# Patient Record
Sex: Female | Born: 1945 | Race: Black or African American | Hispanic: No | Smoking: Former smoker
Health system: Southern US, Community
[De-identification: ages and names within clinical notes are randomized; demographics above are authoritative.]

## PROBLEM LIST (undated history)

## (undated) DIAGNOSIS — I2510 Atherosclerotic heart disease of native coronary artery without angina pectoris: Secondary | ICD-10-CM

## (undated) DIAGNOSIS — G459 Transient cerebral ischemic attack, unspecified: Secondary | ICD-10-CM

## (undated) DIAGNOSIS — N183 Chronic kidney disease, stage 3 unspecified: Secondary | ICD-10-CM

## (undated) DIAGNOSIS — G4730 Sleep apnea, unspecified: Secondary | ICD-10-CM

## (undated) DIAGNOSIS — I214 Non-ST elevation (NSTEMI) myocardial infarction: Secondary | ICD-10-CM

## (undated) DIAGNOSIS — R252 Cramp and spasm: Secondary | ICD-10-CM

## (undated) DIAGNOSIS — I10 Essential (primary) hypertension: Secondary | ICD-10-CM

## (undated) DIAGNOSIS — E782 Mixed hyperlipidemia: Secondary | ICD-10-CM

## (undated) DIAGNOSIS — I639 Cerebral infarction, unspecified: Secondary | ICD-10-CM

## (undated) DIAGNOSIS — M1990 Unspecified osteoarthritis, unspecified site: Secondary | ICD-10-CM

## (undated) DIAGNOSIS — E119 Type 2 diabetes mellitus without complications: Secondary | ICD-10-CM

## (undated) DIAGNOSIS — I209 Angina pectoris, unspecified: Secondary | ICD-10-CM

## (undated) HISTORY — PX: KNEE ARTHROPLASTY: SHX992

## (undated) HISTORY — DX: Mixed hyperlipidemia: E78.2

## (undated) HISTORY — DX: Non-ST elevation (NSTEMI) myocardial infarction: I21.4

## (undated) HISTORY — DX: Essential (primary) hypertension: I10

## (undated) HISTORY — DX: Transient cerebral ischemic attack, unspecified: G45.9

## (undated) HISTORY — DX: Chronic kidney disease, stage 3 unspecified: N18.30

## (undated) HISTORY — PX: CHOLECYSTECTOMY: SHX55

## (undated) HISTORY — DX: Atherosclerotic heart disease of native coronary artery without angina pectoris: I25.10

## (undated) HISTORY — DX: Type 2 diabetes mellitus without complications: E11.9

## (undated) HISTORY — PX: EYE SURGERY: SHX253

## (undated) HISTORY — DX: Chronic kidney disease, stage 3 (moderate): N18.3

## (undated) HISTORY — PX: OTHER SURGICAL HISTORY: SHX169

---
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
## 2010-08-14 DIAGNOSIS — R0602 Shortness of breath: Secondary | ICD-10-CM

## 2010-11-16 DIAGNOSIS — R079 Chest pain, unspecified: Secondary | ICD-10-CM

## 2010-11-16 DIAGNOSIS — I200 Unstable angina: Secondary | ICD-10-CM

## 2010-11-17 DIAGNOSIS — I219 Acute myocardial infarction, unspecified: Secondary | ICD-10-CM

## 2010-12-02 ENCOUNTER — Encounter: Payer: Self-pay | Admitting: *Deleted

## 2010-12-04 ENCOUNTER — Encounter: Payer: No Typology Code available for payment source | Admitting: Cardiovascular Disease

## 2010-12-07 ENCOUNTER — Encounter: Payer: Self-pay | Admitting: Cardiovascular Disease

## 2010-12-18 ENCOUNTER — Encounter: Payer: Self-pay | Admitting: Cardiovascular Disease

## 2010-12-18 ENCOUNTER — Ambulatory Visit (INDEPENDENT_AMBULATORY_CARE_PROVIDER_SITE_OTHER): Payer: No Typology Code available for payment source | Admitting: Physician Assistant

## 2010-12-18 VITALS — BP 111/67 | HR 78 | Ht 66.0 in | Wt 242.0 lb

## 2010-12-18 DIAGNOSIS — E119 Type 2 diabetes mellitus without complications: Secondary | ICD-10-CM | POA: Insufficient documentation

## 2010-12-18 DIAGNOSIS — E785 Hyperlipidemia, unspecified: Secondary | ICD-10-CM | POA: Insufficient documentation

## 2010-12-18 DIAGNOSIS — N189 Chronic kidney disease, unspecified: Secondary | ICD-10-CM

## 2010-12-18 DIAGNOSIS — I10 Essential (primary) hypertension: Secondary | ICD-10-CM

## 2010-12-18 DIAGNOSIS — D649 Anemia, unspecified: Secondary | ICD-10-CM | POA: Insufficient documentation

## 2010-12-18 DIAGNOSIS — I2510 Atherosclerotic heart disease of native coronary artery without angina pectoris: Secondary | ICD-10-CM

## 2010-12-18 DIAGNOSIS — Z79899 Other long term (current) drug therapy: Secondary | ICD-10-CM

## 2010-12-18 NOTE — Progress Notes (Signed)
HPI: Patient presents for post hospital followup, and to establish with [place] here in the [place] clinic.  Patient was referred to [place] in consultation for evaluation of CP, following presentation with fever and possible TIA. She presented with no known history of CAD, and had had a normal [employer], in May of this year. Head CT scan was negative for acute changes. Cardiac markers were abnormal, with peak troponin 5.3, consistent with NSTEMI (type II). She also presented with acute/chronic renal sufficiency, with peak creatinine 2.0. 2-D echo was normal (EF 55-60%), with no focal wall motion abnormalities. Patient was treated with IV heparin, but not Plavix, in the setting of anemia. We recommended a pharmacologic nuclear imaging study, prior to discharge. This was reviewed by Dr. Andee Lineman, who noted no perfusion abnormalities; EF 68%.  Since her recent hospitalization, patient has not had any recurrent CP. She reports having undergone extensive outpatient GI evaluation, by Dr. Teena Dunk, for her anemia. She reports a negative upper endoscopy as well as colonoscopy, for evidence of overt bleeding.  Patient has been seen in followup by Dr. Olena Leatherwood. Of note, she has not had any followup post hospital blood work.  No Known Allergies  Current Outpatient Prescriptions on File Prior to Visit  Medication Sig Dispense Refill  . aspirin 81 MG tablet Take 81 mg by mouth daily.        . Choline Fenofibrate (TRILIPIX) 135 MG capsule Take 135 mg by mouth daily.        . isosorbide mononitrate (IMDUR) 30 MG 24 hr tablet Take 30 mg by mouth daily.        [name] losartan (COZAAR) 25 MG tablet Take 25 mg by mouth daily.        . metoprolol tartrate (LOPRESSOR) 25 MG tablet Take 25 mg by mouth 2 (two) times daily.        . Omega-3 Fatty Acids (FISH OIL) 1000 MG CAPS Take 1 capsule by mouth 2 (two) times daily.          No past medical history on file.  Past Surgical History  Procedure Date  . Tubal pregnancy removal       History   Social History  . Marital Status: Married    Spouse Name: N/A    Number of Children: N/A  . Years of Education: N/A   Occupational History  . Not on file.   Social History Main Topics  . Smoking status: Former Smoker -- 1.0 packs/day for 20 years    Types: Cigarettes    Quit date: 03/22/1978  . Smokeless tobacco: Never Used  . Alcohol Use: No  . Drug Use: Not on file  . Sexually Active: Not on file   Other Topics Concern  . Not on file   Social History Narrative   Has 5 children    Family History  Problem Relation Age of Onset  . Heart attack Father   . Diabetes Sister     ROS: Negative for exertional chest pain, DOE, orthopnea, PND, lower extremity edema, palpitations, presyncope/syncope, claudication, reflux, hematuria, hematochezia, or melena. Remaining systems reviewed, and are negative.   PHYSICAL EXAM:  BP 111/67  Pulse 78  Ht 5\' 6"  (1.676 m)  Wt 242 lb (109.77 kg)  BMI 39.06 kg/m2  GENERAL: 65 year old female, morbidly obese, NAD HEENT: NCAT, PERRLA, EOMI; sclera clear; no xanthelasma NECK: palpable bilateral carotid pulses, no bruits; no JVD; no TM LUNGS: CTA bilaterally CARDIAC: RRR (S1, S2); no significant murmurs; no rubs or  gallops ABDOMEN: Protuberant EXTREMETIES:  no significant peripheral edema SKIN: warm/dry; no obvious rash/lesions MUSCULOSKELETAL: no joint deformity NEURO: no focal deficit; NL affect   EKG:    ASSESSMENT & PLAN:

## 2010-12-18 NOTE — Assessment & Plan Note (Signed)
We'll order followup post hospital labs

## 2010-12-18 NOTE — Patient Instructions (Signed)
Follow up as scheduled. Your physician recommends that you continue on your current medications as directed. Please refer to the Current Medication list given to you today. Your physician recommends that you go to the [facility] for lab work: CBC/BMET

## 2010-12-18 NOTE — Assessment & Plan Note (Signed)
We'll reassess at next OV. Continue current medication regimen. Of note, patient reports having been previously treated with Crestor; currently on Trilipix/fish oil combination. Recent LDL 87, with recommended target 70 or less, if feasible.

## 2010-12-18 NOTE — Assessment & Plan Note (Signed)
No further cardiac workup recommended. Patient presents with no recurrent CP, status post recent NST EMI, in setting of fever. Peak troponin 5.3, but with normal followup pharmacologic nuclear imaging study and 2-D echocardiogram. Therefore, continue current medication regimen, which includes aspirin and beta blocker, and schedule early clinic followup with myself and Dr. Andee Lineman.

## 2010-12-18 NOTE — Assessment & Plan Note (Signed)
Well-controlled on current regimen. ?

## 2010-12-18 NOTE — Assessment & Plan Note (Signed)
We'll order followup post hospital labs, status post transfusion with 1 unit PRBCs. Reports negative outpatient GI evaluation with EGD/colonoscopy.

## 2011-03-11 ENCOUNTER — Encounter: Payer: Self-pay | Admitting: Cardiology

## 2011-03-11 ENCOUNTER — Ambulatory Visit (INDEPENDENT_AMBULATORY_CARE_PROVIDER_SITE_OTHER): Payer: No Typology Code available for payment source | Admitting: Cardiology

## 2011-03-11 VITALS — BP 102/60 | HR 66 | Ht 66.0 in | Wt 245.0 lb

## 2011-03-11 DIAGNOSIS — I2510 Atherosclerotic heart disease of native coronary artery without angina pectoris: Secondary | ICD-10-CM

## 2011-03-11 DIAGNOSIS — E119 Type 2 diabetes mellitus without complications: Secondary | ICD-10-CM

## 2011-03-11 DIAGNOSIS — I10 Essential (primary) hypertension: Secondary | ICD-10-CM

## 2011-03-11 DIAGNOSIS — N189 Chronic kidney disease, unspecified: Secondary | ICD-10-CM

## 2011-03-11 DIAGNOSIS — D649 Anemia, unspecified: Secondary | ICD-10-CM

## 2011-03-11 DIAGNOSIS — E785 Hyperlipidemia, unspecified: Secondary | ICD-10-CM

## 2011-03-11 NOTE — Patient Instructions (Signed)
   Referral to [facility] for chronic kidney disease Your physician wants you to follow up in:  1 year.  You will receive a reminder letter in the mail one-two months in advance.  If you don't receive a letter, please call our office to schedule the follow up appointment

## 2011-03-16 NOTE — Assessment & Plan Note (Signed)
The patient has renal insufficiency of unclear etiology.  Although diabetes mellitus is listed in her problem list she's not taking any medications for diabetes mellitus.  Her blood sugar was elevated on her recent blood tests.  She is also seeing now a new primary care physician.  I did refer the patient to nephrology to further establish the etiology of renal insufficiency.  She will likely need to have further testing done and she may well be a diabetic that may require treatment.  Therefore I have not stopped losartan yet although her blood pressure is relatively low and she does not have diabetes mellitus this certainly should be discontinued and reevaluate her creatinine.  However she may also need a renal ultrasound and additional blood work which we will leave up to the nephrologist.  She will need close monitoring of her renal insufficiency in the future.

## 2011-03-16 NOTE — Assessment & Plan Note (Signed)
Patient had an type II non-ST elevation myocardial infarction she is currently asymptomatic.  She has normal LV function and then had a negative myocardial perfusion study.  In particular given her renal insufficiency I have not recommended for now a cardiac catheterization.

## 2011-03-16 NOTE — Assessment & Plan Note (Signed)
Elevated blood sugar on fasting sample but not on medical therapy.  Asked the patient to discuss this with her primary care physician.

## 2011-03-16 NOTE — Assessment & Plan Note (Signed)
The patient has diabetes mellitus she will need a lipid panel and she will need to be on statin drug therapy.

## 2011-03-16 NOTE — Assessment & Plan Note (Signed)
Well-controlled, if anything on the lower side we will await for recommendations of nephrology.

## 2011-03-16 NOTE — Progress Notes (Signed)
Peyton Bottoms, MD, [facility] Board Certified in Adult Cardiovascular Medicine,Internal Medicine and Critical Care Medicine    CC: followup patient with a history of non-ST elevation myocardial infarction type II  HPI: The patient was previously seen in the clinic as followup to a hospitalization where she presented with fever and a possible TIA.  Her troponin was elevated at 5.3, but she also had acute on chronic renal insufficiency with a creatinine of 2.0.  Echocardiogram was within normal limits with an ejection fraction of 55-60% with no focal wall motion abnormalities.  She was treated with intravenous heparin but no Plavix in the setting of anemia.  A Cardiolite study was performed which showed no ischemia.  Ejection fraction was 68% consistent with the findings on echocardiogram.  Given her renal insufficiency and the fact that this was felt to be a type II non-ST elevation myocardial infarction the patient was treated medically and he recommended outpatient GI evaluation for anemia.  Reportedly she has had a negative upper as well as lower endoscopy. We reviewed her laboratory work today the patient remains anemic with hemoglobin of 10.8 with a normal MCV.  Her creatinine is also 2.11 with albumin of 35 and a stable potassium.  It appears that the patient has chronic renal insufficiency of unknown etiology.  She has no history of diabetes mellitus or hypertension.  She is not seeing a nephrologist yet.  She is currently taking losartan.     PMH: reviewed and listed in Problem List in Electronic Records (and see below) No past medical history on file.  See details outlined above  Allergies/SH/FHX : available in Electronic Records for review  No Known Allergies History   Social History  . Marital Status: Married    Spouse Name: N/A    Number of Children: N/A  . Years of Education: N/A   Occupational History  . Not on file.   Social History Main Topics  . Smoking status:  Former Smoker -- 1.0 packs/day for 20 years    Types: Cigarettes    Quit date: 03/22/1978  . Smokeless tobacco: Never Used  . Alcohol Use: No  . Drug Use: Not on file  . Sexually Active: Not on file   Other Topics Concern  . Not on file   Social History Narrative   Has 5 children   Family History  Problem Relation Age of Onset  . Heart attack Father   . Diabetes Sister     Medications: Current Outpatient Prescriptions  Medication Sig Dispense Refill  . aspirin 81 MG tablet Take 81 mg by mouth daily.        . Calcium Carb-Cholecalciferol (CALCIUM 1000 + D) 1000-800 MG-UNIT TABS Take 1 tablet by mouth 2 (two) times daily.        . Choline Fenofibrate (TRILIPIX) 135 MG capsule Take 135 mg by mouth daily.        . isosorbide mononitrate (IMDUR) 30 MG 24 hr tablet Take 30 mg by mouth daily.        [name] losartan (COZAAR) 25 MG tablet Take 25 mg by mouth daily.        . metoprolol tartrate (LOPRESSOR) 25 MG tablet Take 25 mg by mouth 2 (two) times daily.        . Omega-3 Fatty Acids (FISH OIL) 1000 MG CAPS Take 1 capsule by mouth 2 (two) times daily.          ROS: No nausea or vomiting. No fever or  chills.No melena or hematochezia.No bleeding.No claudication  Physical Exam: BP 102/60  Pulse 66  Ht 5\' 6"  (1.676 m)  Wt 245 lb (111.131 kg)  BMI 39.54 kg/m2 General:well-nourished African American female in no apparent distress. Neck:normal carotid upstroke with no carotid bruits.  No thyromegaly no nodular thyroid.  JVP is 5 cm Lungs:clear breath sounds bilaterally without wheezing. Cardiac:regular rate and rhythm with normal S1 and S2.  No pathological murmurs Vascular:no edema.  Normal dorsalis pedis and posterior tibial pulses bilaterally Skin:warm and dry. Physcologic:normal affect  12lead ECG:not obtained. Limited bedside ECHO:N/A   Assessment and Plan

## 2011-03-16 NOTE — Assessment & Plan Note (Signed)
Extensive GI workup with upper and lower endoscopy with no bleeding source.  MCV points towards anemia of chronic disease in the setting of renal insufficiency.

## 2011-03-18 ENCOUNTER — Other Ambulatory Visit: Payer: Self-pay | Admitting: *Deleted

## 2011-03-18 DIAGNOSIS — N189 Chronic kidney disease, unspecified: Secondary | ICD-10-CM

## 2011-04-12 DIAGNOSIS — M129 Arthropathy, unspecified: Secondary | ICD-10-CM | POA: Diagnosis not present

## 2011-04-12 DIAGNOSIS — I10 Essential (primary) hypertension: Secondary | ICD-10-CM | POA: Diagnosis not present

## 2011-04-12 DIAGNOSIS — I259 Chronic ischemic heart disease, unspecified: Secondary | ICD-10-CM | POA: Diagnosis not present

## 2011-04-29 DIAGNOSIS — I9589 Other hypotension: Secondary | ICD-10-CM | POA: Diagnosis not present

## 2011-04-29 DIAGNOSIS — Z87891 Personal history of nicotine dependence: Secondary | ICD-10-CM | POA: Diagnosis not present

## 2011-04-29 DIAGNOSIS — N390 Urinary tract infection, site not specified: Secondary | ICD-10-CM | POA: Diagnosis not present

## 2011-04-29 DIAGNOSIS — Z79899 Other long term (current) drug therapy: Secondary | ICD-10-CM | POA: Diagnosis not present

## 2011-04-29 DIAGNOSIS — I252 Old myocardial infarction: Secondary | ICD-10-CM | POA: Diagnosis not present

## 2011-04-29 DIAGNOSIS — M1990 Unspecified osteoarthritis, unspecified site: Secondary | ICD-10-CM | POA: Diagnosis present

## 2011-04-29 DIAGNOSIS — M069 Rheumatoid arthritis, unspecified: Secondary | ICD-10-CM | POA: Diagnosis not present

## 2011-04-29 DIAGNOSIS — Z23 Encounter for immunization: Secondary | ICD-10-CM | POA: Diagnosis not present

## 2011-04-29 DIAGNOSIS — Z6838 Body mass index (BMI) 38.0-38.9, adult: Secondary | ICD-10-CM | POA: Diagnosis not present

## 2011-04-29 DIAGNOSIS — E785 Hyperlipidemia, unspecified: Secondary | ICD-10-CM | POA: Diagnosis not present

## 2011-04-29 DIAGNOSIS — I959 Hypotension, unspecified: Secondary | ICD-10-CM | POA: Diagnosis not present

## 2011-04-29 DIAGNOSIS — E669 Obesity, unspecified: Secondary | ICD-10-CM | POA: Diagnosis present

## 2011-04-29 DIAGNOSIS — Z780 Asymptomatic menopausal state: Secondary | ICD-10-CM | POA: Diagnosis not present

## 2011-04-29 DIAGNOSIS — N289 Disorder of kidney and ureter, unspecified: Secondary | ICD-10-CM | POA: Diagnosis present

## 2011-04-29 DIAGNOSIS — Z7982 Long term (current) use of aspirin: Secondary | ICD-10-CM | POA: Diagnosis not present

## 2011-05-18 DIAGNOSIS — I10 Essential (primary) hypertension: Secondary | ICD-10-CM | POA: Diagnosis not present

## 2011-05-18 DIAGNOSIS — M159 Polyosteoarthritis, unspecified: Secondary | ICD-10-CM | POA: Diagnosis not present

## 2011-05-18 DIAGNOSIS — E782 Mixed hyperlipidemia: Secondary | ICD-10-CM | POA: Diagnosis not present

## 2011-05-18 DIAGNOSIS — J019 Acute sinusitis, unspecified: Secondary | ICD-10-CM | POA: Diagnosis not present

## 2011-05-18 DIAGNOSIS — B029 Zoster without complications: Secondary | ICD-10-CM | POA: Diagnosis not present

## 2011-05-18 DIAGNOSIS — M069 Rheumatoid arthritis, unspecified: Secondary | ICD-10-CM | POA: Diagnosis not present

## 2011-06-02 DIAGNOSIS — N281 Cyst of kidney, acquired: Secondary | ICD-10-CM | POA: Diagnosis not present

## 2011-06-02 DIAGNOSIS — E559 Vitamin D deficiency, unspecified: Secondary | ICD-10-CM | POA: Diagnosis not present

## 2011-06-02 DIAGNOSIS — Z79899 Other long term (current) drug therapy: Secondary | ICD-10-CM | POA: Diagnosis not present

## 2011-06-02 DIAGNOSIS — N189 Chronic kidney disease, unspecified: Secondary | ICD-10-CM | POA: Diagnosis not present

## 2011-06-02 DIAGNOSIS — R809 Proteinuria, unspecified: Secondary | ICD-10-CM | POA: Diagnosis not present

## 2011-06-02 DIAGNOSIS — Q6101 Congenital single renal cyst: Secondary | ICD-10-CM | POA: Diagnosis not present

## 2011-06-02 DIAGNOSIS — D649 Anemia, unspecified: Secondary | ICD-10-CM | POA: Diagnosis not present

## 2011-06-02 DIAGNOSIS — I129 Hypertensive chronic kidney disease with stage 1 through stage 4 chronic kidney disease, or unspecified chronic kidney disease: Secondary | ICD-10-CM | POA: Diagnosis not present

## 2011-06-16 DIAGNOSIS — I10 Essential (primary) hypertension: Secondary | ICD-10-CM | POA: Diagnosis not present

## 2011-06-16 DIAGNOSIS — D649 Anemia, unspecified: Secondary | ICD-10-CM | POA: Diagnosis not present

## 2011-06-16 DIAGNOSIS — E559 Vitamin D deficiency, unspecified: Secondary | ICD-10-CM | POA: Diagnosis not present

## 2011-06-22 DIAGNOSIS — M159 Polyosteoarthritis, unspecified: Secondary | ICD-10-CM | POA: Diagnosis not present

## 2011-06-22 DIAGNOSIS — E782 Mixed hyperlipidemia: Secondary | ICD-10-CM | POA: Diagnosis not present

## 2011-06-22 DIAGNOSIS — J019 Acute sinusitis, unspecified: Secondary | ICD-10-CM | POA: Diagnosis not present

## 2011-06-22 DIAGNOSIS — M069 Rheumatoid arthritis, unspecified: Secondary | ICD-10-CM | POA: Diagnosis not present

## 2011-06-22 DIAGNOSIS — B029 Zoster without complications: Secondary | ICD-10-CM | POA: Diagnosis not present

## 2011-06-22 DIAGNOSIS — I10 Essential (primary) hypertension: Secondary | ICD-10-CM | POA: Diagnosis not present

## 2011-07-23 DIAGNOSIS — D649 Anemia, unspecified: Secondary | ICD-10-CM | POA: Diagnosis not present

## 2011-09-05 DIAGNOSIS — N189 Chronic kidney disease, unspecified: Secondary | ICD-10-CM | POA: Diagnosis not present

## 2011-09-05 DIAGNOSIS — R0789 Other chest pain: Secondary | ICD-10-CM | POA: Diagnosis not present

## 2011-09-05 DIAGNOSIS — I129 Hypertensive chronic kidney disease with stage 1 through stage 4 chronic kidney disease, or unspecified chronic kidney disease: Secondary | ICD-10-CM | POA: Diagnosis not present

## 2011-09-05 DIAGNOSIS — Z833 Family history of diabetes mellitus: Secondary | ICD-10-CM | POA: Diagnosis not present

## 2011-09-05 DIAGNOSIS — Z780 Asymptomatic menopausal state: Secondary | ICD-10-CM | POA: Diagnosis not present

## 2011-09-05 DIAGNOSIS — K8020 Calculus of gallbladder without cholecystitis without obstruction: Secondary | ICD-10-CM | POA: Diagnosis not present

## 2011-09-05 DIAGNOSIS — R1310 Dysphagia, unspecified: Secondary | ICD-10-CM | POA: Diagnosis not present

## 2011-09-05 DIAGNOSIS — Z79899 Other long term (current) drug therapy: Secondary | ICD-10-CM | POA: Diagnosis not present

## 2011-09-05 DIAGNOSIS — Z6837 Body mass index (BMI) 37.0-37.9, adult: Secondary | ICD-10-CM | POA: Diagnosis not present

## 2011-09-05 DIAGNOSIS — E785 Hyperlipidemia, unspecified: Secondary | ICD-10-CM | POA: Diagnosis not present

## 2011-09-05 DIAGNOSIS — M069 Rheumatoid arthritis, unspecified: Secondary | ICD-10-CM | POA: Diagnosis not present

## 2011-09-05 DIAGNOSIS — Z7982 Long term (current) use of aspirin: Secondary | ICD-10-CM | POA: Diagnosis not present

## 2011-09-05 DIAGNOSIS — Z8249 Family history of ischemic heart disease and other diseases of the circulatory system: Secondary | ICD-10-CM | POA: Diagnosis not present

## 2011-09-05 DIAGNOSIS — R079 Chest pain, unspecified: Secondary | ICD-10-CM | POA: Diagnosis not present

## 2011-09-05 DIAGNOSIS — I252 Old myocardial infarction: Secondary | ICD-10-CM | POA: Diagnosis not present

## 2011-09-05 DIAGNOSIS — D649 Anemia, unspecified: Secondary | ICD-10-CM | POA: Diagnosis not present

## 2011-09-06 DIAGNOSIS — K8020 Calculus of gallbladder without cholecystitis without obstruction: Secondary | ICD-10-CM | POA: Diagnosis not present

## 2011-09-06 DIAGNOSIS — R079 Chest pain, unspecified: Secondary | ICD-10-CM

## 2011-09-06 DIAGNOSIS — I129 Hypertensive chronic kidney disease with stage 1 through stage 4 chronic kidney disease, or unspecified chronic kidney disease: Secondary | ICD-10-CM | POA: Diagnosis not present

## 2011-09-06 DIAGNOSIS — R0789 Other chest pain: Secondary | ICD-10-CM | POA: Diagnosis not present

## 2011-09-06 DIAGNOSIS — N281 Cyst of kidney, acquired: Secondary | ICD-10-CM | POA: Diagnosis not present

## 2011-09-06 DIAGNOSIS — R109 Unspecified abdominal pain: Secondary | ICD-10-CM | POA: Diagnosis not present

## 2011-09-06 DIAGNOSIS — M069 Rheumatoid arthritis, unspecified: Secondary | ICD-10-CM | POA: Diagnosis not present

## 2011-09-07 DIAGNOSIS — R0789 Other chest pain: Secondary | ICD-10-CM | POA: Diagnosis not present

## 2011-09-07 DIAGNOSIS — I129 Hypertensive chronic kidney disease with stage 1 through stage 4 chronic kidney disease, or unspecified chronic kidney disease: Secondary | ICD-10-CM | POA: Diagnosis not present

## 2011-09-07 DIAGNOSIS — K8020 Calculus of gallbladder without cholecystitis without obstruction: Secondary | ICD-10-CM | POA: Diagnosis not present

## 2011-09-07 DIAGNOSIS — R079 Chest pain, unspecified: Secondary | ICD-10-CM | POA: Diagnosis not present

## 2011-09-07 DIAGNOSIS — M069 Rheumatoid arthritis, unspecified: Secondary | ICD-10-CM | POA: Diagnosis not present

## 2011-09-08 DIAGNOSIS — R079 Chest pain, unspecified: Secondary | ICD-10-CM | POA: Diagnosis not present

## 2011-09-08 DIAGNOSIS — I129 Hypertensive chronic kidney disease with stage 1 through stage 4 chronic kidney disease, or unspecified chronic kidney disease: Secondary | ICD-10-CM | POA: Diagnosis not present

## 2011-09-08 DIAGNOSIS — M069 Rheumatoid arthritis, unspecified: Secondary | ICD-10-CM | POA: Diagnosis not present

## 2011-09-08 DIAGNOSIS — K8020 Calculus of gallbladder without cholecystitis without obstruction: Secondary | ICD-10-CM | POA: Diagnosis not present

## 2011-09-08 DIAGNOSIS — R0789 Other chest pain: Secondary | ICD-10-CM | POA: Diagnosis not present

## 2011-09-21 DIAGNOSIS — S838X9A Sprain of other specified parts of unspecified knee, initial encounter: Secondary | ICD-10-CM | POA: Diagnosis not present

## 2011-09-21 DIAGNOSIS — S86819A Strain of other muscle(s) and tendon(s) at lower leg level, unspecified leg, initial encounter: Secondary | ICD-10-CM | POA: Diagnosis not present

## 2011-10-25 DIAGNOSIS — K8020 Calculus of gallbladder without cholecystitis without obstruction: Secondary | ICD-10-CM | POA: Diagnosis not present

## 2011-11-01 DIAGNOSIS — J209 Acute bronchitis, unspecified: Secondary | ICD-10-CM | POA: Diagnosis not present

## 2011-11-04 DIAGNOSIS — Z79899 Other long term (current) drug therapy: Secondary | ICD-10-CM | POA: Diagnosis not present

## 2011-11-04 DIAGNOSIS — R809 Proteinuria, unspecified: Secondary | ICD-10-CM | POA: Diagnosis not present

## 2011-11-04 DIAGNOSIS — N189 Chronic kidney disease, unspecified: Secondary | ICD-10-CM | POA: Diagnosis not present

## 2011-11-04 DIAGNOSIS — I129 Hypertensive chronic kidney disease with stage 1 through stage 4 chronic kidney disease, or unspecified chronic kidney disease: Secondary | ICD-10-CM | POA: Diagnosis not present

## 2011-11-04 DIAGNOSIS — E559 Vitamin D deficiency, unspecified: Secondary | ICD-10-CM | POA: Diagnosis not present

## 2011-11-04 DIAGNOSIS — D649 Anemia, unspecified: Secondary | ICD-10-CM | POA: Diagnosis not present

## 2011-11-08 DIAGNOSIS — Z1231 Encounter for screening mammogram for malignant neoplasm of breast: Secondary | ICD-10-CM | POA: Diagnosis not present

## 2011-11-09 DIAGNOSIS — D509 Iron deficiency anemia, unspecified: Secondary | ICD-10-CM | POA: Diagnosis not present

## 2011-11-09 DIAGNOSIS — I10 Essential (primary) hypertension: Secondary | ICD-10-CM | POA: Diagnosis not present

## 2011-11-17 DIAGNOSIS — N183 Chronic kidney disease, stage 3 unspecified: Secondary | ICD-10-CM | POA: Diagnosis not present

## 2011-11-17 DIAGNOSIS — D631 Anemia in chronic kidney disease: Secondary | ICD-10-CM | POA: Diagnosis not present

## 2011-11-24 DIAGNOSIS — D631 Anemia in chronic kidney disease: Secondary | ICD-10-CM | POA: Diagnosis not present

## 2011-11-24 DIAGNOSIS — N183 Chronic kidney disease, stage 3 unspecified: Secondary | ICD-10-CM | POA: Diagnosis not present

## 2011-12-07 DIAGNOSIS — Z23 Encounter for immunization: Secondary | ICD-10-CM | POA: Diagnosis not present

## 2011-12-07 DIAGNOSIS — IMO0002 Reserved for concepts with insufficient information to code with codable children: Secondary | ICD-10-CM | POA: Diagnosis not present

## 2011-12-28 DIAGNOSIS — I10 Essential (primary) hypertension: Secondary | ICD-10-CM | POA: Diagnosis not present

## 2012-02-21 DIAGNOSIS — E559 Vitamin D deficiency, unspecified: Secondary | ICD-10-CM | POA: Diagnosis not present

## 2012-02-21 DIAGNOSIS — N189 Chronic kidney disease, unspecified: Secondary | ICD-10-CM | POA: Diagnosis not present

## 2012-02-21 DIAGNOSIS — Z79899 Other long term (current) drug therapy: Secondary | ICD-10-CM | POA: Diagnosis not present

## 2012-02-21 DIAGNOSIS — R809 Proteinuria, unspecified: Secondary | ICD-10-CM | POA: Diagnosis not present

## 2012-02-21 DIAGNOSIS — D649 Anemia, unspecified: Secondary | ICD-10-CM | POA: Diagnosis not present

## 2012-02-21 DIAGNOSIS — I129 Hypertensive chronic kidney disease with stage 1 through stage 4 chronic kidney disease, or unspecified chronic kidney disease: Secondary | ICD-10-CM | POA: Diagnosis not present

## 2012-02-23 DIAGNOSIS — D649 Anemia, unspecified: Secondary | ICD-10-CM | POA: Diagnosis not present

## 2012-02-23 DIAGNOSIS — I10 Essential (primary) hypertension: Secondary | ICD-10-CM | POA: Diagnosis not present

## 2012-03-13 DIAGNOSIS — H40059 Ocular hypertension, unspecified eye: Secondary | ICD-10-CM | POA: Diagnosis not present

## 2012-03-17 ENCOUNTER — Telehealth: Payer: Self-pay | Admitting: Cardiology

## 2012-03-17 NOTE — Telephone Encounter (Signed)
Attempted to call patient to schedule Recall for December 2013.  Left message for patient to call us back.

## 2012-04-04 DIAGNOSIS — M76899 Other specified enthesopathies of unspecified lower limb, excluding foot: Secondary | ICD-10-CM | POA: Diagnosis not present

## 2012-04-25 DIAGNOSIS — M1710 Unilateral primary osteoarthritis, unspecified knee: Secondary | ICD-10-CM | POA: Diagnosis not present

## 2012-05-17 ENCOUNTER — Ambulatory Visit (INDEPENDENT_AMBULATORY_CARE_PROVIDER_SITE_OTHER): Payer: Medicare Other | Admitting: Cardiology

## 2012-05-17 ENCOUNTER — Encounter: Payer: Self-pay | Admitting: Cardiology

## 2012-05-17 VITALS — BP 96/56 | HR 81 | Ht 66.0 in | Wt 239.0 lb

## 2012-05-17 DIAGNOSIS — I2510 Atherosclerotic heart disease of native coronary artery without angina pectoris: Secondary | ICD-10-CM | POA: Diagnosis not present

## 2012-05-17 DIAGNOSIS — E785 Hyperlipidemia, unspecified: Secondary | ICD-10-CM

## 2012-05-17 DIAGNOSIS — I10 Essential (primary) hypertension: Secondary | ICD-10-CM | POA: Diagnosis not present

## 2012-05-17 DIAGNOSIS — Z01818 Encounter for other preprocedural examination: Secondary | ICD-10-CM | POA: Insufficient documentation

## 2012-05-17 DIAGNOSIS — I25810 Atherosclerosis of coronary artery bypass graft(s) without angina pectoris: Secondary | ICD-10-CM

## 2012-05-17 NOTE — Progress Notes (Signed)
Clinical Summary  Ms. Joshi is a 67 y.o.female referred back to the office for preoperative evaluation. She is a former patient of Dr. Andee Lineman, last seen in December 2012. She has been managed medically for possible underlying CAD with history of type II NSTEMI in the past. Cardiolite from August 2012 revealed LVEF 68% without evidence of ischemia. Invasive cardiac workup was not pursued in light of chronic kidney disease.  Lab work from December 2013 revealed BUN 28, creatinine 1.6, GFR 32, potassium 3.7, hemoglobin 12.3. She is followed by Dr. Kristian Covey.  She reports no angina symptoms, NYHA class II dyspnea. 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 She will be having elective right knee replacement with Dr. Case in the near future.  ECG today shows normal sinus rhythm.  No Known Allergies  Current Outpatient Prescriptions  Medication Sig Dispense Refill  . aspirin 81 MG tablet Take 81 mg by mouth daily.        . Calcium Carb-Cholecalciferol (CALCIUM 1000 + D) 1000-800 MG-UNIT TABS Take 1 tablet by mouth daily.       . Choline Fenofibrate (TRILIPIX) 135 MG capsule Take 135 mg by mouth daily.        . fentaNYL (DURAGESIC - DOSED MCG/HR) 25 MCG/HR Place 1 patch onto the skin every 3 (three) days.      . folic acid (FOLVITE) 1 MG tablet Take 1 mg by mouth daily.      . isosorbide dinitrate (ISORDIL) 30 MG tablet Take 30 mg by mouth 2 (two) times daily.      [name] losartan (COZAAR) 25 MG tablet Take 25 mg by mouth daily.        . metaxalone (SKELAXIN) 800 MG tablet Take 800 mg by mouth 3 (three) times daily.      . metoprolol succinate (TOPROL-XL) 25 MG 24 hr tablet Take 25 mg by mouth daily.      . Omega-3 Fatty Acids (FISH OIL) 1000 MG CAPS Take 1 capsule by mouth daily.       . valACYclovir (VALTREX) 500 MG tablet Take 500 mg by mouth 2 (two) times daily.       No current facility-administered medications for this visit.    Past  Medical History  Diagnosis Date  . NSTEMI (non-ST elevated myocardial infarction)     Probable type II event, negative Cardiolite  . Essential hypertension, benign   . Mixed hyperlipidemia   . Type 2 diabetes mellitus   . TIA (transient ischemic attack)   . CKD (chronic kidney disease) stage 3, GFR 30-59 ml/min     Dr. Kristian Covey    Social History Ms. Derrick reports that she quit smoking about 34 years ago. Her smoking use included Cigarettes. She has a 20 pack-year smoking history. She has never used smokeless tobacco. Ms. Nakamura reports that she does not drink alcohol.  Review of Systems No palpitations, orthopnea, PND. No fevers or chills. Stable appetite. Otherwise negative.  Physical Examination Filed Vitals:   05/17/12 1404  BP: 96/56  Pulse: 81   Filed Weights   05/17/12 1404  Weight: 239 lb (108.41 kg)   No acute distress. HEENT: Conjunctiva and lids normal, oropharynx clear. Neck: Supple, no elevated JVP or carotid bruits, no thyromegaly. Lungs: Clear to auscultation, nonlabored breathing at rest. Cardiac: Regular rate and rhythm, no S3 or significant systolic murmur, no pericardial rub. Abdomen: Soft, nontender,bowel sounds present, no guarding or rebound. Extremities: No pitting edema, distal pulses  1-2+. Skin: Warm and dry. Musculoskeletal: No kyphosis. Neuropsychiatric: Alert and oriented x3, affect grossly appropriate.   Problem List and Plan   Preoperative evaluation to rule out surgical contraindication Patient being considered for elective right knee replacement under general anesthesia. From a cardiac perspective she has been clinically stable. Cardiolite within the last 2 years was normal, and she is on reasonable medical therapy with good blood pressure heart and rate control. Do not anticipate any further cardiac testing at this time, and expect that she will be able to proceed with planned surgery at an acceptable perioperative cardiac  risk.  Essential hypertension, benign Blood pressure very well controlled today.  Coronary atherosclerosis of native coronary artery Possible diagnosis with previous history of type II NSTEMI in August 2012, although with normal Cardiolite at that time and no major ischemic burden. Medical therapy continues.  Dyslipidemia She continues on Trilipix and omega-3 supplements, followed by Dr. Olena Leatherwood.  CKD (chronic kidney disease) stage 3, GFR 30-59 ml/min Followed by Dr. Kristian Covey, recent lab work showing creatinine 1.6 with GFR 32.    Jonelle Sidle, M.D., F.A.C.C.

## 2012-05-17 NOTE — Assessment & Plan Note (Signed)
Blood pressure very well controlled today. 

## 2012-05-17 NOTE — Assessment & Plan Note (Signed)
She continues on Trilipix and omega-3 supplements, followed by Dr. Olena Leatherwood.

## 2012-05-17 NOTE — Assessment & Plan Note (Signed)
Followed by Dr. Kristian Covey, recent lab work showing creatinine 1.6 with GFR 32.

## 2012-05-17 NOTE — Patient Instructions (Addendum)

## 2012-05-17 NOTE — Assessment & Plan Note (Signed)
Possible diagnosis with previous history of type II NSTEMI in August 2012, although with normal Cardiolite at that time and no major ischemic burden. Medical therapy continues.

## 2012-05-17 NOTE — Assessment & Plan Note (Signed)
Patient being considered for elective right knee replacement under general anesthesia. From a cardiac perspective she has been clinically stable. Cardiolite within the last 2 years was normal, and she is on reasonable medical therapy with good blood pressure heart and rate control. Do not anticipate any further cardiac testing at this time, and expect that she will be able to proceed with planned surgery at an acceptable perioperative cardiac risk.

## 2012-06-06 DIAGNOSIS — M1710 Unilateral primary osteoarthritis, unspecified knee: Secondary | ICD-10-CM | POA: Diagnosis not present

## 2012-06-07 DIAGNOSIS — M1710 Unilateral primary osteoarthritis, unspecified knee: Secondary | ICD-10-CM | POA: Diagnosis not present

## 2012-06-07 DIAGNOSIS — Z7982 Long term (current) use of aspirin: Secondary | ICD-10-CM | POA: Diagnosis not present

## 2012-06-07 DIAGNOSIS — R030 Elevated blood-pressure reading, without diagnosis of hypertension: Secondary | ICD-10-CM | POA: Diagnosis not present

## 2012-06-07 DIAGNOSIS — Z01818 Encounter for other preprocedural examination: Secondary | ICD-10-CM | POA: Diagnosis not present

## 2012-06-07 DIAGNOSIS — Z79899 Other long term (current) drug therapy: Secondary | ICD-10-CM | POA: Diagnosis not present

## 2012-06-07 DIAGNOSIS — I252 Old myocardial infarction: Secondary | ICD-10-CM | POA: Diagnosis not present

## 2012-06-13 DIAGNOSIS — Z471 Aftercare following joint replacement surgery: Secondary | ICD-10-CM | POA: Diagnosis not present

## 2012-06-13 DIAGNOSIS — IMO0002 Reserved for concepts with insufficient information to code with codable children: Secondary | ICD-10-CM | POA: Diagnosis not present

## 2012-06-13 DIAGNOSIS — N183 Chronic kidney disease, stage 3 unspecified: Secondary | ICD-10-CM | POA: Diagnosis not present

## 2012-06-13 DIAGNOSIS — Z7982 Long term (current) use of aspirin: Secondary | ICD-10-CM | POA: Diagnosis not present

## 2012-06-13 DIAGNOSIS — Z6839 Body mass index (BMI) 39.0-39.9, adult: Secondary | ICD-10-CM | POA: Diagnosis not present

## 2012-06-13 DIAGNOSIS — I252 Old myocardial infarction: Secondary | ICD-10-CM | POA: Diagnosis not present

## 2012-06-13 DIAGNOSIS — E785 Hyperlipidemia, unspecified: Secondary | ICD-10-CM | POA: Diagnosis not present

## 2012-06-13 DIAGNOSIS — Z8673 Personal history of transient ischemic attack (TIA), and cerebral infarction without residual deficits: Secondary | ICD-10-CM | POA: Diagnosis not present

## 2012-06-13 DIAGNOSIS — M1710 Unilateral primary osteoarthritis, unspecified knee: Secondary | ICD-10-CM | POA: Diagnosis not present

## 2012-06-13 DIAGNOSIS — M069 Rheumatoid arthritis, unspecified: Secondary | ICD-10-CM | POA: Diagnosis not present

## 2012-06-13 DIAGNOSIS — I2510 Atherosclerotic heart disease of native coronary artery without angina pectoris: Secondary | ICD-10-CM | POA: Diagnosis not present

## 2012-06-13 DIAGNOSIS — Z96659 Presence of unspecified artificial knee joint: Secondary | ICD-10-CM | POA: Diagnosis not present

## 2012-06-13 DIAGNOSIS — E669 Obesity, unspecified: Secondary | ICD-10-CM | POA: Diagnosis not present

## 2012-06-13 DIAGNOSIS — I129 Hypertensive chronic kidney disease with stage 1 through stage 4 chronic kidney disease, or unspecified chronic kidney disease: Secondary | ICD-10-CM | POA: Diagnosis not present

## 2012-06-13 DIAGNOSIS — Z79899 Other long term (current) drug therapy: Secondary | ICD-10-CM | POA: Diagnosis not present

## 2012-06-13 DIAGNOSIS — Z780 Asymptomatic menopausal state: Secondary | ICD-10-CM | POA: Diagnosis not present

## 2012-06-16 DIAGNOSIS — Z96659 Presence of unspecified artificial knee joint: Secondary | ICD-10-CM | POA: Diagnosis not present

## 2012-06-16 DIAGNOSIS — M6281 Muscle weakness (generalized): Secondary | ICD-10-CM | POA: Diagnosis not present

## 2012-06-16 DIAGNOSIS — IMO0001 Reserved for inherently not codable concepts without codable children: Secondary | ICD-10-CM | POA: Diagnosis not present

## 2012-06-16 DIAGNOSIS — R262 Difficulty in walking, not elsewhere classified: Secondary | ICD-10-CM | POA: Diagnosis not present

## 2012-06-19 DIAGNOSIS — Z96659 Presence of unspecified artificial knee joint: Secondary | ICD-10-CM | POA: Diagnosis not present

## 2012-06-19 DIAGNOSIS — M6281 Muscle weakness (generalized): Secondary | ICD-10-CM | POA: Diagnosis not present

## 2012-06-19 DIAGNOSIS — R262 Difficulty in walking, not elsewhere classified: Secondary | ICD-10-CM | POA: Diagnosis not present

## 2012-06-19 DIAGNOSIS — IMO0001 Reserved for inherently not codable concepts without codable children: Secondary | ICD-10-CM | POA: Diagnosis not present

## 2012-06-21 DIAGNOSIS — IMO0001 Reserved for inherently not codable concepts without codable children: Secondary | ICD-10-CM | POA: Diagnosis not present

## 2012-06-21 DIAGNOSIS — M6281 Muscle weakness (generalized): Secondary | ICD-10-CM | POA: Diagnosis not present

## 2012-06-21 DIAGNOSIS — R262 Difficulty in walking, not elsewhere classified: Secondary | ICD-10-CM | POA: Diagnosis not present

## 2012-06-22 DIAGNOSIS — Z96659 Presence of unspecified artificial knee joint: Secondary | ICD-10-CM | POA: Diagnosis not present

## 2012-06-22 DIAGNOSIS — M1710 Unilateral primary osteoarthritis, unspecified knee: Secondary | ICD-10-CM | POA: Diagnosis not present

## 2012-06-23 DIAGNOSIS — R262 Difficulty in walking, not elsewhere classified: Secondary | ICD-10-CM | POA: Diagnosis not present

## 2012-06-23 DIAGNOSIS — IMO0001 Reserved for inherently not codable concepts without codable children: Secondary | ICD-10-CM | POA: Diagnosis not present

## 2012-06-23 DIAGNOSIS — M6281 Muscle weakness (generalized): Secondary | ICD-10-CM | POA: Diagnosis not present

## 2012-06-23 DIAGNOSIS — N3000 Acute cystitis without hematuria: Secondary | ICD-10-CM | POA: Diagnosis not present

## 2012-06-26 DIAGNOSIS — M6281 Muscle weakness (generalized): Secondary | ICD-10-CM | POA: Diagnosis not present

## 2012-06-26 DIAGNOSIS — IMO0001 Reserved for inherently not codable concepts without codable children: Secondary | ICD-10-CM | POA: Diagnosis not present

## 2012-06-26 DIAGNOSIS — R262 Difficulty in walking, not elsewhere classified: Secondary | ICD-10-CM | POA: Diagnosis not present

## 2012-06-28 DIAGNOSIS — M6281 Muscle weakness (generalized): Secondary | ICD-10-CM | POA: Diagnosis not present

## 2012-06-28 DIAGNOSIS — IMO0001 Reserved for inherently not codable concepts without codable children: Secondary | ICD-10-CM | POA: Diagnosis not present

## 2012-06-28 DIAGNOSIS — R262 Difficulty in walking, not elsewhere classified: Secondary | ICD-10-CM | POA: Diagnosis not present

## 2012-06-30 DIAGNOSIS — M6281 Muscle weakness (generalized): Secondary | ICD-10-CM | POA: Diagnosis not present

## 2012-06-30 DIAGNOSIS — R262 Difficulty in walking, not elsewhere classified: Secondary | ICD-10-CM | POA: Diagnosis not present

## 2012-06-30 DIAGNOSIS — IMO0001 Reserved for inherently not codable concepts without codable children: Secondary | ICD-10-CM | POA: Diagnosis not present

## 2012-07-03 DIAGNOSIS — M6281 Muscle weakness (generalized): Secondary | ICD-10-CM | POA: Diagnosis not present

## 2012-07-03 DIAGNOSIS — R262 Difficulty in walking, not elsewhere classified: Secondary | ICD-10-CM | POA: Diagnosis not present

## 2012-07-03 DIAGNOSIS — IMO0001 Reserved for inherently not codable concepts without codable children: Secondary | ICD-10-CM | POA: Diagnosis not present

## 2012-07-05 DIAGNOSIS — M6281 Muscle weakness (generalized): Secondary | ICD-10-CM | POA: Diagnosis not present

## 2012-07-05 DIAGNOSIS — IMO0001 Reserved for inherently not codable concepts without codable children: Secondary | ICD-10-CM | POA: Diagnosis not present

## 2012-07-05 DIAGNOSIS — R262 Difficulty in walking, not elsewhere classified: Secondary | ICD-10-CM | POA: Diagnosis not present

## 2012-07-06 DIAGNOSIS — M6281 Muscle weakness (generalized): Secondary | ICD-10-CM | POA: Diagnosis not present

## 2012-07-06 DIAGNOSIS — IMO0001 Reserved for inherently not codable concepts without codable children: Secondary | ICD-10-CM | POA: Diagnosis not present

## 2012-07-06 DIAGNOSIS — R262 Difficulty in walking, not elsewhere classified: Secondary | ICD-10-CM | POA: Diagnosis not present

## 2012-07-10 DIAGNOSIS — R262 Difficulty in walking, not elsewhere classified: Secondary | ICD-10-CM | POA: Diagnosis not present

## 2012-07-10 DIAGNOSIS — IMO0001 Reserved for inherently not codable concepts without codable children: Secondary | ICD-10-CM | POA: Diagnosis not present

## 2012-07-10 DIAGNOSIS — M6281 Muscle weakness (generalized): Secondary | ICD-10-CM | POA: Diagnosis not present

## 2012-07-12 DIAGNOSIS — M6281 Muscle weakness (generalized): Secondary | ICD-10-CM | POA: Diagnosis not present

## 2012-07-12 DIAGNOSIS — R262 Difficulty in walking, not elsewhere classified: Secondary | ICD-10-CM | POA: Diagnosis not present

## 2012-07-12 DIAGNOSIS — IMO0001 Reserved for inherently not codable concepts without codable children: Secondary | ICD-10-CM | POA: Diagnosis not present

## 2012-07-14 DIAGNOSIS — IMO0001 Reserved for inherently not codable concepts without codable children: Secondary | ICD-10-CM | POA: Diagnosis not present

## 2012-07-14 DIAGNOSIS — M6281 Muscle weakness (generalized): Secondary | ICD-10-CM | POA: Diagnosis not present

## 2012-07-14 DIAGNOSIS — R262 Difficulty in walking, not elsewhere classified: Secondary | ICD-10-CM | POA: Diagnosis not present

## 2012-07-17 DIAGNOSIS — M6281 Muscle weakness (generalized): Secondary | ICD-10-CM | POA: Diagnosis not present

## 2012-07-17 DIAGNOSIS — IMO0001 Reserved for inherently not codable concepts without codable children: Secondary | ICD-10-CM | POA: Diagnosis not present

## 2012-07-17 DIAGNOSIS — R262 Difficulty in walking, not elsewhere classified: Secondary | ICD-10-CM | POA: Diagnosis not present

## 2012-07-21 DIAGNOSIS — Z96659 Presence of unspecified artificial knee joint: Secondary | ICD-10-CM | POA: Diagnosis not present

## 2012-07-21 DIAGNOSIS — IMO0001 Reserved for inherently not codable concepts without codable children: Secondary | ICD-10-CM | POA: Diagnosis not present

## 2012-07-21 DIAGNOSIS — M6281 Muscle weakness (generalized): Secondary | ICD-10-CM | POA: Diagnosis not present

## 2012-07-21 DIAGNOSIS — R262 Difficulty in walking, not elsewhere classified: Secondary | ICD-10-CM | POA: Diagnosis not present

## 2012-07-24 DIAGNOSIS — IMO0001 Reserved for inherently not codable concepts without codable children: Secondary | ICD-10-CM | POA: Diagnosis not present

## 2012-07-24 DIAGNOSIS — M6281 Muscle weakness (generalized): Secondary | ICD-10-CM | POA: Diagnosis not present

## 2012-07-24 DIAGNOSIS — Z96659 Presence of unspecified artificial knee joint: Secondary | ICD-10-CM | POA: Diagnosis not present

## 2012-07-24 DIAGNOSIS — R262 Difficulty in walking, not elsewhere classified: Secondary | ICD-10-CM | POA: Diagnosis not present

## 2012-07-26 DIAGNOSIS — M6281 Muscle weakness (generalized): Secondary | ICD-10-CM | POA: Diagnosis not present

## 2012-07-26 DIAGNOSIS — R262 Difficulty in walking, not elsewhere classified: Secondary | ICD-10-CM | POA: Diagnosis not present

## 2012-07-26 DIAGNOSIS — IMO0001 Reserved for inherently not codable concepts without codable children: Secondary | ICD-10-CM | POA: Diagnosis not present

## 2012-07-26 DIAGNOSIS — Z96659 Presence of unspecified artificial knee joint: Secondary | ICD-10-CM | POA: Diagnosis not present

## 2012-07-27 DIAGNOSIS — M1710 Unilateral primary osteoarthritis, unspecified knee: Secondary | ICD-10-CM | POA: Diagnosis not present

## 2012-07-27 DIAGNOSIS — Z96659 Presence of unspecified artificial knee joint: Secondary | ICD-10-CM | POA: Diagnosis not present

## 2012-07-28 DIAGNOSIS — M6281 Muscle weakness (generalized): Secondary | ICD-10-CM | POA: Diagnosis not present

## 2012-07-28 DIAGNOSIS — IMO0001 Reserved for inherently not codable concepts without codable children: Secondary | ICD-10-CM | POA: Diagnosis not present

## 2012-07-28 DIAGNOSIS — R262 Difficulty in walking, not elsewhere classified: Secondary | ICD-10-CM | POA: Diagnosis not present

## 2012-07-28 DIAGNOSIS — Z96659 Presence of unspecified artificial knee joint: Secondary | ICD-10-CM | POA: Diagnosis not present

## 2012-07-31 DIAGNOSIS — Z96659 Presence of unspecified artificial knee joint: Secondary | ICD-10-CM | POA: Diagnosis not present

## 2012-07-31 DIAGNOSIS — IMO0001 Reserved for inherently not codable concepts without codable children: Secondary | ICD-10-CM | POA: Diagnosis not present

## 2012-07-31 DIAGNOSIS — R262 Difficulty in walking, not elsewhere classified: Secondary | ICD-10-CM | POA: Diagnosis not present

## 2012-07-31 DIAGNOSIS — M6281 Muscle weakness (generalized): Secondary | ICD-10-CM | POA: Diagnosis not present

## 2012-08-02 DIAGNOSIS — R262 Difficulty in walking, not elsewhere classified: Secondary | ICD-10-CM | POA: Diagnosis not present

## 2012-08-02 DIAGNOSIS — Z96659 Presence of unspecified artificial knee joint: Secondary | ICD-10-CM | POA: Diagnosis not present

## 2012-08-02 DIAGNOSIS — IMO0001 Reserved for inherently not codable concepts without codable children: Secondary | ICD-10-CM | POA: Diagnosis not present

## 2012-08-02 DIAGNOSIS — M6281 Muscle weakness (generalized): Secondary | ICD-10-CM | POA: Diagnosis not present

## 2012-08-04 DIAGNOSIS — IMO0001 Reserved for inherently not codable concepts without codable children: Secondary | ICD-10-CM | POA: Diagnosis not present

## 2012-08-04 DIAGNOSIS — R262 Difficulty in walking, not elsewhere classified: Secondary | ICD-10-CM | POA: Diagnosis not present

## 2012-08-04 DIAGNOSIS — M6281 Muscle weakness (generalized): Secondary | ICD-10-CM | POA: Diagnosis not present

## 2012-08-04 DIAGNOSIS — Z96659 Presence of unspecified artificial knee joint: Secondary | ICD-10-CM | POA: Diagnosis not present

## 2012-08-07 DIAGNOSIS — Z96659 Presence of unspecified artificial knee joint: Secondary | ICD-10-CM | POA: Diagnosis not present

## 2012-08-07 DIAGNOSIS — IMO0001 Reserved for inherently not codable concepts without codable children: Secondary | ICD-10-CM | POA: Diagnosis not present

## 2012-08-07 DIAGNOSIS — R262 Difficulty in walking, not elsewhere classified: Secondary | ICD-10-CM | POA: Diagnosis not present

## 2012-08-07 DIAGNOSIS — M6281 Muscle weakness (generalized): Secondary | ICD-10-CM | POA: Diagnosis not present

## 2012-08-09 DIAGNOSIS — M6281 Muscle weakness (generalized): Secondary | ICD-10-CM | POA: Diagnosis not present

## 2012-08-09 DIAGNOSIS — R262 Difficulty in walking, not elsewhere classified: Secondary | ICD-10-CM | POA: Diagnosis not present

## 2012-08-09 DIAGNOSIS — IMO0001 Reserved for inherently not codable concepts without codable children: Secondary | ICD-10-CM | POA: Diagnosis not present

## 2012-08-09 DIAGNOSIS — Z96659 Presence of unspecified artificial knee joint: Secondary | ICD-10-CM | POA: Diagnosis not present

## 2012-08-11 DIAGNOSIS — Z96659 Presence of unspecified artificial knee joint: Secondary | ICD-10-CM | POA: Diagnosis not present

## 2012-08-11 DIAGNOSIS — M6281 Muscle weakness (generalized): Secondary | ICD-10-CM | POA: Diagnosis not present

## 2012-08-11 DIAGNOSIS — IMO0001 Reserved for inherently not codable concepts without codable children: Secondary | ICD-10-CM | POA: Diagnosis not present

## 2012-08-11 DIAGNOSIS — R262 Difficulty in walking, not elsewhere classified: Secondary | ICD-10-CM | POA: Diagnosis not present

## 2012-08-24 DIAGNOSIS — I10 Essential (primary) hypertension: Secondary | ICD-10-CM | POA: Diagnosis not present

## 2012-08-27 DIAGNOSIS — J019 Acute sinusitis, unspecified: Secondary | ICD-10-CM | POA: Diagnosis not present

## 2012-08-27 DIAGNOSIS — R51 Headache: Secondary | ICD-10-CM | POA: Diagnosis not present

## 2012-08-27 DIAGNOSIS — Z79899 Other long term (current) drug therapy: Secondary | ICD-10-CM | POA: Diagnosis not present

## 2012-08-27 DIAGNOSIS — J329 Chronic sinusitis, unspecified: Secondary | ICD-10-CM | POA: Diagnosis not present

## 2012-08-27 DIAGNOSIS — I10 Essential (primary) hypertension: Secondary | ICD-10-CM | POA: Diagnosis not present

## 2012-08-27 DIAGNOSIS — Z7982 Long term (current) use of aspirin: Secondary | ICD-10-CM | POA: Diagnosis not present

## 2012-08-31 DIAGNOSIS — I129 Hypertensive chronic kidney disease with stage 1 through stage 4 chronic kidney disease, or unspecified chronic kidney disease: Secondary | ICD-10-CM | POA: Diagnosis not present

## 2012-08-31 DIAGNOSIS — R809 Proteinuria, unspecified: Secondary | ICD-10-CM | POA: Diagnosis not present

## 2012-08-31 DIAGNOSIS — E559 Vitamin D deficiency, unspecified: Secondary | ICD-10-CM | POA: Diagnosis not present

## 2012-08-31 DIAGNOSIS — D649 Anemia, unspecified: Secondary | ICD-10-CM | POA: Diagnosis not present

## 2012-08-31 DIAGNOSIS — N189 Chronic kidney disease, unspecified: Secondary | ICD-10-CM | POA: Diagnosis not present

## 2012-08-31 DIAGNOSIS — Z79899 Other long term (current) drug therapy: Secondary | ICD-10-CM | POA: Diagnosis not present

## 2012-09-05 DIAGNOSIS — E559 Vitamin D deficiency, unspecified: Secondary | ICD-10-CM | POA: Diagnosis not present

## 2012-09-05 DIAGNOSIS — I10 Essential (primary) hypertension: Secondary | ICD-10-CM | POA: Diagnosis not present

## 2012-09-05 DIAGNOSIS — D649 Anemia, unspecified: Secondary | ICD-10-CM | POA: Diagnosis not present

## 2012-09-07 DIAGNOSIS — M1710 Unilateral primary osteoarthritis, unspecified knee: Secondary | ICD-10-CM | POA: Diagnosis not present

## 2012-11-08 DIAGNOSIS — Z79899 Other long term (current) drug therapy: Secondary | ICD-10-CM | POA: Diagnosis not present

## 2012-11-08 DIAGNOSIS — R809 Proteinuria, unspecified: Secondary | ICD-10-CM | POA: Diagnosis not present

## 2012-11-08 DIAGNOSIS — E559 Vitamin D deficiency, unspecified: Secondary | ICD-10-CM | POA: Diagnosis not present

## 2012-11-08 DIAGNOSIS — N189 Chronic kidney disease, unspecified: Secondary | ICD-10-CM | POA: Diagnosis not present

## 2012-11-08 DIAGNOSIS — I129 Hypertensive chronic kidney disease with stage 1 through stage 4 chronic kidney disease, or unspecified chronic kidney disease: Secondary | ICD-10-CM | POA: Diagnosis not present

## 2012-11-08 DIAGNOSIS — D649 Anemia, unspecified: Secondary | ICD-10-CM | POA: Diagnosis not present

## 2012-11-09 DIAGNOSIS — Z01419 Encounter for gynecological examination (general) (routine) without abnormal findings: Secondary | ICD-10-CM | POA: Diagnosis not present

## 2012-11-09 DIAGNOSIS — Z1231 Encounter for screening mammogram for malignant neoplasm of breast: Secondary | ICD-10-CM | POA: Diagnosis not present

## 2012-11-09 DIAGNOSIS — B373 Candidiasis of vulva and vagina: Secondary | ICD-10-CM | POA: Diagnosis not present

## 2012-11-09 DIAGNOSIS — N3945 Continuous leakage: Secondary | ICD-10-CM | POA: Diagnosis not present

## 2012-11-21 DIAGNOSIS — I10 Essential (primary) hypertension: Secondary | ICD-10-CM | POA: Diagnosis not present

## 2012-12-02 DIAGNOSIS — E559 Vitamin D deficiency, unspecified: Secondary | ICD-10-CM | POA: Diagnosis not present

## 2012-12-02 DIAGNOSIS — I10 Essential (primary) hypertension: Secondary | ICD-10-CM | POA: Diagnosis not present

## 2012-12-02 DIAGNOSIS — Z7189 Other specified counseling: Secondary | ICD-10-CM | POA: Diagnosis not present

## 2012-12-06 DIAGNOSIS — Z23 Encounter for immunization: Secondary | ICD-10-CM | POA: Diagnosis not present

## 2012-12-07 DIAGNOSIS — Z8673 Personal history of transient ischemic attack (TIA), and cerebral infarction without residual deficits: Secondary | ICD-10-CM | POA: Diagnosis not present

## 2012-12-07 DIAGNOSIS — I252 Old myocardial infarction: Secondary | ICD-10-CM | POA: Diagnosis not present

## 2012-12-07 DIAGNOSIS — Z7982 Long term (current) use of aspirin: Secondary | ICD-10-CM | POA: Diagnosis not present

## 2012-12-07 DIAGNOSIS — N189 Chronic kidney disease, unspecified: Secondary | ICD-10-CM | POA: Diagnosis not present

## 2012-12-07 DIAGNOSIS — M1710 Unilateral primary osteoarthritis, unspecified knee: Secondary | ICD-10-CM | POA: Diagnosis not present

## 2012-12-07 DIAGNOSIS — I2510 Atherosclerotic heart disease of native coronary artery without angina pectoris: Secondary | ICD-10-CM | POA: Diagnosis not present

## 2012-12-07 DIAGNOSIS — I129 Hypertensive chronic kidney disease with stage 1 through stage 4 chronic kidney disease, or unspecified chronic kidney disease: Secondary | ICD-10-CM | POA: Diagnosis not present

## 2012-12-07 DIAGNOSIS — Z01818 Encounter for other preprocedural examination: Secondary | ICD-10-CM | POA: Diagnosis not present

## 2012-12-07 DIAGNOSIS — E669 Obesity, unspecified: Secondary | ICD-10-CM | POA: Diagnosis not present

## 2012-12-07 DIAGNOSIS — Z96659 Presence of unspecified artificial knee joint: Secondary | ICD-10-CM | POA: Diagnosis not present

## 2012-12-07 DIAGNOSIS — Z79899 Other long term (current) drug therapy: Secondary | ICD-10-CM | POA: Diagnosis not present

## 2012-12-12 DIAGNOSIS — Z96659 Presence of unspecified artificial knee joint: Secondary | ICD-10-CM | POA: Diagnosis not present

## 2012-12-12 DIAGNOSIS — E669 Obesity, unspecified: Secondary | ICD-10-CM | POA: Diagnosis not present

## 2012-12-12 DIAGNOSIS — M25569 Pain in unspecified knee: Secondary | ICD-10-CM | POA: Diagnosis not present

## 2012-12-12 DIAGNOSIS — N183 Chronic kidney disease, stage 3 unspecified: Secondary | ICD-10-CM | POA: Diagnosis not present

## 2012-12-12 DIAGNOSIS — E785 Hyperlipidemia, unspecified: Secondary | ICD-10-CM | POA: Diagnosis not present

## 2012-12-12 DIAGNOSIS — Z886 Allergy status to analgesic agent status: Secondary | ICD-10-CM | POA: Diagnosis not present

## 2012-12-12 DIAGNOSIS — Z8269 Family history of other diseases of the musculoskeletal system and connective tissue: Secondary | ICD-10-CM | POA: Diagnosis not present

## 2012-12-12 DIAGNOSIS — I129 Hypertensive chronic kidney disease with stage 1 through stage 4 chronic kidney disease, or unspecified chronic kidney disease: Secondary | ICD-10-CM | POA: Diagnosis not present

## 2012-12-12 DIAGNOSIS — Z471 Aftercare following joint replacement surgery: Secondary | ICD-10-CM | POA: Diagnosis not present

## 2012-12-12 DIAGNOSIS — Z7982 Long term (current) use of aspirin: Secondary | ICD-10-CM | POA: Diagnosis not present

## 2012-12-12 DIAGNOSIS — M069 Rheumatoid arthritis, unspecified: Secondary | ICD-10-CM | POA: Diagnosis not present

## 2012-12-12 DIAGNOSIS — Z79899 Other long term (current) drug therapy: Secondary | ICD-10-CM | POA: Diagnosis not present

## 2012-12-12 DIAGNOSIS — IMO0002 Reserved for concepts with insufficient information to code with codable children: Secondary | ICD-10-CM | POA: Diagnosis not present

## 2012-12-12 DIAGNOSIS — M1710 Unilateral primary osteoarthritis, unspecified knee: Secondary | ICD-10-CM | POA: Diagnosis present

## 2012-12-12 DIAGNOSIS — Z780 Asymptomatic menopausal state: Secondary | ICD-10-CM | POA: Diagnosis not present

## 2012-12-12 DIAGNOSIS — Z6839 Body mass index (BMI) 39.0-39.9, adult: Secondary | ICD-10-CM | POA: Diagnosis not present

## 2012-12-12 DIAGNOSIS — Z87891 Personal history of nicotine dependence: Secondary | ICD-10-CM | POA: Diagnosis not present

## 2012-12-15 DIAGNOSIS — M6281 Muscle weakness (generalized): Secondary | ICD-10-CM | POA: Diagnosis not present

## 2012-12-15 DIAGNOSIS — R262 Difficulty in walking, not elsewhere classified: Secondary | ICD-10-CM | POA: Diagnosis not present

## 2012-12-15 DIAGNOSIS — IMO0001 Reserved for inherently not codable concepts without codable children: Secondary | ICD-10-CM | POA: Diagnosis not present

## 2012-12-15 DIAGNOSIS — M25569 Pain in unspecified knee: Secondary | ICD-10-CM | POA: Diagnosis not present

## 2012-12-18 DIAGNOSIS — IMO0001 Reserved for inherently not codable concepts without codable children: Secondary | ICD-10-CM | POA: Diagnosis not present

## 2012-12-18 DIAGNOSIS — M25569 Pain in unspecified knee: Secondary | ICD-10-CM | POA: Diagnosis not present

## 2012-12-18 DIAGNOSIS — R262 Difficulty in walking, not elsewhere classified: Secondary | ICD-10-CM | POA: Diagnosis not present

## 2012-12-18 DIAGNOSIS — M6281 Muscle weakness (generalized): Secondary | ICD-10-CM | POA: Diagnosis not present

## 2012-12-20 DIAGNOSIS — IMO0001 Reserved for inherently not codable concepts without codable children: Secondary | ICD-10-CM | POA: Diagnosis not present

## 2012-12-20 DIAGNOSIS — I951 Orthostatic hypotension: Secondary | ICD-10-CM | POA: Diagnosis not present

## 2012-12-20 DIAGNOSIS — Z96659 Presence of unspecified artificial knee joint: Secondary | ICD-10-CM | POA: Diagnosis not present

## 2012-12-20 DIAGNOSIS — M25569 Pain in unspecified knee: Secondary | ICD-10-CM | POA: Diagnosis not present

## 2012-12-20 DIAGNOSIS — R262 Difficulty in walking, not elsewhere classified: Secondary | ICD-10-CM | POA: Diagnosis not present

## 2012-12-20 DIAGNOSIS — M6281 Muscle weakness (generalized): Secondary | ICD-10-CM | POA: Diagnosis not present

## 2012-12-22 DIAGNOSIS — Z96659 Presence of unspecified artificial knee joint: Secondary | ICD-10-CM | POA: Diagnosis not present

## 2012-12-22 DIAGNOSIS — M25569 Pain in unspecified knee: Secondary | ICD-10-CM | POA: Diagnosis not present

## 2012-12-22 DIAGNOSIS — R262 Difficulty in walking, not elsewhere classified: Secondary | ICD-10-CM | POA: Diagnosis not present

## 2012-12-22 DIAGNOSIS — M6281 Muscle weakness (generalized): Secondary | ICD-10-CM | POA: Diagnosis not present

## 2012-12-22 DIAGNOSIS — IMO0001 Reserved for inherently not codable concepts without codable children: Secondary | ICD-10-CM | POA: Diagnosis not present

## 2012-12-26 DIAGNOSIS — IMO0001 Reserved for inherently not codable concepts without codable children: Secondary | ICD-10-CM | POA: Diagnosis not present

## 2012-12-26 DIAGNOSIS — M6281 Muscle weakness (generalized): Secondary | ICD-10-CM | POA: Diagnosis not present

## 2012-12-26 DIAGNOSIS — R262 Difficulty in walking, not elsewhere classified: Secondary | ICD-10-CM | POA: Diagnosis not present

## 2012-12-26 DIAGNOSIS — Z96659 Presence of unspecified artificial knee joint: Secondary | ICD-10-CM | POA: Diagnosis not present

## 2012-12-26 DIAGNOSIS — M25569 Pain in unspecified knee: Secondary | ICD-10-CM | POA: Diagnosis not present

## 2012-12-28 DIAGNOSIS — R262 Difficulty in walking, not elsewhere classified: Secondary | ICD-10-CM | POA: Diagnosis not present

## 2012-12-28 DIAGNOSIS — IMO0001 Reserved for inherently not codable concepts without codable children: Secondary | ICD-10-CM | POA: Diagnosis not present

## 2012-12-28 DIAGNOSIS — M25569 Pain in unspecified knee: Secondary | ICD-10-CM | POA: Diagnosis not present

## 2012-12-28 DIAGNOSIS — M6281 Muscle weakness (generalized): Secondary | ICD-10-CM | POA: Diagnosis not present

## 2012-12-28 DIAGNOSIS — Z96659 Presence of unspecified artificial knee joint: Secondary | ICD-10-CM | POA: Diagnosis not present

## 2012-12-29 DIAGNOSIS — M6281 Muscle weakness (generalized): Secondary | ICD-10-CM | POA: Diagnosis not present

## 2012-12-29 DIAGNOSIS — R262 Difficulty in walking, not elsewhere classified: Secondary | ICD-10-CM | POA: Diagnosis not present

## 2012-12-29 DIAGNOSIS — M25569 Pain in unspecified knee: Secondary | ICD-10-CM | POA: Diagnosis not present

## 2012-12-29 DIAGNOSIS — IMO0001 Reserved for inherently not codable concepts without codable children: Secondary | ICD-10-CM | POA: Diagnosis not present

## 2012-12-29 DIAGNOSIS — Z96659 Presence of unspecified artificial knee joint: Secondary | ICD-10-CM | POA: Diagnosis not present

## 2013-01-01 DIAGNOSIS — IMO0001 Reserved for inherently not codable concepts without codable children: Secondary | ICD-10-CM | POA: Diagnosis not present

## 2013-01-01 DIAGNOSIS — R262 Difficulty in walking, not elsewhere classified: Secondary | ICD-10-CM | POA: Diagnosis not present

## 2013-01-01 DIAGNOSIS — M25569 Pain in unspecified knee: Secondary | ICD-10-CM | POA: Diagnosis not present

## 2013-01-01 DIAGNOSIS — Z96659 Presence of unspecified artificial knee joint: Secondary | ICD-10-CM | POA: Diagnosis not present

## 2013-01-01 DIAGNOSIS — M6281 Muscle weakness (generalized): Secondary | ICD-10-CM | POA: Diagnosis not present

## 2013-01-02 DIAGNOSIS — I951 Orthostatic hypotension: Secondary | ICD-10-CM | POA: Diagnosis not present

## 2013-01-03 DIAGNOSIS — M25569 Pain in unspecified knee: Secondary | ICD-10-CM | POA: Diagnosis not present

## 2013-01-03 DIAGNOSIS — R262 Difficulty in walking, not elsewhere classified: Secondary | ICD-10-CM | POA: Diagnosis not present

## 2013-01-03 DIAGNOSIS — Z96659 Presence of unspecified artificial knee joint: Secondary | ICD-10-CM | POA: Diagnosis not present

## 2013-01-03 DIAGNOSIS — IMO0001 Reserved for inherently not codable concepts without codable children: Secondary | ICD-10-CM | POA: Diagnosis not present

## 2013-01-03 DIAGNOSIS — M6281 Muscle weakness (generalized): Secondary | ICD-10-CM | POA: Diagnosis not present

## 2013-01-05 DIAGNOSIS — M6281 Muscle weakness (generalized): Secondary | ICD-10-CM | POA: Diagnosis not present

## 2013-01-05 DIAGNOSIS — R262 Difficulty in walking, not elsewhere classified: Secondary | ICD-10-CM | POA: Diagnosis not present

## 2013-01-05 DIAGNOSIS — Z96659 Presence of unspecified artificial knee joint: Secondary | ICD-10-CM | POA: Diagnosis not present

## 2013-01-05 DIAGNOSIS — M25569 Pain in unspecified knee: Secondary | ICD-10-CM | POA: Diagnosis not present

## 2013-01-05 DIAGNOSIS — IMO0001 Reserved for inherently not codable concepts without codable children: Secondary | ICD-10-CM | POA: Diagnosis not present

## 2013-01-08 DIAGNOSIS — M25569 Pain in unspecified knee: Secondary | ICD-10-CM | POA: Diagnosis not present

## 2013-01-08 DIAGNOSIS — IMO0001 Reserved for inherently not codable concepts without codable children: Secondary | ICD-10-CM | POA: Diagnosis not present

## 2013-01-08 DIAGNOSIS — Z96659 Presence of unspecified artificial knee joint: Secondary | ICD-10-CM | POA: Diagnosis not present

## 2013-01-08 DIAGNOSIS — M6281 Muscle weakness (generalized): Secondary | ICD-10-CM | POA: Diagnosis not present

## 2013-01-08 DIAGNOSIS — R262 Difficulty in walking, not elsewhere classified: Secondary | ICD-10-CM | POA: Diagnosis not present

## 2013-01-10 DIAGNOSIS — M25569 Pain in unspecified knee: Secondary | ICD-10-CM | POA: Diagnosis not present

## 2013-01-10 DIAGNOSIS — IMO0001 Reserved for inherently not codable concepts without codable children: Secondary | ICD-10-CM | POA: Diagnosis not present

## 2013-01-10 DIAGNOSIS — R262 Difficulty in walking, not elsewhere classified: Secondary | ICD-10-CM | POA: Diagnosis not present

## 2013-01-10 DIAGNOSIS — Z96659 Presence of unspecified artificial knee joint: Secondary | ICD-10-CM | POA: Diagnosis not present

## 2013-01-10 DIAGNOSIS — M6281 Muscle weakness (generalized): Secondary | ICD-10-CM | POA: Diagnosis not present

## 2013-01-12 DIAGNOSIS — R262 Difficulty in walking, not elsewhere classified: Secondary | ICD-10-CM | POA: Diagnosis not present

## 2013-01-12 DIAGNOSIS — M25569 Pain in unspecified knee: Secondary | ICD-10-CM | POA: Diagnosis not present

## 2013-01-12 DIAGNOSIS — IMO0001 Reserved for inherently not codable concepts without codable children: Secondary | ICD-10-CM | POA: Diagnosis not present

## 2013-01-12 DIAGNOSIS — M6281 Muscle weakness (generalized): Secondary | ICD-10-CM | POA: Diagnosis not present

## 2013-01-12 DIAGNOSIS — Z96659 Presence of unspecified artificial knee joint: Secondary | ICD-10-CM | POA: Diagnosis not present

## 2013-01-15 DIAGNOSIS — IMO0001 Reserved for inherently not codable concepts without codable children: Secondary | ICD-10-CM | POA: Diagnosis not present

## 2013-01-15 DIAGNOSIS — M6281 Muscle weakness (generalized): Secondary | ICD-10-CM | POA: Diagnosis not present

## 2013-01-15 DIAGNOSIS — Z96659 Presence of unspecified artificial knee joint: Secondary | ICD-10-CM | POA: Diagnosis not present

## 2013-01-15 DIAGNOSIS — R262 Difficulty in walking, not elsewhere classified: Secondary | ICD-10-CM | POA: Diagnosis not present

## 2013-01-15 DIAGNOSIS — M25569 Pain in unspecified knee: Secondary | ICD-10-CM | POA: Diagnosis not present

## 2013-01-17 DIAGNOSIS — IMO0001 Reserved for inherently not codable concepts without codable children: Secondary | ICD-10-CM | POA: Diagnosis not present

## 2013-01-17 DIAGNOSIS — M25569 Pain in unspecified knee: Secondary | ICD-10-CM | POA: Diagnosis not present

## 2013-01-17 DIAGNOSIS — M6281 Muscle weakness (generalized): Secondary | ICD-10-CM | POA: Diagnosis not present

## 2013-01-17 DIAGNOSIS — Z96659 Presence of unspecified artificial knee joint: Secondary | ICD-10-CM | POA: Diagnosis not present

## 2013-01-17 DIAGNOSIS — R262 Difficulty in walking, not elsewhere classified: Secondary | ICD-10-CM | POA: Diagnosis not present

## 2013-01-18 DIAGNOSIS — R262 Difficulty in walking, not elsewhere classified: Secondary | ICD-10-CM | POA: Diagnosis not present

## 2013-01-18 DIAGNOSIS — Z96659 Presence of unspecified artificial knee joint: Secondary | ICD-10-CM | POA: Diagnosis not present

## 2013-01-18 DIAGNOSIS — M6281 Muscle weakness (generalized): Secondary | ICD-10-CM | POA: Diagnosis not present

## 2013-01-18 DIAGNOSIS — IMO0001 Reserved for inherently not codable concepts without codable children: Secondary | ICD-10-CM | POA: Diagnosis not present

## 2013-01-18 DIAGNOSIS — M25569 Pain in unspecified knee: Secondary | ICD-10-CM | POA: Diagnosis not present

## 2013-01-22 DIAGNOSIS — IMO0001 Reserved for inherently not codable concepts without codable children: Secondary | ICD-10-CM | POA: Diagnosis not present

## 2013-01-22 DIAGNOSIS — R262 Difficulty in walking, not elsewhere classified: Secondary | ICD-10-CM | POA: Diagnosis not present

## 2013-01-22 DIAGNOSIS — M6281 Muscle weakness (generalized): Secondary | ICD-10-CM | POA: Diagnosis not present

## 2013-01-22 DIAGNOSIS — M25569 Pain in unspecified knee: Secondary | ICD-10-CM | POA: Diagnosis not present

## 2013-01-24 DIAGNOSIS — M6281 Muscle weakness (generalized): Secondary | ICD-10-CM | POA: Diagnosis not present

## 2013-01-24 DIAGNOSIS — R262 Difficulty in walking, not elsewhere classified: Secondary | ICD-10-CM | POA: Diagnosis not present

## 2013-01-24 DIAGNOSIS — IMO0001 Reserved for inherently not codable concepts without codable children: Secondary | ICD-10-CM | POA: Diagnosis not present

## 2013-01-24 DIAGNOSIS — M25569 Pain in unspecified knee: Secondary | ICD-10-CM | POA: Diagnosis not present

## 2013-01-26 DIAGNOSIS — R262 Difficulty in walking, not elsewhere classified: Secondary | ICD-10-CM | POA: Diagnosis not present

## 2013-01-26 DIAGNOSIS — IMO0001 Reserved for inherently not codable concepts without codable children: Secondary | ICD-10-CM | POA: Diagnosis not present

## 2013-01-26 DIAGNOSIS — M6281 Muscle weakness (generalized): Secondary | ICD-10-CM | POA: Diagnosis not present

## 2013-01-26 DIAGNOSIS — M25569 Pain in unspecified knee: Secondary | ICD-10-CM | POA: Diagnosis not present

## 2013-01-29 DIAGNOSIS — IMO0001 Reserved for inherently not codable concepts without codable children: Secondary | ICD-10-CM | POA: Diagnosis not present

## 2013-01-29 DIAGNOSIS — M25569 Pain in unspecified knee: Secondary | ICD-10-CM | POA: Diagnosis not present

## 2013-01-29 DIAGNOSIS — M6281 Muscle weakness (generalized): Secondary | ICD-10-CM | POA: Diagnosis not present

## 2013-01-29 DIAGNOSIS — R262 Difficulty in walking, not elsewhere classified: Secondary | ICD-10-CM | POA: Diagnosis not present

## 2013-02-08 DIAGNOSIS — M25569 Pain in unspecified knee: Secondary | ICD-10-CM | POA: Diagnosis not present

## 2013-02-08 DIAGNOSIS — Z96659 Presence of unspecified artificial knee joint: Secondary | ICD-10-CM | POA: Diagnosis not present

## 2013-02-20 NOTE — Telephone Encounter (Signed)
Patient was seen in Feb of 2014 with Dr. Diona Browner and will receive a call for a follow up appt with him as soon as possible.

## 2013-02-22 DIAGNOSIS — Z0000 Encounter for general adult medical examination without abnormal findings: Secondary | ICD-10-CM | POA: Diagnosis not present

## 2013-04-04 DIAGNOSIS — N189 Chronic kidney disease, unspecified: Secondary | ICD-10-CM | POA: Diagnosis not present

## 2013-04-04 DIAGNOSIS — D649 Anemia, unspecified: Secondary | ICD-10-CM | POA: Diagnosis not present

## 2013-04-04 DIAGNOSIS — E559 Vitamin D deficiency, unspecified: Secondary | ICD-10-CM | POA: Diagnosis not present

## 2013-04-04 DIAGNOSIS — Z79899 Other long term (current) drug therapy: Secondary | ICD-10-CM | POA: Diagnosis not present

## 2013-04-04 DIAGNOSIS — I129 Hypertensive chronic kidney disease with stage 1 through stage 4 chronic kidney disease, or unspecified chronic kidney disease: Secondary | ICD-10-CM | POA: Diagnosis not present

## 2013-04-04 DIAGNOSIS — R809 Proteinuria, unspecified: Secondary | ICD-10-CM | POA: Diagnosis not present

## 2013-04-10 DIAGNOSIS — N183 Chronic kidney disease, stage 3 unspecified: Secondary | ICD-10-CM | POA: Diagnosis not present

## 2013-04-10 DIAGNOSIS — I10 Essential (primary) hypertension: Secondary | ICD-10-CM | POA: Diagnosis not present

## 2013-04-10 DIAGNOSIS — D649 Anemia, unspecified: Secondary | ICD-10-CM | POA: Diagnosis not present

## 2013-04-10 DIAGNOSIS — E559 Vitamin D deficiency, unspecified: Secondary | ICD-10-CM | POA: Diagnosis not present

## 2013-04-16 ENCOUNTER — Encounter: Payer: Self-pay | Admitting: Cardiology

## 2013-04-16 NOTE — Progress Notes (Signed)
Clinical Summary Diana Patterson is a 68 y.o.female last seen in February 2014. She is here with her husband. States that she has done well, no angina or unusual shortness of breath. She has had both knees replaced by Dr. Case since the last visit.  Recent labwork showed Hgb 12.4, BUN 27, creatinine 1.4, potassium 4.3. She still follows with Dr. Lowanda Foster.  She has been managed medically for possible underlying CAD with history of type II NSTEMI in the past. Cardiolite from August 2012 revealed LVEF 68% without evidence of ischemia. Invasive cardiac workup was not pursued in light of chronic kidney disease.  ECG today shows normal sinus rhythm.   Allergies  Allergen Reactions  . Morphine And Related Other (See Comments)    Current Outpatient Prescriptions  Medication Sig Dispense Refill  . aspirin 81 MG tablet Take 81 mg by mouth daily.        . bisacodyl (DULCOLAX) 5 MG EC tablet Take 5 mg by mouth daily.      . Calcium Carb-Cholecalciferol (CALCIUM 1000 + D) 1000-800 MG-UNIT TABS Take 1 tablet by mouth daily.       . Choline Fenofibrate (TRILIPIX) 135 MG capsule Take 135 mg by mouth daily.        . folic acid (FOLVITE) 1 MG tablet Take 1 mg by mouth daily.      . isosorbide dinitrate (ISORDIL) 30 MG tablet Take 30 mg by mouth 2 (two) times daily.      [name] losartan (COZAAR) 25 MG tablet Take 25 mg by mouth daily.        . metaxalone (SKELAXIN) 800 MG tablet Take 800 mg by mouth 2 (two) times daily.       . metoprolol succinate (TOPROL-XL) 25 MG 24 hr tablet Take 25 mg by mouth daily.      . Omega-3 Fatty Acids (FISH OIL) 1000 MG CAPS Take 1 capsule by mouth daily.       [name] POLYSACCH FE COMPLEX-VIT D3 PO Take 1 tablet by mouth daily.      . valACYclovir (VALTREX) 500 MG tablet Take 500 mg by mouth 2 (two) times daily.       No current facility-administered medications for this visit.    Past Medical History  Diagnosis Date  . NSTEMI (non-ST elevated myocardial infarction)    Probable type II event, negative Cardiolite  . Essential hypertension, benign   . Mixed hyperlipidemia   . Type 2 diabetes mellitus   . TIA (transient ischemic attack)   . CKD (chronic kidney disease) stage 3, GFR 30-59 ml/min     Dr. Lowanda Foster    Social History Diana Patterson reports that she quit smoking about 35 years ago. Her smoking use included Cigarettes. She has a 20 pack-year smoking history. She has never used smokeless tobacco. Diana Patterson reports that she does not drink alcohol.  Review of Systems No palpitations, dizziness, syncope. No orthopnea or PND. No claudication. Otherwise negative.  Physical Examination Filed Vitals:   04/17/13 0906  BP: 118/62  Pulse: 86   Filed Weights   04/17/13 0906  Weight: 246 lb (111.585 kg)    No acute distress.  HEENT: Conjunctiva and lids normal, oropharynx clear.  Neck: Supple, no elevated JVP or carotid bruits, no thyromegaly.  Lungs: Clear to auscultation, nonlabored breathing at rest.  Cardiac: Regular rate and rhythm, no S3 or significant systolic murmur, no pericardial rub.  Abdomen: Soft, nontender,bowel sounds present, no guarding or rebound.  Extremities: No  pitting edema, distal pulses 1-2+.  Skin: Warm and dry.  Musculoskeletal: No kyphosis.  Neuropsychiatric: Alert and oriented x3, affect grossly appropriate.    Problem List and Plan   Coronary atherosclerosis of native coronary artery Diagnosis based on prior NSTEMI in 2012, although structural and ischemic workup by noninvasive methods have been reassuring. She is asymptomatic at this time and ECG is stable. Continue medical therapy. Annual followup.  CKD (chronic kidney disease) stage 3, GFR 30-59 ml/min Continues to follow with Dr. Lowanda Foster. Recent creatinine 1.4.  Essential hypertension, benign Blood pressure is normal today.  Dyslipidemia Followed by Dr. Sherrie Sport, medical therapy outlined above.    Satira Sark, M.D., F.A.C.C.

## 2013-04-17 ENCOUNTER — Ambulatory Visit (INDEPENDENT_AMBULATORY_CARE_PROVIDER_SITE_OTHER): Payer: Medicare Other | Admitting: Cardiology

## 2013-04-17 ENCOUNTER — Encounter: Payer: Self-pay | Admitting: Cardiology

## 2013-04-17 VITALS — BP 118/62 | HR 86 | Ht 66.0 in | Wt 246.0 lb

## 2013-04-17 DIAGNOSIS — E785 Hyperlipidemia, unspecified: Secondary | ICD-10-CM

## 2013-04-17 DIAGNOSIS — I10 Essential (primary) hypertension: Secondary | ICD-10-CM

## 2013-04-17 DIAGNOSIS — N183 Chronic kidney disease, stage 3 unspecified: Secondary | ICD-10-CM

## 2013-04-17 DIAGNOSIS — I2510 Atherosclerotic heart disease of native coronary artery without angina pectoris: Secondary | ICD-10-CM

## 2013-04-17 NOTE — Assessment & Plan Note (Signed)
Diagnosis based on prior NSTEMI in 2012, although structural and ischemic workup by noninvasive methods have been reassuring. She is asymptomatic at this time and ECG is stable. Continue medical therapy. Annual followup.

## 2013-04-17 NOTE — Assessment & Plan Note (Signed)
Blood pressure is normal today. 

## 2013-04-17 NOTE — Assessment & Plan Note (Signed)
Continues to follow with Dr. Lowanda Foster. Recent creatinine 1.4.

## 2013-04-17 NOTE — Assessment & Plan Note (Signed)
Followed by Dr. Sherrie Sport, medical therapy outlined above.

## 2013-04-17 NOTE — Patient Instructions (Signed)

## 2013-04-19 ENCOUNTER — Ambulatory Visit: Payer: Medicare Other | Admitting: Cardiology

## 2013-05-14 DIAGNOSIS — J159 Unspecified bacterial pneumonia: Secondary | ICD-10-CM | POA: Diagnosis not present

## 2013-05-29 DIAGNOSIS — K829 Disease of gallbladder, unspecified: Secondary | ICD-10-CM | POA: Diagnosis not present

## 2013-06-08 DIAGNOSIS — K8020 Calculus of gallbladder without cholecystitis without obstruction: Secondary | ICD-10-CM | POA: Diagnosis not present

## 2013-06-08 DIAGNOSIS — R109 Unspecified abdominal pain: Secondary | ICD-10-CM | POA: Diagnosis not present

## 2013-06-13 DIAGNOSIS — E119 Type 2 diabetes mellitus without complications: Secondary | ICD-10-CM | POA: Diagnosis not present

## 2013-06-13 DIAGNOSIS — I10 Essential (primary) hypertension: Secondary | ICD-10-CM | POA: Diagnosis not present

## 2013-06-13 DIAGNOSIS — E782 Mixed hyperlipidemia: Secondary | ICD-10-CM | POA: Diagnosis not present

## 2013-06-13 DIAGNOSIS — Z131 Encounter for screening for diabetes mellitus: Secondary | ICD-10-CM | POA: Diagnosis not present

## 2013-06-19 DIAGNOSIS — H43399 Other vitreous opacities, unspecified eye: Secondary | ICD-10-CM | POA: Diagnosis not present

## 2013-06-20 DIAGNOSIS — K8020 Calculus of gallbladder without cholecystitis without obstruction: Secondary | ICD-10-CM | POA: Diagnosis not present

## 2013-07-02 DIAGNOSIS — Z79899 Other long term (current) drug therapy: Secondary | ICD-10-CM | POA: Diagnosis not present

## 2013-07-02 DIAGNOSIS — N189 Chronic kidney disease, unspecified: Secondary | ICD-10-CM | POA: Diagnosis not present

## 2013-07-02 DIAGNOSIS — E669 Obesity, unspecified: Secondary | ICD-10-CM | POA: Diagnosis not present

## 2013-07-02 DIAGNOSIS — Z8489 Family history of other specified conditions: Secondary | ICD-10-CM | POA: Diagnosis not present

## 2013-07-02 DIAGNOSIS — Z8673 Personal history of transient ischemic attack (TIA), and cerebral infarction without residual deficits: Secondary | ICD-10-CM | POA: Diagnosis not present

## 2013-07-02 DIAGNOSIS — E119 Type 2 diabetes mellitus without complications: Secondary | ICD-10-CM | POA: Diagnosis not present

## 2013-07-02 DIAGNOSIS — I129 Hypertensive chronic kidney disease with stage 1 through stage 4 chronic kidney disease, or unspecified chronic kidney disease: Secondary | ICD-10-CM | POA: Diagnosis not present

## 2013-07-02 DIAGNOSIS — M1990 Unspecified osteoarthritis, unspecified site: Secondary | ICD-10-CM | POA: Diagnosis not present

## 2013-07-02 DIAGNOSIS — Z96659 Presence of unspecified artificial knee joint: Secondary | ICD-10-CM | POA: Diagnosis not present

## 2013-07-02 DIAGNOSIS — Z6838 Body mass index (BMI) 38.0-38.9, adult: Secondary | ICD-10-CM | POA: Diagnosis not present

## 2013-07-02 DIAGNOSIS — Z885 Allergy status to narcotic agent status: Secondary | ICD-10-CM | POA: Diagnosis not present

## 2013-07-02 DIAGNOSIS — I2510 Atherosclerotic heart disease of native coronary artery without angina pectoris: Secondary | ICD-10-CM | POA: Diagnosis not present

## 2013-07-02 DIAGNOSIS — Z7982 Long term (current) use of aspirin: Secondary | ICD-10-CM | POA: Diagnosis not present

## 2013-07-02 DIAGNOSIS — K8020 Calculus of gallbladder without cholecystitis without obstruction: Secondary | ICD-10-CM | POA: Diagnosis not present

## 2013-07-02 DIAGNOSIS — Z8249 Family history of ischemic heart disease and other diseases of the circulatory system: Secondary | ICD-10-CM | POA: Diagnosis not present

## 2013-07-03 DIAGNOSIS — E119 Type 2 diabetes mellitus without complications: Secondary | ICD-10-CM | POA: Diagnosis not present

## 2013-07-03 DIAGNOSIS — Z8673 Personal history of transient ischemic attack (TIA), and cerebral infarction without residual deficits: Secondary | ICD-10-CM | POA: Diagnosis not present

## 2013-07-03 DIAGNOSIS — Z96659 Presence of unspecified artificial knee joint: Secondary | ICD-10-CM | POA: Diagnosis not present

## 2013-07-03 DIAGNOSIS — K8020 Calculus of gallbladder without cholecystitis without obstruction: Secondary | ICD-10-CM | POA: Diagnosis not present

## 2013-07-03 DIAGNOSIS — I2510 Atherosclerotic heart disease of native coronary artery without angina pectoris: Secondary | ICD-10-CM | POA: Diagnosis not present

## 2013-07-03 DIAGNOSIS — Z885 Allergy status to narcotic agent status: Secondary | ICD-10-CM | POA: Diagnosis not present

## 2013-07-03 DIAGNOSIS — Z79899 Other long term (current) drug therapy: Secondary | ICD-10-CM | POA: Diagnosis not present

## 2013-07-03 DIAGNOSIS — Z7982 Long term (current) use of aspirin: Secondary | ICD-10-CM | POA: Diagnosis not present

## 2013-08-15 DIAGNOSIS — R809 Proteinuria, unspecified: Secondary | ICD-10-CM | POA: Diagnosis not present

## 2013-08-15 DIAGNOSIS — I129 Hypertensive chronic kidney disease with stage 1 through stage 4 chronic kidney disease, or unspecified chronic kidney disease: Secondary | ICD-10-CM | POA: Diagnosis not present

## 2013-08-15 DIAGNOSIS — E559 Vitamin D deficiency, unspecified: Secondary | ICD-10-CM | POA: Diagnosis not present

## 2013-08-15 DIAGNOSIS — D649 Anemia, unspecified: Secondary | ICD-10-CM | POA: Diagnosis not present

## 2013-08-15 DIAGNOSIS — N189 Chronic kidney disease, unspecified: Secondary | ICD-10-CM | POA: Diagnosis not present

## 2013-08-15 DIAGNOSIS — Z79899 Other long term (current) drug therapy: Secondary | ICD-10-CM | POA: Diagnosis not present

## 2013-08-21 DIAGNOSIS — N183 Chronic kidney disease, stage 3 unspecified: Secondary | ICD-10-CM | POA: Diagnosis not present

## 2013-08-21 DIAGNOSIS — E669 Obesity, unspecified: Secondary | ICD-10-CM | POA: Diagnosis not present

## 2013-08-21 DIAGNOSIS — I10 Essential (primary) hypertension: Secondary | ICD-10-CM | POA: Diagnosis not present

## 2013-08-21 DIAGNOSIS — E559 Vitamin D deficiency, unspecified: Secondary | ICD-10-CM | POA: Diagnosis not present

## 2013-08-30 DIAGNOSIS — E782 Mixed hyperlipidemia: Secondary | ICD-10-CM | POA: Diagnosis not present

## 2013-08-30 DIAGNOSIS — IMO0001 Reserved for inherently not codable concepts without codable children: Secondary | ICD-10-CM | POA: Diagnosis not present

## 2013-11-30 DIAGNOSIS — I10 Essential (primary) hypertension: Secondary | ICD-10-CM | POA: Diagnosis not present

## 2013-11-30 DIAGNOSIS — IMO0001 Reserved for inherently not codable concepts without codable children: Secondary | ICD-10-CM | POA: Diagnosis not present

## 2013-12-11 DIAGNOSIS — Z1231 Encounter for screening mammogram for malignant neoplasm of breast: Secondary | ICD-10-CM | POA: Diagnosis not present

## 2013-12-28 DIAGNOSIS — Z23 Encounter for immunization: Secondary | ICD-10-CM | POA: Diagnosis not present

## 2014-02-19 DIAGNOSIS — R809 Proteinuria, unspecified: Secondary | ICD-10-CM | POA: Diagnosis not present

## 2014-02-19 DIAGNOSIS — I129 Hypertensive chronic kidney disease with stage 1 through stage 4 chronic kidney disease, or unspecified chronic kidney disease: Secondary | ICD-10-CM | POA: Diagnosis not present

## 2014-02-19 DIAGNOSIS — Z79899 Other long term (current) drug therapy: Secondary | ICD-10-CM | POA: Diagnosis not present

## 2014-02-19 DIAGNOSIS — N183 Chronic kidney disease, stage 3 (moderate): Secondary | ICD-10-CM | POA: Diagnosis not present

## 2014-02-19 DIAGNOSIS — D649 Anemia, unspecified: Secondary | ICD-10-CM | POA: Diagnosis not present

## 2014-02-19 DIAGNOSIS — E559 Vitamin D deficiency, unspecified: Secondary | ICD-10-CM | POA: Diagnosis not present

## 2014-02-20 DIAGNOSIS — E559 Vitamin D deficiency, unspecified: Secondary | ICD-10-CM | POA: Diagnosis not present

## 2014-02-20 DIAGNOSIS — I10 Essential (primary) hypertension: Secondary | ICD-10-CM | POA: Diagnosis not present

## 2014-02-20 DIAGNOSIS — E669 Obesity, unspecified: Secondary | ICD-10-CM | POA: Diagnosis not present

## 2014-02-20 DIAGNOSIS — N183 Chronic kidney disease, stage 3 (moderate): Secondary | ICD-10-CM | POA: Diagnosis not present

## 2014-03-04 DIAGNOSIS — E11649 Type 2 diabetes mellitus with hypoglycemia without coma: Secondary | ICD-10-CM | POA: Diagnosis not present

## 2014-03-04 DIAGNOSIS — I10 Essential (primary) hypertension: Secondary | ICD-10-CM | POA: Diagnosis not present

## 2014-03-04 DIAGNOSIS — M0600 Rheumatoid arthritis without rheumatoid factor, unspecified site: Secondary | ICD-10-CM | POA: Diagnosis not present

## 2014-03-08 DIAGNOSIS — I10 Essential (primary) hypertension: Secondary | ICD-10-CM | POA: Diagnosis not present

## 2014-03-08 DIAGNOSIS — Z79899 Other long term (current) drug therapy: Secondary | ICD-10-CM | POA: Diagnosis not present

## 2014-03-08 DIAGNOSIS — D6489 Other specified anemias: Secondary | ICD-10-CM | POA: Diagnosis not present

## 2014-03-08 DIAGNOSIS — Z9851 Tubal ligation status: Secondary | ICD-10-CM | POA: Diagnosis not present

## 2014-03-08 DIAGNOSIS — R51 Headache: Secondary | ICD-10-CM | POA: Diagnosis not present

## 2014-03-08 DIAGNOSIS — R42 Dizziness and giddiness: Secondary | ICD-10-CM | POA: Diagnosis not present

## 2014-03-08 DIAGNOSIS — E669 Obesity, unspecified: Secondary | ICD-10-CM | POA: Diagnosis not present

## 2014-03-08 DIAGNOSIS — E784 Other hyperlipidemia: Secondary | ICD-10-CM | POA: Diagnosis not present

## 2014-03-08 DIAGNOSIS — I252 Old myocardial infarction: Secondary | ICD-10-CM | POA: Diagnosis not present

## 2014-03-08 DIAGNOSIS — Z96651 Presence of right artificial knee joint: Secondary | ICD-10-CM | POA: Diagnosis not present

## 2014-03-08 DIAGNOSIS — M1990 Unspecified osteoarthritis, unspecified site: Secondary | ICD-10-CM | POA: Diagnosis not present

## 2014-03-13 DIAGNOSIS — H8113 Benign paroxysmal vertigo, bilateral: Secondary | ICD-10-CM | POA: Diagnosis not present

## 2014-03-22 DIAGNOSIS — I214 Non-ST elevation (NSTEMI) myocardial infarction: Secondary | ICD-10-CM

## 2014-03-22 HISTORY — DX: Non-ST elevation (NSTEMI) myocardial infarction: I21.4

## 2014-05-15 DIAGNOSIS — M25562 Pain in left knee: Secondary | ICD-10-CM | POA: Diagnosis not present

## 2014-05-15 DIAGNOSIS — M25561 Pain in right knee: Secondary | ICD-10-CM | POA: Diagnosis not present

## 2014-06-03 ENCOUNTER — Encounter: Payer: Self-pay | Admitting: *Deleted

## 2014-06-03 ENCOUNTER — Ambulatory Visit (INDEPENDENT_AMBULATORY_CARE_PROVIDER_SITE_OTHER): Payer: Medicare Other | Admitting: Cardiology

## 2014-06-03 ENCOUNTER — Encounter: Payer: Self-pay | Admitting: Cardiology

## 2014-06-03 VITALS — BP 119/71 | HR 82 | Ht 64.0 in | Wt 243.8 lb

## 2014-06-03 DIAGNOSIS — I10 Essential (primary) hypertension: Secondary | ICD-10-CM | POA: Diagnosis not present

## 2014-06-03 DIAGNOSIS — E782 Mixed hyperlipidemia: Secondary | ICD-10-CM

## 2014-06-03 DIAGNOSIS — I2510 Atherosclerotic heart disease of native coronary artery without angina pectoris: Secondary | ICD-10-CM | POA: Diagnosis not present

## 2014-06-03 NOTE — Patient Instructions (Signed)

## 2014-06-03 NOTE — Progress Notes (Signed)
Cardiology Office Note  Date: 06/03/2014   ID: Diana Patterson, DOB 07-05-1945, MRN [id number]  PCP: Diana Burly, MD  Primary Cardiologist: Diana Lesches, MD   Chief Complaint  Patient presents with  . Coronary Artery Disease  . Hypertension    History of Present Illness: Diana Patterson is a 69 y.o. female last seen in January 2015. She is here with her husband today for a routine visit. She has had no angina symptoms, stable NYHA class II dyspnea. At the present time she is not exercising regularly. We did discuss a walking regimen.  She continues to follow with Dr. Sherrie Patterson for routine medical care. We reviewed her medications which are listed below. From a cardiac perspective she continues on aspirin, Toprol-XL, Cozaar, and Isordil. Follow-up ECG is normal.  She has been managed medically for possible underlying CAD with history of type 2 NSTEMI in the past. Cardiolite from August 2012 revealed LVEF 68% without evidence of ischemia.   Past Medical History  Diagnosis Date  . NSTEMI (non-ST elevated myocardial infarction)     Probable type II event, negative Cardiolite  . Essential hypertension, benign   . Mixed hyperlipidemia   . Type 2 diabetes mellitus   . TIA (transient ischemic attack)   . CKD (chronic kidney disease) stage 3, GFR 30-59 ml/min     Dr. Lowanda Patterson    Past Surgical History  Procedure Laterality Date  . Tubal pregnancy removal      Current Outpatient Prescriptions  Medication Sig Dispense Refill  . aspirin 81 MG tablet Take 81 mg by mouth daily.      . bisacodyl (DULCOLAX) 5 MG EC tablet Take 5 mg by mouth daily.    . Calcium Carb-Cholecalciferol (CALCIUM 1000 + D) 1000-800 MG-UNIT TABS Take 1 tablet by mouth daily.     . Choline Fenofibrate (TRILIPIX) 135 MG capsule Take 135 mg by mouth daily.      . folic acid (FOLVITE) 1 MG tablet Take 1 mg by mouth daily.    . isosorbide dinitrate (ISORDIL) 30 MG tablet Take 30 mg by mouth 2  (two) times daily.    [name] losartan (COZAAR) 25 MG tablet Take 25 mg by mouth daily.      . metaxalone (SKELAXIN) 800 MG tablet Take 800 mg by mouth 2 (two) times daily.     . metoprolol succinate (TOPROL-XL) 25 MG 24 hr tablet Take 25 mg by mouth daily.    . Omega-3 Fatty Acids (FISH OIL) 1000 MG CAPS Take 1 capsule by mouth 2 (two) times daily.     [name] POLYSACCH FE COMPLEX-VIT D3 PO Take 1 tablet by mouth daily.    . valACYclovir (VALTREX) 500 MG tablet Take 500 mg by mouth daily.      No current facility-administered medications for this visit.    Allergies:  Morphine and related   Social History: The patient  reports that she quit smoking about 36 years ago. Her smoking use included Cigarettes. She has a 20 pack-year smoking history. She has never used smokeless tobacco. She reports that she does not drink alcohol or use illicit drugs.   ROS:  Please see the history of present illness. Otherwise, complete review of systems is positive for none.  All other systems are reviewed and negative.    Physical Exam: VS:  BP 119/71 mmHg  Pulse 82  Ht 5\' 4"  (1.626 m)  Wt 243 lb 12.8 oz (110.587 kg)  BMI 41.83 kg/m2  SpO2  95%, BMI Body mass index is 41.83 kg/(m^2).  Wt Readings from Last 3 Encounters:  06/03/14 243 lb 12.8 oz (110.587 kg)  04/17/13 246 lb (111.585 kg)  05/17/12 239 lb (108.41 kg)     General: Patient appears comfortable at rest. HEENT: Conjunctiva and lids normal, oropharynx clear.  Neck: Supple, no elevated JVP or carotid bruits, no thyromegaly.  Lungs: Clear to auscultation, nonlabored breathing at rest.  Cardiac: Regular rate and rhythm, no S3 or significant systolic murmur, no pericardial rub.  Abdomen: Soft, nontender,bowel sounds present, no guarding or rebound.  Extremities: No pitting edema, distal pulses 1-2+.  Skin: Warm and dry.  Musculoskeletal: No kyphosis.  Neuropsychiatric: Alert and oriented x3, affect grossly appropriate.    ECG: ECG is ordered  today and reviewed showing normal sinus rhythm.   Assessment and Plan:  1. Suspected coronary artery disease based on previous demand ischemia by cardiac enzymes in 2012, although with low risk Cardiolite at that point. We have been following her on medical therapy. She remains stable and ECG is normal.  2. Essential hypertension, blood pressure is normal today. No changes were made.  3. Hyperlipidemia, she is on Trilipix and followed by Dr. Sherrie Patterson.  Current medicines are reviewed at length with the patient today.  The patient does not have concerns regarding medicines.   Orders Placed This Encounter  Procedures  . EKG 12-Lead    Disposition: FU with me in 1 year.   Signed, Satira Sark, MD, [facility] 06/03/2014 10:13 AM    Diana Patterson, Diana Patterson, [place] [address] Phone: [phone]; Fax: [phone]

## 2014-06-07 DIAGNOSIS — Z0000 Encounter for general adult medical examination without abnormal findings: Secondary | ICD-10-CM | POA: Diagnosis not present

## 2014-06-07 DIAGNOSIS — E11610 Type 2 diabetes mellitus with diabetic neuropathic arthropathy: Secondary | ICD-10-CM | POA: Diagnosis not present

## 2014-06-07 DIAGNOSIS — Z1389 Encounter for screening for other disorder: Secondary | ICD-10-CM | POA: Diagnosis not present

## 2014-06-07 DIAGNOSIS — I10 Essential (primary) hypertension: Secondary | ICD-10-CM | POA: Diagnosis not present

## 2014-06-24 DIAGNOSIS — Z780 Asymptomatic menopausal state: Secondary | ICD-10-CM | POA: Diagnosis not present

## 2014-06-24 DIAGNOSIS — I10 Essential (primary) hypertension: Secondary | ICD-10-CM | POA: Diagnosis not present

## 2014-06-24 DIAGNOSIS — Z79899 Other long term (current) drug therapy: Secondary | ICD-10-CM | POA: Diagnosis not present

## 2014-06-24 DIAGNOSIS — Z87891 Personal history of nicotine dependence: Secondary | ICD-10-CM | POA: Diagnosis not present

## 2014-06-24 DIAGNOSIS — Z96653 Presence of artificial knee joint, bilateral: Secondary | ICD-10-CM | POA: Diagnosis not present

## 2014-06-24 DIAGNOSIS — M810 Age-related osteoporosis without current pathological fracture: Secondary | ICD-10-CM | POA: Diagnosis not present

## 2014-06-24 DIAGNOSIS — Z13820 Encounter for screening for osteoporosis: Secondary | ICD-10-CM | POA: Diagnosis not present

## 2014-06-24 DIAGNOSIS — E780 Pure hypercholesterolemia: Secondary | ICD-10-CM | POA: Diagnosis not present

## 2014-06-24 DIAGNOSIS — E119 Type 2 diabetes mellitus without complications: Secondary | ICD-10-CM | POA: Diagnosis not present

## 2014-06-24 DIAGNOSIS — D649 Anemia, unspecified: Secondary | ICD-10-CM | POA: Diagnosis not present

## 2014-06-24 DIAGNOSIS — Z961 Presence of intraocular lens: Secondary | ICD-10-CM | POA: Diagnosis not present

## 2014-06-24 DIAGNOSIS — Z7982 Long term (current) use of aspirin: Secondary | ICD-10-CM | POA: Diagnosis not present

## 2014-08-26 DIAGNOSIS — N183 Chronic kidney disease, stage 3 (moderate): Secondary | ICD-10-CM | POA: Diagnosis not present

## 2014-08-26 DIAGNOSIS — E559 Vitamin D deficiency, unspecified: Secondary | ICD-10-CM | POA: Diagnosis not present

## 2014-08-26 DIAGNOSIS — D509 Iron deficiency anemia, unspecified: Secondary | ICD-10-CM | POA: Diagnosis not present

## 2014-08-26 DIAGNOSIS — I129 Hypertensive chronic kidney disease with stage 1 through stage 4 chronic kidney disease, or unspecified chronic kidney disease: Secondary | ICD-10-CM | POA: Diagnosis not present

## 2014-08-26 DIAGNOSIS — Z79899 Other long term (current) drug therapy: Secondary | ICD-10-CM | POA: Diagnosis not present

## 2014-08-26 DIAGNOSIS — R809 Proteinuria, unspecified: Secondary | ICD-10-CM | POA: Diagnosis not present

## 2014-08-28 DIAGNOSIS — I10 Essential (primary) hypertension: Secondary | ICD-10-CM | POA: Diagnosis not present

## 2014-08-28 DIAGNOSIS — N183 Chronic kidney disease, stage 3 (moderate): Secondary | ICD-10-CM | POA: Diagnosis not present

## 2014-08-28 DIAGNOSIS — E559 Vitamin D deficiency, unspecified: Secondary | ICD-10-CM | POA: Diagnosis not present

## 2014-08-28 DIAGNOSIS — E669 Obesity, unspecified: Secondary | ICD-10-CM | POA: Diagnosis not present

## 2014-09-09 DIAGNOSIS — I10 Essential (primary) hypertension: Secondary | ICD-10-CM | POA: Diagnosis not present

## 2014-09-09 DIAGNOSIS — E11610 Type 2 diabetes mellitus with diabetic neuropathic arthropathy: Secondary | ICD-10-CM | POA: Diagnosis not present

## 2014-09-09 DIAGNOSIS — G4739 Other sleep apnea: Secondary | ICD-10-CM | POA: Diagnosis not present

## 2014-09-30 DIAGNOSIS — G4730 Sleep apnea, unspecified: Secondary | ICD-10-CM | POA: Diagnosis not present

## 2014-10-16 DIAGNOSIS — G4733 Obstructive sleep apnea (adult) (pediatric): Secondary | ICD-10-CM | POA: Diagnosis not present

## 2014-12-06 DIAGNOSIS — Z23 Encounter for immunization: Secondary | ICD-10-CM | POA: Diagnosis not present

## 2014-12-10 DIAGNOSIS — E1121 Type 2 diabetes mellitus with diabetic nephropathy: Secondary | ICD-10-CM | POA: Diagnosis not present

## 2014-12-10 DIAGNOSIS — M059 Rheumatoid arthritis with rheumatoid factor, unspecified: Secondary | ICD-10-CM | POA: Diagnosis not present

## 2014-12-10 DIAGNOSIS — E11610 Type 2 diabetes mellitus with diabetic neuropathic arthropathy: Secondary | ICD-10-CM | POA: Diagnosis not present

## 2014-12-10 DIAGNOSIS — I10 Essential (primary) hypertension: Secondary | ICD-10-CM | POA: Diagnosis not present

## 2014-12-10 DIAGNOSIS — G4739 Other sleep apnea: Secondary | ICD-10-CM | POA: Diagnosis not present

## 2015-01-07 DIAGNOSIS — Z1231 Encounter for screening mammogram for malignant neoplasm of breast: Secondary | ICD-10-CM | POA: Diagnosis not present

## 2015-01-07 DIAGNOSIS — Z01419 Encounter for gynecological examination (general) (routine) without abnormal findings: Secondary | ICD-10-CM | POA: Diagnosis not present

## 2015-03-13 DIAGNOSIS — E11610 Type 2 diabetes mellitus with diabetic neuropathic arthropathy: Secondary | ICD-10-CM | POA: Diagnosis not present

## 2015-03-13 DIAGNOSIS — G4733 Obstructive sleep apnea (adult) (pediatric): Secondary | ICD-10-CM | POA: Diagnosis not present

## 2015-03-13 DIAGNOSIS — I10 Essential (primary) hypertension: Secondary | ICD-10-CM | POA: Diagnosis not present

## 2015-06-02 DIAGNOSIS — M25561 Pain in right knee: Secondary | ICD-10-CM | POA: Diagnosis not present

## 2015-06-02 DIAGNOSIS — Z96653 Presence of artificial knee joint, bilateral: Secondary | ICD-10-CM | POA: Diagnosis not present

## 2015-06-02 DIAGNOSIS — M25562 Pain in left knee: Secondary | ICD-10-CM | POA: Diagnosis not present

## 2015-06-06 ENCOUNTER — Encounter: Payer: Self-pay | Admitting: Cardiology

## 2015-06-06 ENCOUNTER — Ambulatory Visit (INDEPENDENT_AMBULATORY_CARE_PROVIDER_SITE_OTHER): Payer: Medicare Other | Admitting: Cardiology

## 2015-06-06 VITALS — BP 110/78 | HR 81 | Ht 66.0 in | Wt 243.0 lb

## 2015-06-06 DIAGNOSIS — E782 Mixed hyperlipidemia: Secondary | ICD-10-CM

## 2015-06-06 DIAGNOSIS — I10 Essential (primary) hypertension: Secondary | ICD-10-CM

## 2015-06-06 DIAGNOSIS — I2510 Atherosclerotic heart disease of native coronary artery without angina pectoris: Secondary | ICD-10-CM

## 2015-06-06 NOTE — Patient Instructions (Signed)

## 2015-06-06 NOTE — Progress Notes (Signed)
Cardiology Office Note  Date: 06/06/2015   ID: Diana Patterson, DOB [birth year], MRN [id number]  PCP: Neale Burly, MD  Primary Cardiologist: Rozann Lesches, MD   Chief Complaint  Patient presents with  . Cardiac follow-up    History of Present Illness: Diana Patterson is a 70 y.o. female last seen in March 2016. She is here today with her husband for a routine follow-up visit. She does not report any angina symptoms and has stable NYHA class II dyspnea. No major change in stamina over the last year.  She has been managed medically for possible underlying CAD with history of type 2 NSTEMI in the past. Cardiolite from August 2012 revealed LVEF 68% without evidence of ischemia. Follow-up ECG today shows normal sinus rhythm with low voltage. Medications include aspirin, Lasix, Isordil, Cozaar, Lopressor, and potassium supplements. She is not on statin therapy.  She follows with Dr. Sherrie Sport about every 3 months.  Past Medical History  Diagnosis Date  . NSTEMI (non-ST elevated myocardial infarction) ([place])     Probable type II event, negative Cardiolite  . Essential hypertension, benign   . Mixed hyperlipidemia   . Type 2 diabetes mellitus ([place])   . TIA (transient ischemic attack)   . CKD (chronic kidney disease) stage 3, GFR 30-59 ml/min     Dr. Lowanda Foster    Current Outpatient Prescriptions  Medication Sig Dispense Refill  . aspirin 81 MG tablet Take 81 mg by mouth daily.      . Calcium Carb-Cholecalciferol (CALCIUM 1000 + D) 1000-800 MG-UNIT TABS Take 1 tablet by mouth daily.     . Choline Fenofibrate (TRILIPIX) 135 MG capsule Take 135 mg by mouth daily.      . folic acid (FOLVITE) 1 MG tablet Take 1 mg by mouth daily.    . furosemide (LASIX) 20 MG tablet Take 20 mg by mouth as needed.    . isosorbide dinitrate (ISORDIL) 30 MG tablet Take 15 mg by mouth daily.    [name] losartan (COZAAR) 25 MG tablet Take 25 mg by mouth daily.      . metaxalone (SKELAXIN) 800 MG  tablet Take 800 mg by mouth 2 (two) times daily.     . metFORMIN (GLUCOPHAGE) 500 MG tablet Take 500 mg by mouth 2 (two) times daily with a meal.    . methotrexate (RHEUMATREX) 2.5 MG tablet Take 2.5 mg by mouth once a week. 6 tabs once weekly    . metoprolol tartrate (LOPRESSOR) 25 MG tablet Take 25 mg by mouth 2 (two) times daily.    . Omega-3 Fatty Acids (FISH OIL) 1000 MG CAPS Take 1 capsule by mouth 2 (two) times daily.     [name] POLYSACCH FE COMPLEX-VIT D3 PO Take 1 tablet by mouth daily.    . Polysaccharide Iron Complex (POLY-IRON 150 PO) Take by mouth daily.    . potassium chloride SA (K-DUR,KLOR-CON) 20 MEQ tablet Take 20 mEq by mouth daily.     No current facility-administered medications for this visit.   Allergies:  Morphine and related   Social History: The patient  reports that she quit smoking about 37 years ago. Her smoking use included Cigarettes. She has a 20 pack-year smoking history. She has never used smokeless tobacco. She reports that she does not drink alcohol or use illicit drugs.   ROS:  Please see the history of present illness. Otherwise, complete review of systems is positive for arthritic pains, brief "fluttering" that last for only a  few seconds.  All other systems are reviewed and negative.   Physical Exam: VS:  BP 110/78 mmHg  Pulse 81  Ht 5\' 6"  (1.676 m)  Wt 243 lb (110.224 kg)  BMI 39.24 kg/m2  SpO2 94%, BMI Body mass index is 39.24 kg/(m^2).  Wt Readings from Last 3 Encounters:  06/06/15 243 lb (110.224 kg)  06/03/14 243 lb 12.8 oz (110.587 kg)  04/17/13 246 lb (111.585 kg)    General: Patient appears comfortable at rest. HEENT: Conjunctiva and lids normal, oropharynx clear.  Neck: Supple, no elevated JVP or carotid bruits, no thyromegaly.  Lungs: Clear to auscultation, nonlabored breathing at rest.  Cardiac: Regular rate and rhythm, no S3 or significant systolic murmur, no pericardial rub.  Abdomen: Soft, nontender,bowel sounds present, no  guarding or rebound.  Extremities: No pitting edema, distal pulses 1-2+.   ECG: I personally reviewed the prior tracing from 06/03/2014 which showed normal sinus rhythm.  Recent Labwork:  December 2015: Cholesterol 195, triglycerides 100, HDL 40, LDL 121  Other Studies Reviewed Today:  Echocardiogram 11/16/2010 [facility]); Mild LVH with LVEF 55-60% , no significant valvular abnormalities.  Assessment and Plan:  1. Suspected CAD based on previous type 2 NSTEMI with negative ischemic workup in 2012. We are following her conservatively at this point on medical therapy. Follow-up ECG shows normal sinus rhythm. She has not had any angina symptoms or progressing dyspnea on exertion.  2. Essential hypertension, blood pressure is well controlled today.  3. Hyperlipidemia, currently on fenofibrate and omega-3 supplements. She is followed by Dr. Sherrie Sport.  Current medicines were reviewed with the patient today.   Orders Placed This Encounter  Procedures  . EKG 12-Lead    Disposition: FU with me in 1 year.   Signed, Satira Sark, MD, [facility] 06/06/2015 9:27 AM    [place] [facility] [place] [address], [place], [place] [address] Phone: [phone]; Fax: [phone]

## 2015-06-12 DIAGNOSIS — Z87891 Personal history of nicotine dependence: Secondary | ICD-10-CM | POA: Diagnosis not present

## 2015-06-12 DIAGNOSIS — Z79899 Other long term (current) drug therapy: Secondary | ICD-10-CM | POA: Diagnosis not present

## 2015-06-12 DIAGNOSIS — I10 Essential (primary) hypertension: Secondary | ICD-10-CM | POA: Diagnosis not present

## 2015-06-12 DIAGNOSIS — Z7984 Long term (current) use of oral hypoglycemic drugs: Secondary | ICD-10-CM | POA: Diagnosis not present

## 2015-06-12 DIAGNOSIS — S90121A Contusion of right lesser toe(s) without damage to nail, initial encounter: Secondary | ICD-10-CM | POA: Diagnosis not present

## 2015-06-12 DIAGNOSIS — S9031XA Contusion of right foot, initial encounter: Secondary | ICD-10-CM | POA: Diagnosis not present

## 2015-06-12 DIAGNOSIS — W230XXA Caught, crushed, jammed, or pinched between moving objects, initial encounter: Secondary | ICD-10-CM | POA: Diagnosis not present

## 2015-06-12 DIAGNOSIS — M79671 Pain in right foot: Secondary | ICD-10-CM | POA: Diagnosis not present

## 2015-06-16 DIAGNOSIS — G4733 Obstructive sleep apnea (adult) (pediatric): Secondary | ICD-10-CM | POA: Diagnosis not present

## 2015-06-16 DIAGNOSIS — Z0000 Encounter for general adult medical examination without abnormal findings: Secondary | ICD-10-CM | POA: Diagnosis not present

## 2015-06-16 DIAGNOSIS — E119 Type 2 diabetes mellitus without complications: Secondary | ICD-10-CM | POA: Diagnosis not present

## 2015-06-16 DIAGNOSIS — I10 Essential (primary) hypertension: Secondary | ICD-10-CM | POA: Diagnosis not present

## 2015-06-16 DIAGNOSIS — M25511 Pain in right shoulder: Secondary | ICD-10-CM | POA: Diagnosis not present

## 2015-06-16 DIAGNOSIS — M0589 Other rheumatoid arthritis with rheumatoid factor of multiple sites: Secondary | ICD-10-CM | POA: Diagnosis not present

## 2015-06-16 DIAGNOSIS — Z1389 Encounter for screening for other disorder: Secondary | ICD-10-CM | POA: Diagnosis not present

## 2015-08-23 ENCOUNTER — Encounter (HOSPITAL_COMMUNITY): Payer: Self-pay | Admitting: *Deleted

## 2015-08-23 ENCOUNTER — Inpatient Hospital Stay (HOSPITAL_COMMUNITY)
Admission: EM | Admit: 2015-08-23 | Discharge: 2015-08-26 | DRG: 247 | Disposition: A | Discharge status: Home / Self Care | Payer: Medicare Other | Attending: Cardiology | Admitting: Cardiology

## 2015-08-23 ENCOUNTER — Encounter (HOSPITAL_COMMUNITY)
Admission: EM | Disposition: A | Discharge status: Home / Self Care | Payer: Self-pay | Source: Home / Self Care | Attending: Cardiology

## 2015-08-23 DIAGNOSIS — M1990 Unspecified osteoarthritis, unspecified site: Secondary | ICD-10-CM | POA: Diagnosis not present

## 2015-08-23 DIAGNOSIS — I10 Essential (primary) hypertension: Secondary | ICD-10-CM | POA: Diagnosis not present

## 2015-08-23 DIAGNOSIS — R079 Chest pain, unspecified: Secondary | ICD-10-CM | POA: Diagnosis not present

## 2015-08-23 DIAGNOSIS — Z79899 Other long term (current) drug therapy: Secondary | ICD-10-CM | POA: Diagnosis not present

## 2015-08-23 DIAGNOSIS — Z7984 Long term (current) use of oral hypoglycemic drugs: Secondary | ICD-10-CM

## 2015-08-23 DIAGNOSIS — Z6839 Body mass index (BMI) 39.0-39.9, adult: Secondary | ICD-10-CM

## 2015-08-23 DIAGNOSIS — E669 Obesity, unspecified: Secondary | ICD-10-CM | POA: Diagnosis present

## 2015-08-23 DIAGNOSIS — E785 Hyperlipidemia, unspecified: Secondary | ICD-10-CM | POA: Diagnosis not present

## 2015-08-23 DIAGNOSIS — R0789 Other chest pain: Secondary | ICD-10-CM | POA: Diagnosis not present

## 2015-08-23 DIAGNOSIS — Z7982 Long term (current) use of aspirin: Secondary | ICD-10-CM

## 2015-08-23 DIAGNOSIS — I213 ST elevation (STEMI) myocardial infarction of unspecified site: Secondary | ICD-10-CM | POA: Diagnosis not present

## 2015-08-23 DIAGNOSIS — Z87891 Personal history of nicotine dependence: Secondary | ICD-10-CM

## 2015-08-23 DIAGNOSIS — E1122 Type 2 diabetes mellitus with diabetic chronic kidney disease: Secondary | ICD-10-CM | POA: Diagnosis present

## 2015-08-23 DIAGNOSIS — I129 Hypertensive chronic kidney disease with stage 1 through stage 4 chronic kidney disease, or unspecified chronic kidney disease: Secondary | ICD-10-CM | POA: Diagnosis present

## 2015-08-23 DIAGNOSIS — E782 Mixed hyperlipidemia: Secondary | ICD-10-CM | POA: Diagnosis present

## 2015-08-23 DIAGNOSIS — Z955 Presence of coronary angioplasty implant and graft: Secondary | ICD-10-CM

## 2015-08-23 DIAGNOSIS — I2121 ST elevation (STEMI) myocardial infarction involving left circumflex coronary artery: Secondary | ICD-10-CM | POA: Diagnosis not present

## 2015-08-23 DIAGNOSIS — I2510 Atherosclerotic heart disease of native coronary artery without angina pectoris: Secondary | ICD-10-CM | POA: Diagnosis not present

## 2015-08-23 DIAGNOSIS — D500 Iron deficiency anemia secondary to blood loss (chronic): Secondary | ICD-10-CM | POA: Diagnosis present

## 2015-08-23 DIAGNOSIS — N183 Chronic kidney disease, stage 3 (moderate): Secondary | ICD-10-CM | POA: Diagnosis present

## 2015-08-23 DIAGNOSIS — Z8673 Personal history of transient ischemic attack (TIA), and cerebral infarction without residual deficits: Secondary | ICD-10-CM

## 2015-08-23 DIAGNOSIS — I2119 ST elevation (STEMI) myocardial infarction involving other coronary artery of inferior wall: Principal | ICD-10-CM

## 2015-08-23 DIAGNOSIS — D62 Acute posthemorrhagic anemia: Secondary | ICD-10-CM | POA: Diagnosis not present

## 2015-08-23 HISTORY — PX: CARDIAC CATHETERIZATION: SHX172

## 2015-08-23 LAB — GLUCOSE, CAPILLARY: GLUCOSE-CAPILLARY: 174 mg/dL — AB (ref 65–99)

## 2015-08-23 LAB — CBC
HCT: 32.8 % — ABNORMAL LOW (ref 36.0–46.0)
Hemoglobin: 10.4 g/dL — ABNORMAL LOW (ref 12.0–15.0)
MCH: 28 pg (ref 26.0–34.0)
MCHC: 31.7 g/dL (ref 30.0–36.0)
MCV: 88.2 fL (ref 78.0–100.0)
PLATELETS: 378 10*3/uL (ref 150–400)
RBC: 3.72 MIL/uL — AB (ref 3.87–5.11)
RDW: 15.3 % (ref 11.5–15.5)
WBC: 12.9 10*3/uL — AB (ref 4.0–10.5)

## 2015-08-23 LAB — CREATININE, SERUM
CREATININE: 1.64 mg/dL — AB (ref 0.44–1.00)
GFR, EST AFRICAN AMERICAN: 35 mL/min — AB (ref >60)
GFR, EST NON AFRICAN AMERICAN: 30 mL/min — AB (ref >60)

## 2015-08-23 LAB — TROPONIN I: TROPONIN I: 0.25 ng/mL — AB (ref <0.031)

## 2015-08-23 SURGERY — LEFT HEART CATH AND CORONARY ANGIOGRAPHY
Anesthesia: LOCAL

## 2015-08-23 MED ORDER — METOPROLOL TARTRATE 25 MG PO TABS
25.0000 mg | ORAL_TABLET | Freq: Two times a day (BID) | ORAL | Status: DC
  Administered 2015-08-23 – 2015-08-25 (×4): 25 mg via ORAL
  Filled 2015-08-23 (×4): qty 1

## 2015-08-23 MED ORDER — HEPARIN (PORCINE) IN NACL 2-0.9 UNIT/ML-% IJ SOLN
INTRAMUSCULAR | Status: DC | PRN
Start: 2015-08-23 — End: 2015-08-23
  Administered 2015-08-23: 1000 mL

## 2015-08-23 MED ORDER — TIROFIBAN HCL IN NACL 5-0.9 MG/100ML-% IV SOLN
0.0750 ug/kg/min | INTRAVENOUS | Status: AC
  Administered 2015-08-23 – 2015-08-24 (×2): 0.075 ug/kg/min via INTRAVENOUS
  Filled 2015-08-23: qty 100

## 2015-08-23 MED ORDER — SODIUM CHLORIDE 0.9% FLUSH: 3.0000 mL | INTRAVENOUS | Status: DC | PRN

## 2015-08-23 MED ORDER — ISOSORBIDE DINITRATE 5 MG PO TABS
15.0000 mg | ORAL_TABLET | Freq: Two times a day (BID) | ORAL | Status: DC
  Administered 2015-08-23 – 2015-08-24 (×3): 15 mg via ORAL
  Filled 2015-08-23 (×3): qty 2
  Filled 2015-08-23: qty 1

## 2015-08-23 MED ORDER — TIROFIBAN (AGGRASTAT) BOLUS VIA INFUSION
INTRAVENOUS | Status: DC | PRN
  Administered 2015-08-23: 2750 ug via INTRAVENOUS

## 2015-08-23 MED ORDER — ASPIRIN EC 81 MG PO TBEC
81.0000 mg | DELAYED_RELEASE_TABLET | Freq: Every day | ORAL | Status: DC
  Administered 2015-08-24 – 2015-08-26 (×3): 81 mg via ORAL
  Filled 2015-08-23 (×3): qty 1

## 2015-08-23 MED ORDER — ASPIRIN 81 MG PO TABS: 81.0000 mg | ORAL_TABLET | Freq: Every day | ORAL | Status: DC

## 2015-08-23 MED ORDER — LOSARTAN POTASSIUM 25 MG PO TABS
25.0000 mg | ORAL_TABLET | Freq: Every day | ORAL | Status: DC
  Administered 2015-08-24 – 2015-08-26 (×3): 25 mg via ORAL
  Filled 2015-08-23 (×3): qty 1

## 2015-08-23 MED ORDER — FUROSEMIDE 20 MG PO TABS
20.0000 mg | ORAL_TABLET | Freq: Every day | ORAL | Status: DC
  Administered 2015-08-24 – 2015-08-26 (×3): 20 mg via ORAL
  Filled 2015-08-23 (×3): qty 1

## 2015-08-23 MED ORDER — HEPARIN SODIUM (PORCINE) 5000 UNIT/ML IJ SOLN
5000.0000 [IU] | Freq: Three times a day (TID) | INTRAMUSCULAR | Status: DC
Start: 2015-08-24 — End: 2015-08-23

## 2015-08-23 MED ORDER — SODIUM CHLORIDE 0.9% FLUSH
3.0000 mL | Freq: Two times a day (BID) | INTRAVENOUS | Status: DC
  Administered 2015-08-24 – 2015-08-26 (×4): 3 mL via INTRAVENOUS

## 2015-08-23 MED ORDER — TICAGRELOR 90 MG PO TABS
90.0000 mg | ORAL_TABLET | Freq: Two times a day (BID) | ORAL | Status: DC
  Administered 2015-08-24 – 2015-08-26 (×5): 90 mg via ORAL
  Filled 2015-08-23 (×5): qty 1

## 2015-08-23 MED ORDER — FENTANYL CITRATE (PF) 100 MCG/2ML IJ SOLN
INTRAMUSCULAR | Status: DC | PRN
  Administered 2015-08-23 (×3): 25 ug via INTRAVENOUS

## 2015-08-23 MED ORDER — TICAGRELOR 90 MG PO TABS
ORAL_TABLET | ORAL | Status: DC | PRN
  Administered 2015-08-23: 180 mg via ORAL

## 2015-08-23 MED ORDER — LIDOCAINE HCL (PF) 1 % IJ SOLN
INTRAMUSCULAR | Status: DC | PRN
  Administered 2015-08-23: 3 mL

## 2015-08-23 MED ORDER — SODIUM CHLORIDE 0.9 % WEIGHT BASED INFUSION
1.0000 mL/kg/h | INTRAVENOUS | Status: AC
Start: 2015-08-23 — End: 2015-08-24
  Administered 2015-08-23: 1 mL/kg/h via INTRAVENOUS

## 2015-08-23 MED ORDER — TIROFIBAN HCL IN NACL 5-0.9 MG/100ML-% IV SOLN: 0.1500 ug/kg/min | INTRAVENOUS | Status: DC

## 2015-08-23 MED ORDER — FENOFIBRATE 160 MG PO TABS
160.0000 mg | ORAL_TABLET | Freq: Every day | ORAL | Status: DC
  Administered 2015-08-24 – 2015-08-26 (×3): 160 mg via ORAL
  Filled 2015-08-23 (×3): qty 1

## 2015-08-23 MED ORDER — POTASSIUM CHLORIDE CRYS ER 20 MEQ PO TBCR
20.0000 meq | EXTENDED_RELEASE_TABLET | Freq: Every day | ORAL | Status: DC
  Administered 2015-08-24 – 2015-08-26 (×3): 20 meq via ORAL
  Filled 2015-08-23 (×3): qty 1

## 2015-08-23 MED ORDER — HEPARIN SODIUM (PORCINE) 1000 UNIT/ML IJ SOLN
INTRAMUSCULAR | Status: DC | PRN
  Administered 2015-08-23: 7000 [IU] via INTRAVENOUS
  Administered 2015-08-23: 2000 [IU] via INTRAVENOUS

## 2015-08-23 MED ORDER — IOPAMIDOL (ISOVUE-370) INJECTION 76%
INTRAVENOUS | Status: DC | PRN
  Administered 2015-08-23: 195 mL via INTRA_ARTERIAL

## 2015-08-23 MED ORDER — ATORVASTATIN CALCIUM 80 MG PO TABS
80.0000 mg | ORAL_TABLET | Freq: Every day | ORAL | Status: DC
  Administered 2015-08-24 – 2015-08-25 (×2): 80 mg via ORAL
  Filled 2015-08-23 (×2): qty 1

## 2015-08-23 MED ORDER — HEPARIN SODIUM (PORCINE) 5000 UNIT/ML IJ SOLN
5000.0000 [IU] | Freq: Three times a day (TID) | INTRAMUSCULAR | Status: DC
  Administered 2015-08-24 – 2015-08-26 (×7): 5000 [IU] via SUBCUTANEOUS
  Filled 2015-08-23 (×7): qty 1

## 2015-08-23 MED ORDER — TIROFIBAN HCL IN NACL 5-0.9 MG/100ML-% IV SOLN
INTRAVENOUS | Status: DC | PRN
  Administered 2015-08-23: 0.15 ug/kg/min via INTRAVENOUS

## 2015-08-23 MED ORDER — VERAPAMIL HCL 2.5 MG/ML IV SOLN
INTRAVENOUS | Status: DC | PRN
  Administered 2015-08-23: 21:00:00 via INTRA_ARTERIAL

## 2015-08-23 MED ORDER — MIDAZOLAM HCL 2 MG/2ML IJ SOLN
INTRAMUSCULAR | Status: DC | PRN
  Administered 2015-08-23 (×3): 1 mg via INTRAVENOUS

## 2015-08-23 MED ORDER — SODIUM CHLORIDE 0.9 % IV SOLN: 250.0000 mL | INTRAVENOUS | Status: DC | PRN

## 2015-08-23 MED ORDER — CALCIUM CARBONATE-VITAMIN D 500-200 MG-UNIT PO TABS
1.0000 | ORAL_TABLET | Freq: Every day | ORAL | Status: DC
  Administered 2015-08-24 – 2015-08-26 (×3): 1 via ORAL
  Filled 2015-08-23 (×3): qty 1

## 2015-08-23 MED ORDER — SODIUM CHLORIDE 0.9 % WEIGHT BASED INFUSION
1.0000 mL/kg/h | INTRAVENOUS | Status: DC
  Administered 2015-08-23: 1 mL/kg/h via INTRAVENOUS

## 2015-08-23 MED ORDER — INSULIN ASPART 100 UNIT/ML ~~LOC~~ SOLN
0.0000 [IU] | Freq: Three times a day (TID) | SUBCUTANEOUS | Status: DC
  Administered 2015-08-24: 3 [IU] via SUBCUTANEOUS
  Administered 2015-08-25 – 2015-08-26 (×5): 2 [IU] via SUBCUTANEOUS

## 2015-08-23 MED ORDER — ACETAMINOPHEN 325 MG PO TABS: 650.0000 mg | ORAL_TABLET | ORAL | Status: DC | PRN

## 2015-08-23 MED ORDER — METAXALONE 800 MG PO TABS
800.0000 mg | ORAL_TABLET | Freq: Two times a day (BID) | ORAL | Status: DC
  Administered 2015-08-24 – 2015-08-26 (×5): 800 mg via ORAL
  Filled 2015-08-23 (×6): qty 1

## 2015-08-23 MED ORDER — ONDANSETRON HCL 4 MG/2ML IJ SOLN: 4.0000 mg | Freq: Four times a day (QID) | INTRAMUSCULAR | Status: DC | PRN

## 2015-08-23 SURGICAL SUPPLY — 17 items
BALLN EUPHORA RX 2.5X15 (BALLOONS) ×2
BALLOON EUPHORA RX 2.5X15 (BALLOONS) ×1 IMPLANT
CATH EXTRAC PRONTO 5.5F 138CM (CATHETERS) ×2 IMPLANT
CATH INFINITI 5FR ANG PIGTAIL (CATHETERS) ×2 IMPLANT
CATH OPTITORQUE TIG 4.0 5F (CATHETERS) ×2 IMPLANT
CATH VISTA GUIDE 6FR XBLAD3.5 (CATHETERS) ×2 IMPLANT
DEVICE RAD COMP TR BAND LRG (VASCULAR PRODUCTS) ×2 IMPLANT
ELECT DEFIB PAD ADLT CADENCE (PAD) ×2 IMPLANT
GLIDESHEATH SLEND A-KIT 6F 22G (SHEATH) ×2 IMPLANT
KIT ENCORE 26 ADVANTAGE (KITS) ×2 IMPLANT
KIT HEART LEFT (KITS) ×2 IMPLANT
PACK CARDIAC CATHETERIZATION (CUSTOM PROCEDURE TRAY) ×2 IMPLANT
STENT PROMUS PREM MR 3.5X12 (Permanent Stent) ×2 IMPLANT
TRANSDUCER W/STOPCOCK (MISCELLANEOUS) ×2 IMPLANT
TUBING CIL FLEX 10 FLL-RA (TUBING) ×2 IMPLANT
WIRE ASAHI PROWATER 180CM (WIRE) ×2 IMPLANT
WIRE SAFE-T 1.5MM-J .035X260CM (WIRE) ×2 IMPLANT

## 2015-08-23 NOTE — H&P (Addendum)
History and Physical Note:   NAME:  Diana Patterson   MRN: [id number] DOB:  [birth year]   ADMIT DATE: (Not on file) PCP: Neale Burly, MD  CARDIOLOGIST: Rozann Lesches, MD   08/23/2015 8:14 PM  Diana Patterson Diana Patterson is a 70 y.o. female with a history of presumed type II MI back in March 2016 with a negative Myoview as a follow-up. She has multiple cardiac risk factors as noted below. She was in her usual state of health until roughly 6:00 this evening when she started having the side chest pain and nausea. She presented to [facility] with left-sided chest pain, headache and nausea by roughly 7 PM. She was found to have ST elevations in leads II and III. Code STEMI was activated and the patient was transferred to [facility] trek to the cardiac catheterization lab for invasive evaluation with cardiac catheterization.  While at [facility] she was hemodynamically stable and she received 4000 units heparin and 324 100 over aspirin. Labs were drawn at [facility] Upon arrival to [facility] at 8:20 PM, she was still having 7-9 out of 10 pain - was not given any morphine due to reported allergy.   Past Medical History  Diagnosis Date  . NSTEMI (non-ST elevated myocardial infarction) ([place])     Probable type II event, negative Cardiolite  . Essential hypertension, benign   . Mixed hyperlipidemia   . Type 2 diabetes mellitus ([place])   . TIA (transient ischemic attack)   . CKD (chronic Patterson disease) stage 3, GFR 30-59 ml/min     Dr. Lowanda Foster   Past Surgical History  Procedure Laterality Date  . Tubal pregnancy removal      FAMHx: Family History  Problem Relation Age of Onset  . Heart attack Father   . Diabetes Sister     SOCHx:  reports that she quit smoking about 37 years ago. Her smoking use included Cigarettes. She has a 20 pack-year smoking history. She has never used smokeless tobacco. She reports that she does not drink alcohol or use illicit  drugs.  ALLERGIES: Allergies  Allergen Reactions  . Morphine And Related Other (See Comments)    HOME MEDICATIONS: Prior to Admission medications   Medication Sig Start Date End Date Taking? Authorizing Provider  aspirin 81 MG tablet Take 81 mg by mouth daily.      Historical Provider, MD  Calcium Carb-Cholecalciferol (CALCIUM 1000 + D) 1000-800 MG-UNIT TABS Take 1 tablet by mouth daily.     Historical Provider, MD  Choline Fenofibrate (TRILIPIX) 135 MG capsule Take 135 mg by mouth daily.      Historical Provider, MD  folic acid (FOLVITE) 1 MG tablet Take 1 mg by mouth daily.    Historical Provider, MD  furosemide (LASIX) 20 MG tablet Take 20 mg by mouth as needed.    Historical Provider, MD  isosorbide dinitrate (ISORDIL) 30 MG tablet Take 15 mg by mouth daily.    Historical Provider, MD  losartan (COZAAR) 25 MG tablet Take 25 mg by mouth daily.      Historical Provider, MD  metaxalone (SKELAXIN) 800 MG tablet Take 800 mg by mouth 2 (two) times daily.     Historical Provider, MD  metFORMIN (GLUCOPHAGE) 500 MG tablet Take 500 mg by mouth 2 (two) times daily with a meal.    Historical Provider, MD  methotrexate (RHEUMATREX) 2.5 MG tablet Take 2.5 mg by mouth once a week. 6 tabs once weekly  Historical Provider, MD  metoprolol tartrate (LOPRESSOR) 25 MG tablet Take 25 mg by mouth 2 (two) times daily.    Historical Provider, MD  Omega-3 Fatty Acids (FISH OIL) 1000 MG CAPS Take 1 capsule by mouth 2 (two) times daily.     Historical Provider, MD  POLYSACCH FE COMPLEX-VIT D3 PO Take 1 tablet by mouth daily.    Historical Provider, MD  Polysaccharide Iron Complex (POLY-IRON 150 PO) Take by mouth daily.    Historical Provider, MD  potassium chloride SA (K-DUR,KLOR-CON) 20 MEQ tablet Take 20 mEq by mouth daily.    Historical Provider, MD   Review of Systems  Constitutional: Negative for fever, chills and malaise/fatigue.  HENT: Negative for congestion and nosebleeds.   Eyes: Negative for  blurred vision and double vision.  Respiratory: Negative for cough, sputum production and wheezing.   Cardiovascular: Positive for chest pain.       Negative until onset of Sx in HPI  Gastrointestinal: Positive for nausea. Negative for blood in stool and melena.  Genitourinary: Negative for hematuria.  Musculoskeletal: Positive for joint pain (Arthritis).  Neurological: Positive for headaches. Negative for dizziness and loss of consciousness.  Psychiatric/Behavioral: Negative for depression and memory loss. The patient is not nervous/anxious and does not have insomnia.   All other systems reviewed and are negative.   PHYSICAL EXAM:There were no vitals taken for this visit. General: Patient appears to be in notable discomfort. Notes 7/10 pain HEENT: Conjunctiva and lids normal, oropharynx clear.  Neck: Supple, no elevated JVP or carotid bruits, no thyromegaly.  Lungs: Clear to auscultation, nonlabored breathing at rest.  Cardiac: RRR, normal S1 and S2. no S3 or significant systolic murmur, no pericardial rub.  Abdomen: Soft, nontender,bowel sounds present, no guarding or rebound.  Extremities: No pitting edema, distal pulses 1-2+.  Neuro: CN II-XII grossly intact   Adult ECG Report- EMS strip  Rate: 69 ;  Rhythm: normal sinus rhythm - 1.5-2 mm ST elevations in leads II, III and aVF. Roughly 1 mm ST elevations in leads V4-V6.  IMPRESSION & PLAN Diana Patterson has presented today for surgery, with the diagnosis of inferior STEMI. The various methods of treatment have been discussed with the patient and family.   Risks / Complications include, but not limited to: Death, MI, CVA/TIA, VF/VT (with defibrillation), Bradycardia (need for temporary pacer placement), contrast induced nephropathy, bleeding / bruising / hematoma / pseudoaneurysm, vascular or coronary injury (with possible emergent CT or Vascular Surgery), adverse medication reactions, infection.     After  consideration of risks, benefits and other options for treatment, the patient has consented to Procedure(s): Verbal consent implied LEFT HEART CATHETERIZATION AND CORONARY ANGIOGRAPHY +/- AD [employer]  as a surgical intervention.   We will proceed with the planned procedure.  Further plans pending results of catheterization. Will need SSI as we will hold Metformin x 48 hrs.   [facility] [address] [place], [place]  [address]  [phone]  08/23/2015 8:14 PM

## 2015-08-23 NOTE — Progress Notes (Signed)
Pharmacy note: tirofiban  70 yo female s/p STEMI with stent on tirofiban for 12 hours post cath. SCr was 1.5 in cath lab, CrCl ~ 45  Plan -Reduce tirofiban to 0.02mcg/kg/min -CBC in am  Hildred Laser, Pharm D 08/23/2015 10:44 PM

## 2015-08-23 NOTE — Progress Notes (Signed)
   08/23/15 2100  Clinical Encounter Type  Visited With Family  Visit Type Social support  Referral From Nurse  Consult/Referral To Chaplain  Spiritual Encounters  Spiritual Needs Emotional  [facility] responded to STEMI. Escorted family to Cath Lab waiting area. [facility] kept family updated until [facility] reported out to them. Roe Coombs  08/23/2015

## 2015-08-24 LAB — BASIC METABOLIC PANEL
Anion gap: 10 (ref 5–15)
BUN: 26 mg/dL — AB (ref 6–20)
CALCIUM: 9.5 mg/dL (ref 8.9–10.3)
CO2: 23 mmol/L (ref 22–32)
CREATININE: 1.49 mg/dL — AB (ref 0.44–1.00)
Chloride: 104 mmol/L (ref 101–111)
GFR calc Af Amer: 40 mL/min — ABNORMAL LOW (ref >60)
GFR calc non Af Amer: 34 mL/min — ABNORMAL LOW (ref >60)
GLUCOSE: 140 mg/dL — AB (ref 65–99)
Potassium: 3.8 mmol/L (ref 3.5–5.1)
Sodium: 137 mmol/L (ref 135–145)

## 2015-08-24 LAB — GLUCOSE, CAPILLARY
GLUCOSE-CAPILLARY: 118 mg/dL — AB (ref 65–99)
Glucose-Capillary: 123 mg/dL — ABNORMAL HIGH (ref 65–99)
Glucose-Capillary: 185 mg/dL — ABNORMAL HIGH (ref 65–99)
Glucose-Capillary: 132 mg/dL — ABNORMAL HIGH (ref 65–99)

## 2015-08-24 LAB — TROPONIN I
TROPONIN I: 2.38 ng/mL — AB (ref <0.031)
TROPONIN I: 5.42 ng/mL — AB (ref <0.031)
Troponin I: 6.09 ng/mL (ref <0.031)

## 2015-08-24 LAB — CBC
HCT: 29.9 % — ABNORMAL LOW (ref 36.0–46.0)
Hemoglobin: 9.6 g/dL — ABNORMAL LOW (ref 12.0–15.0)
MCH: 28.9 pg (ref 26.0–34.0)
MCHC: 32.1 g/dL (ref 30.0–36.0)
MCV: 90.1 fL (ref 78.0–100.0)
PLATELETS: 346 10*3/uL (ref 150–400)
RBC: 3.32 MIL/uL — ABNORMAL LOW (ref 3.87–5.11)
RDW: 15.9 % — ABNORMAL HIGH (ref 11.5–15.5)
WBC: 11.1 10*3/uL — ABNORMAL HIGH (ref 4.0–10.5)

## 2015-08-24 LAB — MRSA PCR SCREENING: MRSA BY PCR: NEGATIVE

## 2015-08-24 NOTE — Progress Notes (Signed)
Patient ID: Juli Carro, female   DOB: 1945-12-04, 70 y.o.   MRN: [id number]    Patient Name: Diana Patterson Date of Encounter: 08/24/2015     Principal Problem:   ST elevation myocardial infarction (STEMI) of inferior wall, initial episode of care [facility]) Active Problems:   ST elevation myocardial infarction (STEMI) of inferior wall ([place])    SUBJECTIVE  No additional chest pain or sob.  CURRENT MEDS . aspirin EC  81 mg Oral Daily  . atorvastatin  80 mg Oral q1800  . calcium-vitamin D  1 tablet Oral Daily  . fenofibrate  160 mg Oral Daily  . furosemide  20 mg Oral Daily  . heparin  5,000 Units Subcutaneous Q8H  . insulin aspart  0-15 Units Subcutaneous TID WC  . isosorbide dinitrate  15 mg Oral BID  . losartan  25 mg Oral Daily  . metaxalone  800 mg Oral BID  . metoprolol tartrate  25 mg Oral BID  . potassium chloride SA  20 mEq Oral Daily  . sodium chloride flush  3 mL Intravenous Q12H  . ticagrelor  90 mg Oral BID    OBJECTIVE  Filed Vitals:   08/24/15 0400 08/24/15 0500 08/24/15 0600 08/24/15 0700  BP: 115/63 130/64 124/59 123/71  Pulse: 82 84 73 74  Temp: 98.2 F (36.8 C)     TempSrc: Oral     Resp: 25 28 19 23   Weight:      SpO2: 100% 99% 99% 98%    Intake/Output Summary (Last 24 hours) at 08/24/15 0908 Last data filed at 08/24/15 0700  Gross per 24 hour  Intake    864 ml  Output    875 ml  Net    -11 ml   Filed Weights   08/23/15 2133  Weight: 242 lb 8.1 oz (110 kg)    PHYSICAL EXAM  General: Pleasant, obese, NAD. Neuro: Alert and oriented X 3. Moves all extremities spontaneously. Psych: Normal affect. HEENT:  Normal  Neck: Supple without bruits or JVD. Lungs:  Resp regular and unlabored, CTA. Heart: RRR no s3, s4, or murmurs. Abdomen: Soft, non-tender, non-distended, BS + x 4.  Extremities: No clubbing, cyanosis or edema. DP/PT/Radials 2+ and equal bilaterally. No ecchymosis  Accessory Clinical Findings  CBC  Recent  Labs  08/23/15 2221 08/24/15 0415  WBC 12.9* 11.1*  HGB 10.4* 9.6*  HCT 32.8* 29.9*  MCV 88.2 90.1  PLT [id number]   Basic Metabolic Panel  Recent Labs  08/23/15 2221 08/24/15 0415  NA  --  137  K  --  3.8  CL  --  104  CO2  --  23  GLUCOSE  --  140*  BUN  --  26*  CREATININE 1.64* 1.49*  CALCIUM  --  9.5   Liver Function Tests No results for input(s): AST, ALT, ALKPHOS, BILITOT, PROT, ALBUMIN in the last 72 hours. No results for input(s): LIPASE, AMYLASE in the last 72 hours. Cardiac Enzymes  Recent Labs  08/23/15 2221 08/24/15 0415  TROPONINI 0.25* 2.38*   BNP Invalid input(s): POCBNP D-Dimer No results for input(s): DDIMER in the last 72 hours. Hemoglobin A1C No results for input(s): HGBA1C in the last 72 hours. Fasting Lipid Panel No results for input(s): CHOL, HDL, LDLCALC, TRIG, CHOLHDL, LDLDIRECT in the last 72 hours. Thyroid Function Tests No results for input(s): TSH, T4TOTAL, T3FREE, THYROIDAB in the last 72 hours.  Invalid input(s): FREET3  TELE  nsr  Radiology/Studies  No results found.  ASSESSMENT AND PLAN  1. Inferolateral STEMI - she is s/p PCI. No additional pain. Will watch in ICU today. No change in medical therapy. Start cardiac rehab 2. HTN heart disease - continue beta blocker 3. Obesity - we discussed life style modification.  Gregg Taylor,M.D.  08/24/2015 9:08 AM

## 2015-08-25 ENCOUNTER — Encounter (HOSPITAL_COMMUNITY): Payer: Self-pay | Admitting: Cardiology

## 2015-08-25 ENCOUNTER — Inpatient Hospital Stay (HOSPITAL_COMMUNITY): Payer: Medicare Other

## 2015-08-25 DIAGNOSIS — E1122 Type 2 diabetes mellitus with diabetic chronic kidney disease: Secondary | ICD-10-CM

## 2015-08-25 DIAGNOSIS — D62 Acute posthemorrhagic anemia: Secondary | ICD-10-CM

## 2015-08-25 DIAGNOSIS — I2510 Atherosclerotic heart disease of native coronary artery without angina pectoris: Secondary | ICD-10-CM

## 2015-08-25 DIAGNOSIS — N183 Chronic kidney disease, stage 3 (moderate): Secondary | ICD-10-CM

## 2015-08-25 LAB — POCT I-STAT, CHEM 8
BUN: 29 mg/dL — ABNORMAL HIGH (ref 6–20)
CALCIUM ION: 1.36 mmol/L — AB (ref 1.13–1.30)
Chloride: 101 mmol/L (ref 101–111)
Creatinine, Ser: 1.5 mg/dL — ABNORMAL HIGH (ref 0.44–1.00)
Glucose, Bld: 196 mg/dL — ABNORMAL HIGH (ref 65–99)
HCT: 35 % — ABNORMAL LOW (ref 36.0–46.0)
HEMOGLOBIN: 11.9 g/dL — AB (ref 12.0–15.0)
Potassium: 3.7 mmol/L (ref 3.5–5.1)
SODIUM: 139 mmol/L (ref 135–145)
TCO2: 24 mmol/L (ref 0–100)

## 2015-08-25 LAB — GLUCOSE, CAPILLARY
GLUCOSE-CAPILLARY: 124 mg/dL — AB (ref 65–99)
GLUCOSE-CAPILLARY: 135 mg/dL — AB (ref 65–99)
GLUCOSE-CAPILLARY: 134 mg/dL — AB (ref 65–99)
Glucose-Capillary: 121 mg/dL — ABNORMAL HIGH (ref 65–99)

## 2015-08-25 LAB — HEMOGLOBIN A1C
Hgb A1c MFr Bld: 6.5 % — ABNORMAL HIGH (ref 4.8–5.6)
MEAN PLASMA GLUCOSE: 140 mg/dL

## 2015-08-25 LAB — POCT ACTIVATED CLOTTING TIME
ACTIVATED CLOTTING TIME: 250 s
ACTIVATED CLOTTING TIME: 281 s

## 2015-08-25 MED ORDER — ISOSORBIDE DINITRATE 30 MG PO TABS
30.0000 mg | ORAL_TABLET | Freq: Two times a day (BID) | ORAL | Status: DC
  Administered 2015-08-25 (×2): 30 mg via ORAL
  Filled 2015-08-25 (×3): qty 1

## 2015-08-25 MED ORDER — METOPROLOL TARTRATE 25 MG PO TABS
37.5000 mg | ORAL_TABLET | Freq: Two times a day (BID) | ORAL | Status: DC
  Administered 2015-08-25: 37.5 mg via ORAL
  Filled 2015-08-25 (×2): qty 1

## 2015-08-25 MED FILL — Verapamil HCl IV Soln 2.5 MG/ML: INTRAVENOUS | Qty: 2 | Status: AC

## 2015-08-25 NOTE — Progress Notes (Signed)
CARDIAC REHAB PHASE I   PRE:  Rate/Rhythm: 78 SR  BP:  Supine: 122/54  Sitting:   Standing:    SaO2: 98%RA  MODE:  Ambulation: 460 ft   POST:  Rate/Rhythm: 93  BP:  Supine:   Sitting: 129/54  Standing:    SaO2: 100%RA 0904-1027 Pt walked 460 ft on RA with steady gait. Hip started bothering her so she had to take a couple of standing rest breaks. Stated she has arthritis. Denied CP but did c/o some SOB after walk. Sats good on RA. To recliner after walk. MI education completed with pt and husband who voiced understanding. Stressed importance of brilinta with stent and case manager going to see pt to give stent card. Discussed carb counting and heart healthy diets. Reviewed NTG use, risk factors ex ed and CRP 2. Will refer to [facility] Phase 2.   Graylon Good, RN BSN  08/25/2015 10:21 AM

## 2015-08-25 NOTE — Care Management Note (Addendum)
Case Management Note Marvetta Gibbons RN, BSN Unit 2W-Case Manager [phone]  Patient Details  Name: Zelie Pilgrim MRN: [id number] Date of Birth: January 18, 1946  Subjective/Objective:   Pt admitted with STEMI s/p stent placement                 Action/Plan: PTA pt lived at home with family- independent- anticipate return home- pt has been started on Brilinta - insurance check attempted but unable to complete- pt has [place]- spoke with pt at bedside- and confirmed that pt does not have any other drug coverage other than her [employer] benefits- per pt she gets most of her meds via mail order from [facility]- her PCP- DR. Hasanaj- sends in the approval needed to [employer] for her meds- she also uses [facility] if needed for short term medications- call placed to pt's PCP office and message left regarding need to have paperwork sent into [employer] mail order for Brilinta- pt has been given 30 day free card to use on discharge for Brilinta will need script with no refills to use with card.   Expected Discharge Date:                  Expected Discharge Plan:  Home/Self Care  In-House Referral:     Discharge planning Services  CM Consult, Medication Assistance  Post Acute Care Choice:    Choice offered to:     DME Arranged:    DME Agency:     HH Arranged:    HH Agency:     Status of Service:  In process, will continue to follow  Medicare Important Message Given:    Date Medicare IM Given:    Medicare IM give by:    Date Additional Medicare IM Given:    Additional Medicare Important Message give by:     If discussed at [place] of Stay Meetings, dates discussed:    Additional Comments:  08/25/15- 1500- Update- Hind Chesler- RN, CM- received return call from pt's PCP office- spoke with Amy RN w/ Dr. Sherrie Sport- per Amy office will start process for Brilinta with pt's meds by mail with [facility]. - needed info given to Amy via TC. - pt updated at bedside  Dahlia Client Romeo Rabon,  RN 08/25/2015, 11:48 AM

## 2015-08-25 NOTE — Progress Notes (Signed)
Subjective:  DAY 2 S/P inferolateral STEMI;  Objective:   Vital Signs : Filed Vitals:   08/25/15 0652 08/25/15 0700 08/25/15 0718 08/25/15 0841  BP:   117/67 128/58  Pulse:   69 82  Temp:   97.9 F (36.6 C)   TempSrc:   Oral   Resp: 22 17 18    Weight:      SpO2:   97%     Intake/Output from previous day:  Intake/Output Summary (Last 24 hours) at 08/25/15 0901 Last data filed at 08/25/15 0645  Gross per 24 hour  Intake  749.9 ml  Output   3525 ml  Net -2775.1 ml    I/O since admission: -2526  Wt Readings from Last 3 Encounters:  08/23/15 242 lb 8.1 oz (110 kg)  06/06/15 243 lb (110.224 kg)  06/03/14 243 lb 12.8 oz (110.587 kg)    Medications: . aspirin EC  81 mg Oral Daily  . atorvastatin  80 mg Oral q1800  . calcium-vitamin D  1 tablet Oral Daily  . fenofibrate  160 mg Oral Daily  . furosemide  20 mg Oral Daily  . heparin  5,000 Units Subcutaneous Q8H  . insulin aspart  0-15 Units Subcutaneous TID WC  . isosorbide dinitrate  15 mg Oral BID  . losartan  25 mg Oral Daily  . metaxalone  800 mg Oral BID  . metoprolol tartrate  25 mg Oral BID  . potassium chloride SA  20 mEq Oral Daily  . sodium chloride flush  3 mL Intravenous Q12H  . ticagrelor  90 mg Oral BID       Physical Exam:   General appearance: alert, cooperative and no distress Neck: no adenopathy, no carotid bruit, no JVD, supple, symmetrical, trachea midline and thyroid not enlarged, symmetric, no tenderness/mass/nodules Lungs: clear to auscultation bilaterally Heart: regular rate and rhythm and 1/6 systolic murmur; no diatolic murmur Abdomen: moderate central adipositysoft, non-tender; bowel sounds normal; no masses,  no organomegaly Extremities: no edema, redness or tenderness in the calves or thighs Pulses: 2+ and symmetric; R radial cath site stable Skin: Skin color, texture, turgor normal. No rashes or lesions Neurologic: Grossly normal   Rate: 74  Rhythm: normal sinus  rhythm  08/24/2015 ECG (independently read by me): NSR at 76 ; Q in III; QTc 487 msec  Lab Results:   Recent Labs  08/23/15 2040 08/23/15 2221 08/24/15 0415  NA 139  --  137  K 3.7  --  3.8  CL 101  --  104  CO2  --   --  23  GLUCOSE 196*  --  140*  BUN 29*  --  26*  CREATININE 1.50* 1.64* 1.49*  CALCIUM  --   --  9.5    No flowsheet data found.   Recent Labs  08/23/15 2040 08/23/15 2221 08/24/15 0415  WBC  --  12.9* 11.1*  HGB 11.9* 10.4* 9.6*  HCT 35.0* 32.8* 29.9*  MCV  --  88.2 90.1  PLT  --  378 346     Recent Labs  08/24/15 0415 08/24/15 0945 08/24/15 1551  TROPONINI 2.38* 6.09* 5.42*    No results found for: TSH No results for input(s): HGBA1C in the last 72 hours.  No results for input(s): PROT, ALBUMIN, AST, ALT, ALKPHOS, BILITOT, BILIDIR, IBILI in the last 72 hours. No results for input(s): INR in the last 72 hours. BNP (last 3 results) No results for input(s): BNP in the last 8760 hours.  ProBNP (last 3 results) No results for input(s): PROBNP in the last 8760 hours.   Lipid Panel  No results found for: CHOL, TRIG, HDL, CHOLHDL, VLDL, LDLCALC, LDLDIRECT    Imaging:  No results found.  EMERGENT CARDIAC CATH: 08/23/2015 Conclusion     Prox Cx to Mid Cx lesion, 90% stenosed. Post intervention aspiration thrombectomy followed by Promus Premier DES 3.5 mm x 12 mm (3.75 mm) , there is a 0% residual stenosis.  Prox LAD-2 lesion, 50-75% stenosed.  The left ventricular systolic function is normal.  Elevated LVEDP   Likely culprit lesion was the thrombotic stenosis of the circumflex. It would appear that there was reperfusion after heparin and aspirin was given in the emergency room. This would be consistent with her improved chest pain upon arrival.  Successful PCI of the proximal dominant Circumflex. There is residual disease in the proximal-mid LAD prior to D1. This does not appear to be flow-limiting, however would probably benefit  from being investigated. I would determine course of action based on her symptoms. If she is a symptomatically, could consider discharge with plans for outpatient Myoview to basket the LAD, otherwise if she has symptoms of angina following this PCI, I would proceed with staged PCI of the LAD prior to her discharge.  Plan: Admit to ICU. Aggrastat for 12 hours Aspirin plus Brilinta High-dose statin, and continue home beta blocker Sliding scale insulin, hold metformin Home medications   If she does well, could anticipate fast-track discharge. Otherwise would consider staged PCI of the LAD if symptoms occur. If not would consider outpatient Myoview in 4-6 weeks. She can follow back up again with Dr. Bella Kennedy, M.D., M.S.     Assessment/Plan:   Principal Problem:   ST elevation myocardial infarction (STEMI) of inferior wall, initial episode of care [facility]) Active Problems:   ST elevation myocardial infarction (STEMI) of inferior wall ([place])   1. Day 2 s/p inferolateral STEMI and PCI to LCX 2. HTN 3. DM; recent diagnosis ~ 2 yrs; resume metformin tomorrow 4. HLD   5. Renal insufficiency:  Stage 3 with GFR 40 6. Anemia: probably contributed by blood loss from cath; f/u cbc 7. Obesity  No recurrent chest pain so far. To ambulate today with cardiac rehab.  With concomitant CAD will increase imdur to 30 mg today and increase BB 37.5 mg bid with plans to titrate. Check fasting lipids. Discussed weight loss and cardiac rehab post dc. Echo to be done today.   Troy Sine, MD, [facility] 08/25/2015, 9:01 AM

## 2015-08-25 NOTE — Progress Notes (Signed)
  Echocardiogram 2D Echocardiogram has been performed.  Diana Patterson 08/25/2015, 2:03 PM

## 2015-08-25 NOTE — Care Management Important Message (Signed)
Important Message  Patient Details  Name: Diana Patterson MRN: [id number] Date of Birth: 04/04/1945   Medicare Important Message Given:  Yes    Nathen May 08/25/2015, 11:52 AM

## 2015-08-26 DIAGNOSIS — I10 Essential (primary) hypertension: Secondary | ICD-10-CM

## 2015-08-26 LAB — ECHOCARDIOGRAM COMPLETE
CHL CUP DOP CALC LVOT VTI: 21.6 cm
CHL CUP STROKE VOLUME: 30 mL
E decel time: 426 msec
E/e' ratio: 8.39
FS: 34 % (ref 28–44)
IV/PV OW: 0.87
LA diam end sys: 26 cm
LA diam index: 1.2 cm/m2
LA vol A4C: 37.9 ml
LA vol index: 21.9 mL/m2
LASIZE: 26 cm
LAVOL: 47.6 cm3
LV E/e' medial: 8.39
LV E/e'average: 8.39
LV SIMPSON'S DISK: 60
LV TDI E'LATERAL: 8.27
LV dias vol: 50 mL (ref 46–106)
LV e' LATERAL: 8.27 cm/s
LVDIAVOLIN: 23 mL/m2
LVOT SV: 75 cm3
LVOT area: 3.46 cm2
LVOT diameter: 21 mm
LVOT peak vel: 88.7 cm/s
LVSYSVOL: 20 mL (ref 14–42)
LVSYSVOLIN: 9 mL/m2
MV Dec: 426
MV pk E vel: 69.4 m/s
MVPKAVEL: 110 m/s
PW: 14.2 mm — AB (ref 0.6–1.1)
RV LATERAL S' VELOCITY: 13.7 cm/s
RV TAPSE: 16.5 cm
TDI e' medial: 4.46
Weight: 3880.1 oz

## 2015-08-26 LAB — GLUCOSE, CAPILLARY
GLUCOSE-CAPILLARY: 147 mg/dL — AB (ref 65–99)
Glucose-Capillary: 121 mg/dL — ABNORMAL HIGH (ref 65–99)

## 2015-08-26 MED ORDER — ATORVASTATIN CALCIUM 80 MG PO TABS: 80.0000 mg | ORAL_TABLET | Freq: Every day | ORAL | Status: AC

## 2015-08-26 MED ORDER — TICAGRELOR 90 MG PO TABS: 90.0000 mg | ORAL_TABLET | Freq: Two times a day (BID) | ORAL | Status: DC

## 2015-08-26 MED ORDER — METOPROLOL TARTRATE 50 MG PO TABS: 50.0000 mg | ORAL_TABLET | Freq: Two times a day (BID) | ORAL | Status: DC

## 2015-08-26 MED ORDER — ISOSORBIDE DINITRATE 30 MG PO TABS: 30.0000 mg | ORAL_TABLET | Freq: Every day | ORAL | Status: DC

## 2015-08-26 MED ORDER — METOPROLOL TARTRATE 50 MG PO TABS
50.0000 mg | ORAL_TABLET | Freq: Two times a day (BID) | ORAL | Status: DC
  Administered 2015-08-26: 50 mg via ORAL
  Filled 2015-08-26: qty 1

## 2015-08-26 NOTE — Progress Notes (Signed)
Pt has been discharged home with family. CCMD was notified and telemetry box was removed. IVs were removed with no complications. Pt received discharge instructions and all questions were answered. Pt left with all of her belongings. Pt left the unit via wheelchair and was accompanied by a nurse tech and family. Pt was in no distress at time of discharge.   Grant Fontana RN, BSN

## 2015-08-26 NOTE — Discharge Summary (Signed)
Discharge Summary    Patient ID: Diana Patterson,  MRN: [id number], DOB/AGE: 71/31/1947 71 y.o.  Admit date: 08/23/2015 Discharge date: 08/26/2015  Primary Care Provider: Neale Burly Primary Cardiologist: Dr. Domenic Polite   Discharge Diagnoses    Principal Problem:   ST elevation myocardial infarction (STEMI) of inferior wall, initial episode of care [facility]) Active Problems:   ST elevation myocardial infarction (STEMI) of inferior wall (HCC)   Allergies Allergies  Allergen Reactions  . Morphine And Related Other (See Comments)    Drowsiness    Diagnostic Studies/Procedures   Emergent LHC 08/23/15 Procedures    Coronary Stent Intervention   Left Heart Cath and Coronary Angiography    Conclusion     Prox Cx to Mid Cx lesion, 90% stenosed. Post intervention aspiration thrombectomy followed by Promus Premier DES 3.5 mm x 12 mm (3.75 mm) , there is a 0% residual stenosis.  Prox LAD-2 lesion, 50-75% stenosed.  The left ventricular systolic function is normal.  Elevated LVEDP       2D Echo 08/25/15  Study Conclusions  - Left ventricle: The cavity size was normal. Wall thickness was  increased in a pattern of mild LVH. Systolic function was normal.  The estimated ejection fraction was in the range of 60% to 65%.  Wall motion was normal; there were no regional wall motion  abnormalities. Doppler parameters are consistent with abnormal  left ventricular relaxation (grade 1 diastolic dysfunction). - Mitral valve: Mildly calcified annulus.  History of Present Illness     70 y/o female, followed by Dr. Domenic Polite, with a h/o HTN, HLD, T2DM and CKD who presented to [facility] with left-sided chest pain, headache and nausea 08/23/15. She was found to have ST elevations in leads II and III. Code STEMI was activated and the patient was transferred to [facility] for emergent ivasive evaluation with cardiac catheterization.  Hospital Course       On arrival to [facility], she was stable but with ongoing CP. Troponin peaked at 6. Emergent LHC was performed by Dr. Ellyn Hack. She was found to have a proximal to mid LCx lesion, that was 90% stenosed and a 50-70% proximal LAD lesion. The culprit lesion was felt to be the thrombotic stenosis of the circumflex. She underwent successful PCI of the proximal LCx utilizing a DES. The proximal- mid LAD lesion was not felt to be flow-limiting, thus medical therapy was elected. EF was normal by cath. She tolerated the procedure well and left the cath lab in stable condition. She had no recurrent CP. She was placed on DAPT with ASA + Brilinta, high dose statin therapy and a BB. Imdur was increased to 30 mg daily. 2D echo was obtained which revealed normal LVEF of 60-65%. She had no post cath complications and no difficulties ambulating with cardiac rehab. She was last seen and examined by Dr. Claiborne Billings, who determined she was stable for discharge home. It has been recommended that she be considered for an outpatient Myoview in 4-6 weeks to assess for ischemia in the LAD territory. If abnormal NST or if recurrent CP, Dr. Ellyn Hack has recommended staged PCI of her LAD. She will have post hospital f/u with Dr. Domenic Polite in [place].   Consultants: none   Discharge Vitals Blood pressure 121/66, pulse 89, temperature 98.3 F (36.8 C), temperature source Oral, resp. rate 18, height 5\' 6"  (1.676 m), weight 242 lb (109.77 kg), SpO2 95 %.  Filed Weights   08/23/15 2133 08/25/15  [id number]  Weight: 242 lb 8.1 oz (110 kg) 242 lb (109.77 kg)    Labs & Radiologic Studies    CBC  Recent Labs  08/23/15 2221 08/24/15 0415  WBC 12.9* 11.1*  HGB 10.4* 9.6*  HCT 32.8* 29.9*  MCV 88.2 90.1  PLT 378 [id number]   Basic Metabolic Panel  Recent Labs  08/23/15 2040 08/23/15 2221 08/24/15 0415  NA 139  --  137  K 3.7  --  3.8  CL 101  --  104  CO2  --   --  23  GLUCOSE 196*  --  140*  BUN 29*  --  26*  CREATININE 1.50* 1.64* 1.49*   CALCIUM  --   --  9.5   Liver Function Tests No results for input(s): AST, ALT, ALKPHOS, BILITOT, PROT, ALBUMIN in the last 72 hours. No results for input(s): LIPASE, AMYLASE in the last 72 hours. Cardiac Enzymes  Recent Labs  08/24/15 0415 08/24/15 0945 08/24/15 1551  TROPONINI 2.38* 6.09* 5.42*   BNP Invalid input(s): POCBNP D-Dimer No results for input(s): DDIMER in the last 72 hours. Hemoglobin A1C  Recent Labs  08/23/15 2319  HGBA1C 6.5*   Fasting Lipid Panel No results for input(s): CHOL, HDL, LDLCALC, TRIG, CHOLHDL, LDLDIRECT in the last 72 hours. Thyroid Function Tests No results for input(s): TSH, T4TOTAL, T3FREE, THYROIDAB in the last 72 hours.  Invalid input(s): FREET3 _____________  No results found. Disposition   Pt is being discharged home today in good condition.  Follow-up Plans & Appointments    Follow-up Information    Follow up with Rozann Lesches, MD.   Specialty:  Cardiology   Why:  our office will call you with a follow-up appointment with Dr. Domenic Polite.    Contact information:   [place] [address] [phone]      Discharge Instructions    Amb Referral to Cardiac Rehabilitation    Complete by:  As directed   Diagnosis:   STEMI Comment - referring to [facility] CRP 2 PTCA       Diet - low sodium heart healthy    Complete by:  As directed      Increase activity slowly    Complete by:  As directed            Discharge Medications   Current Discharge Medication List    START taking these medications   Details  atorvastatin (LIPITOR) 80 MG tablet Take 1 tablet (80 mg total) by mouth daily at 6 PM. Qty: 30 tablet, Refills: 5    !! ticagrelor (BRILINTA) 90 MG TABS tablet Take 1 tablet (90 mg total) by mouth 2 (two) times daily. Qty: 60 tablet, Refills: 10    !! ticagrelor (BRILINTA) 90 MG TABS tablet Take 1 tablet (90 mg total) by mouth 2 (two) times daily. Qty: 60 tablet, Refills: 0     !! - Potential  duplicate medications found. Please discuss with provider.    CONTINUE these medications which have CHANGED   Details  isosorbide dinitrate (ISORDIL) 30 MG tablet Take 1 tablet (30 mg total) by mouth daily. Qty: 30 tablet, Refills: 5    metoprolol tartrate (LOPRESSOR) 50 MG tablet Take 1 tablet (50 mg total) by mouth 2 (two) times daily. Qty: 60 tablet, Refills: 5      CONTINUE these medications which have NOT CHANGED   Details  aspirin 81 MG tablet Take 81 mg by mouth daily.      Calcium Carb-Cholecalciferol (  CALCIUM 1000 + D) 1000-800 MG-UNIT TABS Take 1 tablet by mouth daily.     Choline Fenofibrate (TRILIPIX) 135 MG capsule Take 135 mg by mouth daily.      folic acid (FOLVITE) 1 MG tablet Take 1 mg by mouth See admin instructions. Pt takes every day except Saturday    furosemide (LASIX) 20 MG tablet Take 20 mg by mouth daily.     losartan (COZAAR) 25 MG tablet Take 25 mg by mouth daily.      metaxalone (SKELAXIN) 800 MG tablet Take 800 mg by mouth 2 (two) times daily.     metFORMIN (GLUCOPHAGE) 500 MG tablet Take 500 mg by mouth 2 (two) times daily with a meal.    Omega-3 Fatty Acids (FISH OIL) 1000 MG CAPS Take 1 capsule by mouth 2 (two) times daily.     POLYSACCH FE COMPLEX-VIT D3 PO Take 1 tablet by mouth daily.    Polysaccharide Iron Complex (POLY-IRON 150 PO) Take by mouth daily.    potassium chloride SA (K-DUR,KLOR-CON) 20 MEQ tablet Take 20 mEq by mouth daily.    methotrexate (RHEUMATREX) 2.5 MG tablet Take 20 mg by mouth once a week. 8 tabs once weekly.  Pt takes on Saturday         Aspirin prescribed at discharge?  Yes High Intensity Statin Prescribed? (Lipitor 40-80mg  or Crestor 20-40mg ): Yes Beta Blocker Prescribed? Yes For EF <40%, was ACEI/ARB Prescribed? Yes ADP Receptor Inhibitor Prescribed? (i.e. Plavix etc.-Includes Medically Managed Patients): Yes For EF <40%, Aldosterone Inhibitor Prescribed? No: EF >40% Was EF assessed during THIS  hospitalization? Yes Was Cardiac Rehab II ordered? (Included Medically managed Patients): No:    Outstanding Labs/Studies   Consider F/u NST in 4-6 weeks (see explanation above).   Duration of Discharge Encounter   Greater than 30 minutes including physician time.  Signed, Lyda Jester PA-C 08/26/2015, 2:16 PM

## 2015-08-26 NOTE — Progress Notes (Signed)
CARDIAC REHAB PHASE I   PRE:  Rate/Rhythm: 87 SR  BP:  Supine:   Sitting: 136/66  Standing:    SaO2:   MODE:  Ambulation: 510 ft   POST:  Rate/Rhythm: 106 ST  BP:  Supine:   Sitting: 127/54  Standing:    SaO2: 99%RA 0815-0840 Pt walked 510 ft with slow steady gait. Tolerated well. No CP. Does have some DOE but pt stated this is normal for her. Distance limited by hip pain from arthritis.  Sats good on RA.   Graylon Good, RN BSN  08/26/2015 8:37 AM

## 2015-08-26 NOTE — Progress Notes (Signed)
Subjective:  DAY 3 S/P inferolateral STEMI;  Objective:   Vital Signs : Filed Vitals:   08/25/15 0718 08/25/15 0841 08/25/15 1946 08/26/15 0521  BP: 117/67 128/58 103/79 108/61  Pulse: 69 82 87 80  Temp: 97.9 F (36.6 C)  98.2 F (36.8 C) 98.3 F (36.8 C)  TempSrc: Oral  Oral Oral  Resp: 18  18 18   Height:  5\' 6"  (1.676 m)    Weight:  242 lb (109.77 kg)    SpO2: 97%  98% 95%    Intake/Output from previous day: No intake or output data in the 24 hours ending 08/26/15 0848  I/O since admission: -2526  Wt Readings from Last 3 Encounters:  08/25/15 242 lb (109.77 kg)  06/06/15 243 lb (110.224 kg)  06/03/14 243 lb 12.8 oz (110.587 kg)    Medications: . aspirin EC  81 mg Oral Daily  . atorvastatin  80 mg Oral q1800  . calcium-vitamin D  1 tablet Oral Daily  . fenofibrate  160 mg Oral Daily  . furosemide  20 mg Oral Daily  . heparin  5,000 Units Subcutaneous Q8H  . insulin aspart  0-15 Units Subcutaneous TID WC  . isosorbide dinitrate  30 mg Oral BID  . losartan  25 mg Oral Daily  . metaxalone  800 mg Oral BID  . metoprolol tartrate  37.5 mg Oral BID  . potassium chloride SA  20 mEq Oral Daily  . sodium chloride flush  3 mL Intravenous Q12H  . ticagrelor  90 mg Oral BID       Physical Exam:   General appearance: alert, cooperative and no distress Neck: no adenopathy, no carotid bruit, no JVD, supple, symmetrical, trachea midline and thyroid not enlarged, symmetric, no tenderness/mass/nodules Lungs: clear to auscultation bilaterally Heart: regular rate and rhythm and 1/6 systolic murmur; no diatolic murmur Abdomen: moderate central adipositysoft, non-tender; bowel sounds normal; no masses,  no organomegaly Extremities: no edema, redness or tenderness in the calves or thighs Pulses: 2+ and symmetric; R radial cath site stable Skin: Skin color, texture, turgor normal. No rashes or lesions Neurologic: Grossly normal   Rate: 74  Rhythm: normal sinus  rhythm  08/24/2015 ECG (independently read by me): NSR at 76 ; Q in III; QTc 487 msec  Lab Results:   Recent Labs  08/23/15 2040 08/23/15 2221 08/24/15 0415  NA 139  --  137  K 3.7  --  3.8  CL 101  --  104  CO2  --   --  23  GLUCOSE 196*  --  140*  BUN 29*  --  26*  CREATININE 1.50* 1.64* 1.49*  CALCIUM  --   --  9.5    No flowsheet data found.   Recent Labs  08/23/15 2040 08/23/15 2221 08/24/15 0415  WBC  --  12.9* 11.1*  HGB 11.9* 10.4* 9.6*  HCT 35.0* 32.8* 29.9*  MCV  --  88.2 90.1  PLT  --  378 346     Recent Labs  08/24/15 0415 08/24/15 0945 08/24/15 1551  TROPONINI 2.38* 6.09* 5.42*    No results found for: TSH  Recent Labs  08/23/15 2319  HGBA1C 6.5*    No results for input(s): PROT, ALBUMIN, AST, ALT, ALKPHOS, BILITOT, BILIDIR, IBILI in the last 72 hours. No results for input(s): INR in the last 72 hours. BNP (last 3 results) No results for input(s): BNP in the last 8760 hours.  ProBNP (last 3 results) No results for  input(s): PROBNP in the last 8760 hours.   Lipid Panel  No results found for: CHOL, TRIG, HDL, CHOLHDL, VLDL, LDLCALC, LDLDIRECT    Imaging:  No results found.  EMERGENT CARDIAC CATH: 08/23/2015 Conclusion     Prox Cx to Mid Cx lesion, 90% stenosed. Post intervention aspiration thrombectomy followed by Promus Premier DES 3.5 mm x 12 mm (3.75 mm) , there is a 0% residual stenosis.  Prox LAD-2 lesion, 50-75% stenosed.  The left ventricular systolic function is normal.  Elevated LVEDP   Likely culprit lesion was the thrombotic stenosis of the circumflex. It would appear that there was reperfusion after heparin and aspirin was given in the emergency room. This would be consistent with her improved chest pain upon arrival.  Successful PCI of the proximal dominant Circumflex. There is residual disease in the proximal-mid LAD prior to D1. This does not appear to be flow-limiting, however would probably benefit from  being investigated. I would determine course of action based on her symptoms. If she is a symptomatically, could consider discharge with plans for outpatient Myoview to basket the LAD, otherwise if she has symptoms of angina following this PCI, I would proceed with staged PCI of the LAD prior to her discharge.  Plan: Admit to ICU. Aggrastat for 12 hours Aspirin plus Brilinta High-dose statin, and continue home beta blocker Sliding scale insulin, hold metformin Home medications   If she does well, could anticipate fast-track discharge. Otherwise would consider staged PCI of the LAD if symptoms occur. If not would consider outpatient Myoview in 4-6 weeks. She can follow back up again with Dr. Bella Kennedy, M.D., M.S.     Assessment/Plan:   Principal Problem:   ST elevation myocardial infarction (STEMI) of inferior wall, initial episode of care [facility]) Active Problems:   ST elevation myocardial infarction (STEMI) of inferior wall ([place])   1. Day 3 s/p inferolateral STEMI and PCI to LCX 2. HTN 3. DM; recent diagnosis ~ 2 yrs; resume metformin tomorrow 4. HLD   5. Renal insufficiency:  Stage 3 with GFR 40 6. Anemia: probably contributed by blood loss from cath; f/u cbc 7. Obesity  No recurrent chest pain so far with ambulation and cardiac rehab. Resting pulse in the 80's.  With concomitant CAD will increase imdur to 60 mg daily and increase BB 50 mg mg bid. Echo results still pending.  Will plan for probable dc this afternoon and f/u with Dr. Domenic Polite in [place].  Troy Sine, MD, [facility]   08/26/2015, 8:48 AM

## 2015-08-27 ENCOUNTER — Telehealth: Payer: Self-pay | Admitting: Cardiology

## 2015-08-27 NOTE — Telephone Encounter (Signed)
Closed encounter °

## 2015-09-10 ENCOUNTER — Encounter: Payer: Self-pay | Admitting: Cardiology

## 2015-09-10 ENCOUNTER — Ambulatory Visit (INDEPENDENT_AMBULATORY_CARE_PROVIDER_SITE_OTHER): Payer: Medicare Other | Admitting: Cardiology

## 2015-09-10 VITALS — BP 100/58 | HR 71 | Ht 66.0 in | Wt 237.0 lb

## 2015-09-10 DIAGNOSIS — I25119 Atherosclerotic heart disease of native coronary artery with unspecified angina pectoris: Secondary | ICD-10-CM | POA: Diagnosis not present

## 2015-09-10 DIAGNOSIS — E785 Hyperlipidemia, unspecified: Secondary | ICD-10-CM | POA: Diagnosis not present

## 2015-09-10 DIAGNOSIS — I2119 ST elevation (STEMI) myocardial infarction involving other coronary artery of inferior wall: Secondary | ICD-10-CM

## 2015-09-10 DIAGNOSIS — N183 Chronic kidney disease, stage 3 (moderate): Secondary | ICD-10-CM

## 2015-09-10 DIAGNOSIS — I10 Essential (primary) hypertension: Secondary | ICD-10-CM | POA: Diagnosis not present

## 2015-09-10 NOTE — Patient Instructions (Signed)
Medication Instructions:  Continue all current medications.  Labwork: NONE  Testing/Procedures: NONE  Referrals:  Cardiac Rehab in [facility] - written script given today.   Follow-Up: 6 weeks    If you need a refill on your cardiac medications before your next appointment, please call your pharmacy.

## 2015-09-10 NOTE — Progress Notes (Signed)
Cardiology Office Note  Date: 09/10/2015   ID: Diana Patterson, DOB 1945/05/30, MRN [id number]  PCP: Neale Burly, MD  Primary Cardiologist: Rozann Lesches, MD   Chief Complaint  Patient presents with  . Hospitalization Follow-up  . Coronary Artery Disease    History of Present Illness: Diana Patterson is a 70 y.o. female I last saw in March of this year, clinically stable at that time on medical therapy for ischemic heart disease. Reviewed interval records. She presented in June to [facility] with evidence of inferior STEMI and was transferred to [facility] for further evaluation. Cardiac catheterization was performed by Dr. Ellyn Hack with findings of a 90% proximal to mid circumflex stenosis that was treated with DES. She also had 50-70% LAD stenosis that was managed medically. LVEF found to be normal by echocardiogram. Medical therapy was adjusted including treatment with DAPT, aspirin and Brilinta. Patient was discharged with recommendation to consider a Myoview study in 4-6 weeks for assessment of the LAD territory.  She presents today with her husband for a follow-up visit. Does not report any angina symptoms. We discussed her medications, she reports compliance with DAPT, no bleeding problems. She is interested in starting cardiac rehabilitation in [place]. We gave her a hand carried prescription with a referral request pending receipt of documentation from [facility].  I reviewed her ECG today which shows sinus rhythm with low voltage and poor anterior R wave progression.  He states that she is seeing Dr. Sherrie Sport for a follow-up visit tomorrow.  Past Medical History  Diagnosis Date  . NSTEMI (non-ST elevated myocardial infarction) ([place])     Probable type II event, negative Cardiolite 2012  . Essential hypertension, benign   . Mixed hyperlipidemia   . Type 2 diabetes mellitus ([place])   . TIA (transient ischemic attack)   . CKD (chronic kidney  disease) stage 3, GFR 30-59 ml/min     Dr. Lowanda Foster  . CAD (coronary artery disease)     DES to circumflex June 2017, moderate residual LAD disease    Past Surgical History  Procedure Laterality Date  . Tubal pregnancy removal    . Cardiac catheterization N/A 08/23/2015    Procedure: Left Heart Cath and Coronary Angiography;  Surgeon: Leonie Man, MD;  Location: [place] [facility];  Service: Cardiovascular;  Laterality: N/A;  . Cardiac catheterization N/A 08/23/2015    Procedure: Coronary Stent Intervention;  Surgeon: Leonie Man, MD;  Location: [place] [facility];  Service: Cardiovascular;  Laterality: N/A;    Current Outpatient Prescriptions  Medication Sig Dispense Refill  . aspirin 81 MG tablet Take 81 mg by mouth daily.      [name] atorvastatin (LIPITOR) 80 MG tablet Take 1 tablet (80 mg total) by mouth daily at 6 PM. 30 tablet 5  . Calcium Carb-Cholecalciferol (CALCIUM 1000 + D) 1000-800 MG-UNIT TABS Take 1 tablet by mouth daily.     . Choline Fenofibrate (TRILIPIX) 135 MG capsule Take 135 mg by mouth daily.      . folic acid (FOLVITE) 1 MG tablet Take 1 mg by mouth See admin instructions. Pt takes every day except Saturday    . furosemide (LASIX) 20 MG tablet Take 20 mg by mouth daily.     . isosorbide dinitrate (ISORDIL) 30 MG tablet Take 1 tablet (30 mg total) by mouth daily. 30 tablet 5  . losartan (COZAAR) 25 MG tablet Take 25 mg by mouth daily.      [name]  metaxalone (SKELAXIN) 800 MG tablet Take 800 mg by mouth 2 (two) times daily.     . metFORMIN (GLUCOPHAGE) 500 MG tablet Take 500 mg by mouth 2 (two) times daily with a meal.    . methotrexate (RHEUMATREX) 2.5 MG tablet Take 20 mg by mouth once a week. 8 tabs once weekly.  Pt takes on Saturday    . metoprolol tartrate (LOPRESSOR) 50 MG tablet Take 1 tablet (50 mg total) by mouth 2 (two) times daily. 60 tablet 5  . Omega-3 Fatty Acids (FISH OIL) 1000 MG CAPS Take 1 capsule by mouth 2 (two) times daily.     [name] POLYSACCH FE  COMPLEX-VIT D3 PO Take 1 tablet by mouth daily.    . Polysaccharide Iron Complex (POLY-IRON 150 PO) Take by mouth daily.    . potassium chloride SA (K-DUR,KLOR-CON) 20 MEQ tablet Take 20 mEq by mouth daily.    . ticagrelor (BRILINTA) 90 MG TABS tablet Take 1 tablet (90 mg total) by mouth 2 (two) times daily. 60 tablet 0   No current facility-administered medications for this visit.   Allergies:  Morphine and related   Social History: The patient  reports that she quit smoking about 37 years ago. Her smoking use included Cigarettes. She has a 20 pack-year smoking history. She has never used smokeless tobacco. She reports that she does not drink alcohol or use illicit drugs.  ROS:  Please see the history of present illness. Otherwise, complete review of systems is positive for none.  All other systems are reviewed and negative.   Physical Exam: VS:  BP 100/58 mmHg  Pulse 71  Ht 5\' 6"  (1.676 m)  Wt 237 lb (107.502 kg)  BMI 38.27 kg/m2  SpO2 97%, BMI Body mass index is 38.27 kg/(m^2).  Wt Readings from Last 3 Encounters:  09/10/15 237 lb (107.502 kg)  08/25/15 242 lb (109.77 kg)  06/06/15 243 lb (110.224 kg)    General: Patient appears comfortable at rest. HEENT: Conjunctiva and lids normal, oropharynx clear.  Neck: Supple, no elevated JVP or carotid bruits, no thyromegaly.  Lungs: Clear to auscultation, nonlabored breathing at rest.  Cardiac: Regular rate and rhythm, no S3 or significant systolic murmur, no pericardial rub.  Abdomen: Soft, nontender,bowel sounds present, no guarding or rebound.  Extremities: No pitting edema, distal pulses 1-2+. Right radial arteriotomy site healing well. Skin: Warm and dry. Musculoskeletal: No kyphosis. Neuropsychiatric: Alert and oriented 3, affect appropriate.  ECG: I personally reviewed the tracing from 08/24/2015 which showed normal sinus rhythm.  Recent Labwork: 08/24/2015: BUN 26*; Creatinine, Ser 1.49*; Hemoglobin 9.6*; Platelets 346;  Potassium 3.8; Sodium 137   Other Studies Reviewed Today:  Cardiac catheterization 08/23/2015:  Prox Cx to Mid Cx lesion, 90% stenosed. Post intervention aspiration thrombectomy followed by Promus Premier DES 3.5 mm x 12 mm (3.75 mm) , there is a 0% residual stenosis.  Prox LAD-2 lesion, 50-75% stenosed.  The left ventricular systolic function is normal.  Elevated LVEDP  Likely culprit lesion was the thrombotic stenosis of the circumflex. It would appear that there was reperfusion after heparin and aspirin was given in the emergency room. This would be consistent with her improved chest pain upon arrival.  Successful PCI of the proximal dominant Circumflex. There is residual disease in the proximal-mid LAD prior to D1. This does not appear to be flow-limiting, however would probably benefit from being investigated. I would determine course of action based on her symptoms. If she is a symptomatically, could consider  discharge with plans for outpatient Myoview to basket the LAD, otherwise if she has symptoms of angina following this PCI, I would proceed with staged PCI of the LAD prior to her discharge.  Echocardiogram 08/25/2015: Study Conclusions  - Left ventricle: The cavity size was normal. Wall thickness was  increased in a pattern of mild LVH. Systolic function was normal.  The estimated ejection fraction was in the range of 60% to 65%.  Wall motion was normal; there were no regional wall motion  abnormalities. Doppler parameters are consistent with abnormal  left ventricular relaxation (grade 1 diastolic dysfunction). - Mitral valve: Mildly calcified annulus.  Assessment and Plan:  1. Status post recent inferior STEMI with DES intervention to the circumflex as outlined above and documentation of moderate LAD disease. She is asymptomatic on current medical therapy. Discussed the importance of DAPT. Also made referral to [facility] cardiac rehabilitation program. We will have her  return over the next 6 weeks in anticipation of an outpatient Cardiolite to assess for any potential anterior wall ischemia.  2. Essential hypertension, blood pressure is well controlled today.  3. Hyperlipidemia, on Lipitor.  4. History of CKD stage 2-3, recent creatinine 1.5.  Current medicines were reviewed with the patient today.   Orders Placed This Encounter  Procedures  . EKG 12-Lead    Disposition: Follow-up with me in 6 weeks.  Signed, Satira Sark, MD, [facility] 09/10/2015 11:23 AM    [facility] at [place], [place], [place] [address] Phone: [phone]; Fax: [phone]

## 2015-09-11 DIAGNOSIS — G4733 Obstructive sleep apnea (adult) (pediatric): Secondary | ICD-10-CM | POA: Diagnosis not present

## 2015-09-11 DIAGNOSIS — I2109 ST elevation (STEMI) myocardial infarction involving other coronary artery of anterior wall: Secondary | ICD-10-CM | POA: Diagnosis not present

## 2015-09-11 DIAGNOSIS — I10 Essential (primary) hypertension: Secondary | ICD-10-CM | POA: Diagnosis not present

## 2015-09-11 DIAGNOSIS — E11610 Type 2 diabetes mellitus with diabetic neuropathic arthropathy: Secondary | ICD-10-CM | POA: Diagnosis not present

## 2015-09-11 DIAGNOSIS — M0589 Other rheumatoid arthritis with rheumatoid factor of multiple sites: Secondary | ICD-10-CM | POA: Diagnosis not present

## 2015-09-11 DIAGNOSIS — E119 Type 2 diabetes mellitus without complications: Secondary | ICD-10-CM | POA: Diagnosis not present

## 2015-09-16 DIAGNOSIS — I213 ST elevation (STEMI) myocardial infarction of unspecified site: Secondary | ICD-10-CM | POA: Diagnosis not present

## 2015-09-16 DIAGNOSIS — I2510 Atherosclerotic heart disease of native coronary artery without angina pectoris: Secondary | ICD-10-CM | POA: Diagnosis not present

## 2015-09-16 DIAGNOSIS — Z955 Presence of coronary angioplasty implant and graft: Secondary | ICD-10-CM | POA: Diagnosis not present

## 2015-09-18 DIAGNOSIS — Z955 Presence of coronary angioplasty implant and graft: Secondary | ICD-10-CM | POA: Diagnosis not present

## 2015-09-18 DIAGNOSIS — I213 ST elevation (STEMI) myocardial infarction of unspecified site: Secondary | ICD-10-CM | POA: Diagnosis not present

## 2015-09-18 DIAGNOSIS — I2510 Atherosclerotic heart disease of native coronary artery without angina pectoris: Secondary | ICD-10-CM | POA: Diagnosis not present

## 2015-09-25 DIAGNOSIS — I213 ST elevation (STEMI) myocardial infarction of unspecified site: Secondary | ICD-10-CM | POA: Diagnosis not present

## 2015-09-25 DIAGNOSIS — I2510 Atherosclerotic heart disease of native coronary artery without angina pectoris: Secondary | ICD-10-CM | POA: Diagnosis not present

## 2015-09-25 DIAGNOSIS — Z955 Presence of coronary angioplasty implant and graft: Secondary | ICD-10-CM | POA: Diagnosis not present

## 2015-09-30 DIAGNOSIS — I2510 Atherosclerotic heart disease of native coronary artery without angina pectoris: Secondary | ICD-10-CM | POA: Diagnosis not present

## 2015-09-30 DIAGNOSIS — I213 ST elevation (STEMI) myocardial infarction of unspecified site: Secondary | ICD-10-CM | POA: Diagnosis not present

## 2015-09-30 DIAGNOSIS — Z955 Presence of coronary angioplasty implant and graft: Secondary | ICD-10-CM | POA: Diagnosis not present

## 2015-10-02 DIAGNOSIS — I213 ST elevation (STEMI) myocardial infarction of unspecified site: Secondary | ICD-10-CM | POA: Diagnosis not present

## 2015-10-02 DIAGNOSIS — I2510 Atherosclerotic heart disease of native coronary artery without angina pectoris: Secondary | ICD-10-CM | POA: Diagnosis not present

## 2015-10-02 DIAGNOSIS — Z955 Presence of coronary angioplasty implant and graft: Secondary | ICD-10-CM | POA: Diagnosis not present

## 2015-10-07 DIAGNOSIS — I2510 Atherosclerotic heart disease of native coronary artery without angina pectoris: Secondary | ICD-10-CM | POA: Diagnosis not present

## 2015-10-07 DIAGNOSIS — Z955 Presence of coronary angioplasty implant and graft: Secondary | ICD-10-CM | POA: Diagnosis not present

## 2015-10-07 DIAGNOSIS — I213 ST elevation (STEMI) myocardial infarction of unspecified site: Secondary | ICD-10-CM | POA: Diagnosis not present

## 2015-10-08 DIAGNOSIS — I2510 Atherosclerotic heart disease of native coronary artery without angina pectoris: Secondary | ICD-10-CM | POA: Diagnosis not present

## 2015-10-08 DIAGNOSIS — I213 ST elevation (STEMI) myocardial infarction of unspecified site: Secondary | ICD-10-CM | POA: Diagnosis not present

## 2015-10-08 DIAGNOSIS — Z955 Presence of coronary angioplasty implant and graft: Secondary | ICD-10-CM | POA: Diagnosis not present

## 2015-10-10 DIAGNOSIS — I2510 Atherosclerotic heart disease of native coronary artery without angina pectoris: Secondary | ICD-10-CM | POA: Diagnosis not present

## 2015-10-10 DIAGNOSIS — Z955 Presence of coronary angioplasty implant and graft: Secondary | ICD-10-CM | POA: Diagnosis not present

## 2015-10-10 DIAGNOSIS — I213 ST elevation (STEMI) myocardial infarction of unspecified site: Secondary | ICD-10-CM | POA: Diagnosis not present

## 2015-10-13 DIAGNOSIS — I213 ST elevation (STEMI) myocardial infarction of unspecified site: Secondary | ICD-10-CM | POA: Diagnosis not present

## 2015-10-13 DIAGNOSIS — I2510 Atherosclerotic heart disease of native coronary artery without angina pectoris: Secondary | ICD-10-CM | POA: Diagnosis not present

## 2015-10-13 DIAGNOSIS — Z955 Presence of coronary angioplasty implant and graft: Secondary | ICD-10-CM | POA: Diagnosis not present

## 2015-10-15 DIAGNOSIS — I2510 Atherosclerotic heart disease of native coronary artery without angina pectoris: Secondary | ICD-10-CM | POA: Diagnosis not present

## 2015-10-15 DIAGNOSIS — Z955 Presence of coronary angioplasty implant and graft: Secondary | ICD-10-CM | POA: Diagnosis not present

## 2015-10-15 DIAGNOSIS — I213 ST elevation (STEMI) myocardial infarction of unspecified site: Secondary | ICD-10-CM | POA: Diagnosis not present

## 2015-10-17 DIAGNOSIS — I213 ST elevation (STEMI) myocardial infarction of unspecified site: Secondary | ICD-10-CM | POA: Diagnosis not present

## 2015-10-17 DIAGNOSIS — I2510 Atherosclerotic heart disease of native coronary artery without angina pectoris: Secondary | ICD-10-CM | POA: Diagnosis not present

## 2015-10-17 DIAGNOSIS — Z955 Presence of coronary angioplasty implant and graft: Secondary | ICD-10-CM | POA: Diagnosis not present

## 2015-10-20 DIAGNOSIS — I213 ST elevation (STEMI) myocardial infarction of unspecified site: Secondary | ICD-10-CM | POA: Diagnosis not present

## 2015-10-20 DIAGNOSIS — Z955 Presence of coronary angioplasty implant and graft: Secondary | ICD-10-CM | POA: Diagnosis not present

## 2015-10-20 DIAGNOSIS — I2510 Atherosclerotic heart disease of native coronary artery without angina pectoris: Secondary | ICD-10-CM | POA: Diagnosis not present

## 2015-10-22 DIAGNOSIS — I2510 Atherosclerotic heart disease of native coronary artery without angina pectoris: Secondary | ICD-10-CM | POA: Diagnosis not present

## 2015-10-22 DIAGNOSIS — I252 Old myocardial infarction: Secondary | ICD-10-CM | POA: Diagnosis not present

## 2015-10-22 DIAGNOSIS — Z955 Presence of coronary angioplasty implant and graft: Secondary | ICD-10-CM | POA: Diagnosis not present

## 2015-10-23 DIAGNOSIS — E119 Type 2 diabetes mellitus without complications: Secondary | ICD-10-CM | POA: Diagnosis not present

## 2015-10-24 DIAGNOSIS — Z955 Presence of coronary angioplasty implant and graft: Secondary | ICD-10-CM | POA: Diagnosis not present

## 2015-10-24 DIAGNOSIS — I252 Old myocardial infarction: Secondary | ICD-10-CM | POA: Diagnosis not present

## 2015-10-24 DIAGNOSIS — I2510 Atherosclerotic heart disease of native coronary artery without angina pectoris: Secondary | ICD-10-CM | POA: Diagnosis not present

## 2015-10-27 DIAGNOSIS — Z955 Presence of coronary angioplasty implant and graft: Secondary | ICD-10-CM | POA: Diagnosis not present

## 2015-10-27 DIAGNOSIS — I252 Old myocardial infarction: Secondary | ICD-10-CM | POA: Diagnosis not present

## 2015-10-27 DIAGNOSIS — I2510 Atherosclerotic heart disease of native coronary artery without angina pectoris: Secondary | ICD-10-CM | POA: Diagnosis not present

## 2015-10-28 DIAGNOSIS — N183 Chronic kidney disease, stage 3 (moderate): Secondary | ICD-10-CM | POA: Diagnosis not present

## 2015-10-28 DIAGNOSIS — E559 Vitamin D deficiency, unspecified: Secondary | ICD-10-CM | POA: Diagnosis not present

## 2015-10-28 DIAGNOSIS — Z79899 Other long term (current) drug therapy: Secondary | ICD-10-CM | POA: Diagnosis not present

## 2015-10-28 DIAGNOSIS — I129 Hypertensive chronic kidney disease with stage 1 through stage 4 chronic kidney disease, or unspecified chronic kidney disease: Secondary | ICD-10-CM | POA: Diagnosis not present

## 2015-10-28 DIAGNOSIS — R809 Proteinuria, unspecified: Secondary | ICD-10-CM | POA: Diagnosis not present

## 2015-10-28 DIAGNOSIS — D509 Iron deficiency anemia, unspecified: Secondary | ICD-10-CM | POA: Diagnosis not present

## 2015-10-29 DIAGNOSIS — I252 Old myocardial infarction: Secondary | ICD-10-CM | POA: Diagnosis not present

## 2015-10-29 DIAGNOSIS — E669 Obesity, unspecified: Secondary | ICD-10-CM | POA: Diagnosis not present

## 2015-10-29 DIAGNOSIS — N183 Chronic kidney disease, stage 3 (moderate): Secondary | ICD-10-CM | POA: Diagnosis not present

## 2015-10-29 DIAGNOSIS — I2510 Atherosclerotic heart disease of native coronary artery without angina pectoris: Secondary | ICD-10-CM | POA: Diagnosis not present

## 2015-10-29 DIAGNOSIS — Z955 Presence of coronary angioplasty implant and graft: Secondary | ICD-10-CM | POA: Diagnosis not present

## 2015-10-29 DIAGNOSIS — I10 Essential (primary) hypertension: Secondary | ICD-10-CM | POA: Diagnosis not present

## 2015-10-29 DIAGNOSIS — D638 Anemia in other chronic diseases classified elsewhere: Secondary | ICD-10-CM | POA: Diagnosis not present

## 2015-10-31 DIAGNOSIS — I252 Old myocardial infarction: Secondary | ICD-10-CM | POA: Diagnosis not present

## 2015-10-31 DIAGNOSIS — Z955 Presence of coronary angioplasty implant and graft: Secondary | ICD-10-CM | POA: Diagnosis not present

## 2015-10-31 DIAGNOSIS — I2510 Atherosclerotic heart disease of native coronary artery without angina pectoris: Secondary | ICD-10-CM | POA: Diagnosis not present

## 2015-11-03 DIAGNOSIS — Z955 Presence of coronary angioplasty implant and graft: Secondary | ICD-10-CM | POA: Diagnosis not present

## 2015-11-03 DIAGNOSIS — I2510 Atherosclerotic heart disease of native coronary artery without angina pectoris: Secondary | ICD-10-CM | POA: Diagnosis not present

## 2015-11-03 DIAGNOSIS — I252 Old myocardial infarction: Secondary | ICD-10-CM | POA: Diagnosis not present

## 2015-11-04 ENCOUNTER — Encounter: Payer: Self-pay | Admitting: *Deleted

## 2015-11-05 ENCOUNTER — Ambulatory Visit (INDEPENDENT_AMBULATORY_CARE_PROVIDER_SITE_OTHER): Payer: Medicare Other | Admitting: Cardiology

## 2015-11-05 ENCOUNTER — Encounter: Payer: Self-pay | Admitting: Cardiology

## 2015-11-05 ENCOUNTER — Encounter: Payer: Self-pay | Admitting: *Deleted

## 2015-11-05 VITALS — BP 102/63 | HR 81 | Ht 66.0 in | Wt 229.8 lb

## 2015-11-05 DIAGNOSIS — N183 Chronic kidney disease, stage 3 (moderate): Secondary | ICD-10-CM | POA: Diagnosis not present

## 2015-11-05 DIAGNOSIS — I2510 Atherosclerotic heart disease of native coronary artery without angina pectoris: Secondary | ICD-10-CM

## 2015-11-05 DIAGNOSIS — I10 Essential (primary) hypertension: Secondary | ICD-10-CM | POA: Diagnosis not present

## 2015-11-05 DIAGNOSIS — I2119 ST elevation (STEMI) myocardial infarction involving other coronary artery of inferior wall: Secondary | ICD-10-CM

## 2015-11-05 DIAGNOSIS — E785 Hyperlipidemia, unspecified: Secondary | ICD-10-CM

## 2015-11-05 DIAGNOSIS — I252 Old myocardial infarction: Secondary | ICD-10-CM | POA: Diagnosis not present

## 2015-11-05 DIAGNOSIS — Z955 Presence of coronary angioplasty implant and graft: Secondary | ICD-10-CM | POA: Diagnosis not present

## 2015-11-05 NOTE — Patient Instructions (Signed)
Your physician recommends that you schedule a follow-up appointment in: [place]  Your physician recommends that you continue on your current medications as directed. Please refer to the Current Medication list given to you today.  Your physician has requested that you have a lexiscan myoview. For further information please visit [email or web address]. Please follow instruction sheet, as given.  Thank you for choosing [facility]!!

## 2015-11-05 NOTE — Progress Notes (Signed)
Cardiology Office Note  Date: 11/05/2015   ID: Diana Patterson, DOB [birth year], MRN [id number]  PCP: Diana Burly, MD  Primary Cardiologist: Diana Lesches, MD   Chief Complaint  Patient presents with  . Coronary Artery Disease    History of Present Illness: Diana Patterson is a 70 y.o. female last seen in June.She presents for a routine follow-up visit. Doing well at this time without angina. She will be graduating from cardiac rehabilitation this Friday and plans to do exercises on her own at a local gym thereafter.  We discussed arranging a follow-up Myoview to assess for any potential residual anterior wall ischemia based on her most recent cardiac catheterization findings. She had residual 50-70% LAD stenosis at the time of her cardiac catheterization with inferior STEMI and placement of DES to the circumflex back in June.  I reviewed her medications. No changes in cardiac regimen. She does not report any bleeding problems on DAPT.  Past Medical History:  Diagnosis Date  . CAD (coronary artery disease)    DES to circumflex June 2017, moderate residual LAD disease  . CKD (chronic kidney disease) stage 3, GFR 30-59 ml/min    Dr. Lowanda Foster  . Essential hypertension, benign   . Mixed hyperlipidemia   . NSTEMI (non-ST elevated myocardial infarction) ([place])    Probable type II event, negative Cardiolite 2012  . TIA (transient ischemic attack)   . Type 2 diabetes mellitus ([place])     Past Surgical History:  Procedure Laterality Date  . CARDIAC CATHETERIZATION N/A 08/23/2015   Procedure: Left Heart Cath and Coronary Angiography;  Surgeon: Leonie Man, MD;  Location: [place] [facility];  Service: Cardiovascular;  Laterality: N/A;  . CARDIAC CATHETERIZATION N/A 08/23/2015   Procedure: Coronary Stent Intervention;  Surgeon: Leonie Man, MD;  Location: [place] [facility];  Service: Cardiovascular;  Laterality: N/A;  . Tubal pregnancy removal      Current  Outpatient Prescriptions  Medication Sig Dispense Refill  . aspirin 81 MG tablet Take 81 mg by mouth daily.      [name] atorvastatin (LIPITOR) 80 MG tablet Take 1 tablet (80 mg total) by mouth daily at 6 PM. 30 tablet 5  . Calcium Carb-Cholecalciferol (CALCIUM 1000 + D) 1000-800 MG-UNIT TABS Take 1 tablet by mouth daily.     . Choline Fenofibrate (TRILIPIX) 135 MG capsule Take 135 mg by mouth daily.      . folic acid (FOLVITE) 1 MG tablet Take 1 mg by mouth See admin instructions. Pt takes every day except Saturday    . furosemide (LASIX) 20 MG tablet Take 20 mg by mouth daily.     . isosorbide dinitrate (ISORDIL) 30 MG tablet Take 1 tablet (30 mg total) by mouth daily. 30 tablet 5  . losartan (COZAAR) 25 MG tablet Take 25 mg by mouth daily.      . metaxalone (SKELAXIN) 800 MG tablet Take 800 mg by mouth 2 (two) times daily.     . metFORMIN (GLUCOPHAGE) 500 MG tablet Take 500 mg by mouth 2 (two) times daily with a meal.    . methotrexate (RHEUMATREX) 2.5 MG tablet Take 20 mg by mouth once a week. 8 tabs once weekly.  Pt takes on Saturday    . metoprolol tartrate (LOPRESSOR) 50 MG tablet Take 1 tablet (50 mg total) by mouth 2 (two) times daily. 60 tablet 5  . Omega-3 Fatty Acids (FISH OIL) 1000 MG CAPS Take 1 capsule by mouth  2 (two) times daily.     [name] POLYSACCH FE COMPLEX-VIT D3 PO Take 1 tablet by mouth daily.    . Polysaccharide Iron Complex (POLY-IRON 150 PO) Take by mouth daily.    . potassium chloride SA (K-DUR,KLOR-CON) 20 MEQ tablet Take 20 mEq by mouth daily.    . ticagrelor (BRILINTA) 90 MG TABS tablet Take 1 tablet (90 mg total) by mouth 2 (two) times daily. 60 tablet 0   No current facility-administered medications for this visit.    Allergies:  Morphine and related   Social History: The patient  reports that she quit smoking about 37 years ago. Her smoking use included Cigarettes. She has a 20.00 pack-year smoking history. She has never used smokeless tobacco. She reports that she does  not drink alcohol or use drugs.   ROS:  Please see the history of present illness. Otherwise, complete review of systems is positive for none.  All other systems are reviewed and negative.   Physical Exam: VS:  BP 102/63   Pulse 81   Ht 5\' 6"  (1.676 m)   Wt 229 lb 12.8 oz (104.2 kg)   SpO2 97%   BMI 37.09 kg/m , BMI Body mass index is 37.09 kg/m.  Wt Readings from Last 3 Encounters:  11/05/15 229 lb 12.8 oz (104.2 kg)  09/10/15 237 lb (107.5 kg)  08/25/15 242 lb (109.8 kg)    General: Patient appears comfortable at rest. HEENT: Conjunctiva and lids normal, oropharynx clear.  Neck: Supple, no elevated JVP or carotid bruits, no thyromegaly.  Lungs: Clear to auscultation, nonlabored breathing at rest.  Cardiac: Regular rate and rhythm, no S3 or significant systolic murmur, no pericardial rub.  Abdomen: Soft, nontender,bowel sounds present, no guarding or rebound.  Extremities: No pitting edema, distal pulses 1-2+. Right radial arteriotomy site healing well. Skin: Warm and dry. Musculoskeletal: No kyphosis. Neuropsychiatric: Alert and oriented 3, affect appropriate.  ECG: I personally reviewed the tracing from 09/10/2015 which showed normal sinus rhythm with low voltage.  Recent Labwork: 08/24/2015: BUN 26; Creatinine, Ser 1.49; Hemoglobin 9.6; Platelets 346; Potassium 3.8; Sodium 137   Other Studies Reviewed Today:  Cardiac catheterization 08/23/2015:  Prox Cx to Mid Cx lesion, 90% stenosed. Post intervention aspiration thrombectomy followed by Promus Premier DES 3.5 mm x 12 mm (3.75 mm) , there is a 0% residual stenosis.  Prox LAD-2 lesion, 50-75% stenosed.  The left ventricular systolic function is normal.  Elevated LVEDP  Likely culprit lesion was the thrombotic stenosis of the circumflex. It would appear that there was reperfusion after heparin and aspirin was given in the emergency room. This would be consistent with her improved chest pain upon  arrival.  Successful PCI of the proximal dominant Circumflex. There is residual disease in the proximal-mid LAD prior to D1. This does not appear to be flow-limiting, however would probably benefit from being investigated. I would determine course of action based on her symptoms. If she is a symptomatically, could consider discharge with plans for outpatient Myoview to basket the LAD, otherwise if she has symptoms of angina following this PCI, I would proceed with staged PCI of the LAD prior to her discharge.  Echocardiogram 08/25/2015: Study Conclusions  - Left ventricle: The cavity size was normal. Wall thickness was  increased in a pattern of mild LVH. Systolic function was normal.  The estimated ejection fraction was in the range of 60% to 65%.  Wall motion was normal; there were no regional wall motion  abnormalities. Doppler  parameters are consistent with abnormal  left ventricular relaxation (grade 1 diastolic dysfunction). - Mitral valve: Mildly calcified annulus.  Assessment and Plan:  1. Symptomatically stable CAD. She completes cardiac rehabilitation on Friday. No angina at this time with most recent DES intervention to the circumflex in the setting of inferior STEMI in June. She did have moderate LAD disease with recommendation to pursue follow-up noninvasive ischemic imaging around this time. We will schedule a Lexiscan Myoview on medical therapy for evaluation.  2. Hyperlipidemia, continues on Lipitor. Consider follow-up lipid panel around the time of her next visit.  3. Essential hypertension, blood pressure is well controlled today.  4. CKD stage 2-3.  Current medicines were reviewed with the patient today.   Orders Placed This Encounter  Procedures  . NM Myocar Multi W/Spect W/Wall Motion / EF  . Myocardial Perfusion Imaging    Disposition: Follow-up with me in 4 months.  Signed, Satira Sark, MD, [facility] 11/05/2015 3:26 PM    [facility] at [place], [place], [place] [address] Phone: [phone]; Fax: [phone]

## 2015-11-07 DIAGNOSIS — I2510 Atherosclerotic heart disease of native coronary artery without angina pectoris: Secondary | ICD-10-CM | POA: Diagnosis not present

## 2015-11-07 DIAGNOSIS — Z955 Presence of coronary angioplasty implant and graft: Secondary | ICD-10-CM | POA: Diagnosis not present

## 2015-11-07 DIAGNOSIS — I252 Old myocardial infarction: Secondary | ICD-10-CM | POA: Diagnosis not present

## 2015-11-11 ENCOUNTER — Encounter (HOSPITAL_COMMUNITY)
Admission: RE | Admit: 2015-11-11 | Discharge: 2015-11-11 | Disposition: A | Discharge status: Home / Self Care | Payer: Medicare Other | Source: Ambulatory Visit | Attending: Cardiology | Admitting: Cardiology

## 2015-11-11 ENCOUNTER — Encounter (HOSPITAL_COMMUNITY): Payer: Self-pay

## 2015-11-11 ENCOUNTER — Inpatient Hospital Stay (HOSPITAL_COMMUNITY): Admission: RE | Admit: 2015-11-11 | Payer: Medicare Other | Source: Ambulatory Visit

## 2015-11-11 DIAGNOSIS — I2510 Atherosclerotic heart disease of native coronary artery without angina pectoris: Secondary | ICD-10-CM | POA: Insufficient documentation

## 2015-11-11 LAB — NM MYOCAR MULTI W/SPECT W/WALL MOTION / EF
CHL CUP NUCLEAR SRS: 2
CHL CUP NUCLEAR SSS: 7
CHL CUP RESTING HR STRESS: 60 {beats}/min
LHR: 0.3
LV sys vol: 20 mL
LVDIAVOL: 44 mL (ref 46–106)
NUC STRESS TID: 0.86
Peak HR: 96 {beats}/min
SDS: 5

## 2015-11-11 MED ORDER — SODIUM CHLORIDE 0.9% FLUSH
INTRAVENOUS | Status: AC
  Administered 2015-11-11: 10 mL via INTRAVENOUS
  Filled 2015-11-11: qty 10

## 2015-11-11 MED ORDER — REGADENOSON 0.4 MG/5ML IV SOLN
INTRAVENOUS | Status: AC
  Administered 2015-11-11: 0.4 mg via INTRAVENOUS
  Filled 2015-11-11: qty 5

## 2015-11-11 MED ORDER — SODIUM CHLORIDE 0.9% FLUSH
INTRAVENOUS | Status: AC
  Filled 2015-11-11: qty 120

## 2015-11-11 MED ORDER — TECHNETIUM TC 99M TETROFOSMIN IV KIT
30.0000 | PACK | Freq: Once | INTRAVENOUS | Status: AC | PRN
  Administered 2015-11-11: 30 via INTRAVENOUS

## 2015-11-11 MED ORDER — TECHNETIUM TC 99M TETROFOSMIN IV KIT
10.0000 | PACK | Freq: Once | INTRAVENOUS | Status: AC | PRN
  Administered 2015-11-11: 10 via INTRAVENOUS

## 2015-11-12 ENCOUNTER — Telehealth: Payer: Self-pay | Admitting: *Deleted

## 2015-11-12 NOTE — Telephone Encounter (Signed)
-----   Message from Satira Sark, MD sent at 11/11/2015  3:23 PM EDT ----- Results reviewed. No significant degree of anterior wall ischemia to correspond with the moderate LAD disease noted at cardiac catheterization when she had her circumflex stent placed. Since she has been symptomatically stable recently, would continued medical therapy and observation. A copy of this test should be forwarded to Neale Burly, MD.

## 2015-11-13 NOTE — Telephone Encounter (Signed)
Patient informed and copy sent to PCP. 

## 2015-11-19 DIAGNOSIS — M0589 Other rheumatoid arthritis with rheumatoid factor of multiple sites: Secondary | ICD-10-CM | POA: Diagnosis not present

## 2015-11-19 DIAGNOSIS — I2109 ST elevation (STEMI) myocardial infarction involving other coronary artery of anterior wall: Secondary | ICD-10-CM | POA: Diagnosis not present

## 2015-11-19 DIAGNOSIS — E11610 Type 2 diabetes mellitus with diabetic neuropathic arthropathy: Secondary | ICD-10-CM | POA: Diagnosis not present

## 2015-11-19 DIAGNOSIS — I10 Essential (primary) hypertension: Secondary | ICD-10-CM | POA: Diagnosis not present

## 2015-11-19 DIAGNOSIS — G4733 Obstructive sleep apnea (adult) (pediatric): Secondary | ICD-10-CM | POA: Diagnosis not present

## 2015-12-10 DIAGNOSIS — Z23 Encounter for immunization: Secondary | ICD-10-CM | POA: Diagnosis not present

## 2015-12-22 DIAGNOSIS — I2109 ST elevation (STEMI) myocardial infarction involving other coronary artery of anterior wall: Secondary | ICD-10-CM | POA: Diagnosis not present

## 2015-12-22 DIAGNOSIS — I2584 Coronary atherosclerosis due to calcified coronary lesion: Secondary | ICD-10-CM | POA: Diagnosis not present

## 2015-12-22 DIAGNOSIS — G4733 Obstructive sleep apnea (adult) (pediatric): Secondary | ICD-10-CM | POA: Diagnosis not present

## 2015-12-22 DIAGNOSIS — E11610 Type 2 diabetes mellitus with diabetic neuropathic arthropathy: Secondary | ICD-10-CM | POA: Diagnosis not present

## 2015-12-22 DIAGNOSIS — M0589 Other rheumatoid arthritis with rheumatoid factor of multiple sites: Secondary | ICD-10-CM | POA: Diagnosis not present

## 2015-12-22 DIAGNOSIS — R0981 Nasal congestion: Secondary | ICD-10-CM | POA: Diagnosis not present

## 2015-12-22 DIAGNOSIS — E784 Other hyperlipidemia: Secondary | ICD-10-CM | POA: Diagnosis not present

## 2015-12-24 ENCOUNTER — Other Ambulatory Visit: Payer: Self-pay | Admitting: Cardiology

## 2015-12-24 MED ORDER — TICAGRELOR 90 MG PO TABS: 90.0000 mg | ORAL_TABLET | Freq: Two times a day (BID) | ORAL | 0 refills | Status: DC

## 2015-12-24 NOTE — Telephone Encounter (Signed)
Is in need of Brilanta samples  Took last one this morning.  Has order thru mail pharmacy but not received

## 2016-01-13 DIAGNOSIS — Z1231 Encounter for screening mammogram for malignant neoplasm of breast: Secondary | ICD-10-CM | POA: Diagnosis not present

## 2016-02-09 DIAGNOSIS — E11610 Type 2 diabetes mellitus with diabetic neuropathic arthropathy: Secondary | ICD-10-CM | POA: Diagnosis not present

## 2016-02-09 DIAGNOSIS — I10 Essential (primary) hypertension: Secondary | ICD-10-CM | POA: Diagnosis not present

## 2016-02-09 DIAGNOSIS — G4733 Obstructive sleep apnea (adult) (pediatric): Secondary | ICD-10-CM | POA: Diagnosis not present

## 2016-02-09 DIAGNOSIS — I2109 ST elevation (STEMI) myocardial infarction involving other coronary artery of anterior wall: Secondary | ICD-10-CM | POA: Diagnosis not present

## 2016-02-09 DIAGNOSIS — M0589 Other rheumatoid arthritis with rheumatoid factor of multiple sites: Secondary | ICD-10-CM | POA: Diagnosis not present

## 2016-02-10 DIAGNOSIS — K2960 Other gastritis without bleeding: Secondary | ICD-10-CM | POA: Diagnosis not present

## 2016-03-10 ENCOUNTER — Ambulatory Visit: Payer: Medicare Other | Admitting: Cardiology

## 2016-03-19 DIAGNOSIS — I10 Essential (primary) hypertension: Secondary | ICD-10-CM | POA: Diagnosis not present

## 2016-03-19 DIAGNOSIS — E11610 Type 2 diabetes mellitus with diabetic neuropathic arthropathy: Secondary | ICD-10-CM | POA: Diagnosis not present

## 2016-03-19 DIAGNOSIS — I2109 ST elevation (STEMI) myocardial infarction involving other coronary artery of anterior wall: Secondary | ICD-10-CM | POA: Diagnosis not present

## 2016-03-19 DIAGNOSIS — M0589 Other rheumatoid arthritis with rheumatoid factor of multiple sites: Secondary | ICD-10-CM | POA: Diagnosis not present

## 2016-03-25 DIAGNOSIS — G4733 Obstructive sleep apnea (adult) (pediatric): Secondary | ICD-10-CM | POA: Diagnosis not present

## 2016-03-25 DIAGNOSIS — E11610 Type 2 diabetes mellitus with diabetic neuropathic arthropathy: Secondary | ICD-10-CM | POA: Diagnosis not present

## 2016-03-25 DIAGNOSIS — I2584 Coronary atherosclerosis due to calcified coronary lesion: Secondary | ICD-10-CM | POA: Diagnosis not present

## 2016-03-25 DIAGNOSIS — I10 Essential (primary) hypertension: Secondary | ICD-10-CM | POA: Diagnosis not present

## 2016-03-25 DIAGNOSIS — Z79899 Other long term (current) drug therapy: Secondary | ICD-10-CM | POA: Diagnosis not present

## 2016-03-25 DIAGNOSIS — E784 Other hyperlipidemia: Secondary | ICD-10-CM | POA: Diagnosis not present

## 2016-03-25 DIAGNOSIS — M0589 Other rheumatoid arthritis with rheumatoid factor of multiple sites: Secondary | ICD-10-CM | POA: Diagnosis not present

## 2016-04-08 ENCOUNTER — Ambulatory Visit: Payer: Medicare Other | Admitting: Cardiology

## 2016-04-09 ENCOUNTER — Telehealth: Payer: Self-pay | Admitting: Cardiology

## 2016-04-09 MED ORDER — TICAGRELOR 90 MG PO TABS: 90.0000 mg | ORAL_TABLET | Freq: Two times a day (BID) | ORAL | 0 refills | Status: DC

## 2016-04-09 NOTE — Telephone Encounter (Signed)
Diana Patterson called stating that she needs to speak with someone about her ticagrelor (BRILINTA) 90 MG

## 2016-04-09 NOTE — Telephone Encounter (Signed)
Patient walked into office.  Samples provided.

## 2016-04-13 DIAGNOSIS — R55 Syncope and collapse: Secondary | ICD-10-CM | POA: Diagnosis not present

## 2016-04-13 DIAGNOSIS — Z87891 Personal history of nicotine dependence: Secondary | ICD-10-CM | POA: Diagnosis not present

## 2016-04-13 DIAGNOSIS — I252 Old myocardial infarction: Secondary | ICD-10-CM | POA: Diagnosis not present

## 2016-04-13 DIAGNOSIS — I10 Essential (primary) hypertension: Secondary | ICD-10-CM | POA: Diagnosis not present

## 2016-04-13 DIAGNOSIS — Z79899 Other long term (current) drug therapy: Secondary | ICD-10-CM | POA: Diagnosis not present

## 2016-04-13 DIAGNOSIS — Z7902 Long term (current) use of antithrombotics/antiplatelets: Secondary | ICD-10-CM | POA: Diagnosis not present

## 2016-04-13 DIAGNOSIS — E119 Type 2 diabetes mellitus without complications: Secondary | ICD-10-CM | POA: Diagnosis not present

## 2016-04-13 DIAGNOSIS — R531 Weakness: Secondary | ICD-10-CM | POA: Diagnosis not present

## 2016-04-13 DIAGNOSIS — R1110 Vomiting, unspecified: Secondary | ICD-10-CM | POA: Diagnosis not present

## 2016-04-13 DIAGNOSIS — E785 Hyperlipidemia, unspecified: Secondary | ICD-10-CM | POA: Diagnosis not present

## 2016-04-13 DIAGNOSIS — Z7982 Long term (current) use of aspirin: Secondary | ICD-10-CM | POA: Diagnosis not present

## 2016-04-13 DIAGNOSIS — Z7984 Long term (current) use of oral hypoglycemic drugs: Secondary | ICD-10-CM | POA: Diagnosis not present

## 2016-04-13 DIAGNOSIS — Z955 Presence of coronary angioplasty implant and graft: Secondary | ICD-10-CM | POA: Diagnosis not present

## 2016-04-13 DIAGNOSIS — Z96651 Presence of right artificial knee joint: Secondary | ICD-10-CM | POA: Diagnosis not present

## 2016-04-13 DIAGNOSIS — M1990 Unspecified osteoarthritis, unspecified site: Secondary | ICD-10-CM | POA: Diagnosis not present

## 2016-04-13 DIAGNOSIS — R42 Dizziness and giddiness: Secondary | ICD-10-CM | POA: Diagnosis not present

## 2016-04-15 DIAGNOSIS — H81393 Other peripheral vertigo, bilateral: Secondary | ICD-10-CM | POA: Diagnosis not present

## 2016-04-15 DIAGNOSIS — J302 Other seasonal allergic rhinitis: Secondary | ICD-10-CM | POA: Diagnosis not present

## 2016-04-22 DIAGNOSIS — E11610 Type 2 diabetes mellitus with diabetic neuropathic arthropathy: Secondary | ICD-10-CM | POA: Diagnosis not present

## 2016-04-22 DIAGNOSIS — G4733 Obstructive sleep apnea (adult) (pediatric): Secondary | ICD-10-CM | POA: Diagnosis not present

## 2016-04-22 DIAGNOSIS — E784 Other hyperlipidemia: Secondary | ICD-10-CM | POA: Diagnosis not present

## 2016-04-22 DIAGNOSIS — M0589 Other rheumatoid arthritis with rheumatoid factor of multiple sites: Secondary | ICD-10-CM | POA: Diagnosis not present

## 2016-04-22 DIAGNOSIS — I2584 Coronary atherosclerosis due to calcified coronary lesion: Secondary | ICD-10-CM | POA: Diagnosis not present

## 2016-04-22 DIAGNOSIS — I10 Essential (primary) hypertension: Secondary | ICD-10-CM | POA: Diagnosis not present

## 2016-05-10 DIAGNOSIS — N183 Chronic kidney disease, stage 3 (moderate): Secondary | ICD-10-CM | POA: Diagnosis not present

## 2016-05-10 DIAGNOSIS — R809 Proteinuria, unspecified: Secondary | ICD-10-CM | POA: Diagnosis not present

## 2016-05-10 DIAGNOSIS — I129 Hypertensive chronic kidney disease with stage 1 through stage 4 chronic kidney disease, or unspecified chronic kidney disease: Secondary | ICD-10-CM | POA: Diagnosis not present

## 2016-05-10 DIAGNOSIS — Z79899 Other long term (current) drug therapy: Secondary | ICD-10-CM | POA: Diagnosis not present

## 2016-05-10 DIAGNOSIS — E559 Vitamin D deficiency, unspecified: Secondary | ICD-10-CM | POA: Diagnosis not present

## 2016-05-10 DIAGNOSIS — D509 Iron deficiency anemia, unspecified: Secondary | ICD-10-CM | POA: Diagnosis not present

## 2016-05-11 ENCOUNTER — Encounter: Payer: Self-pay | Admitting: Cardiology

## 2016-05-11 ENCOUNTER — Ambulatory Visit (INDEPENDENT_AMBULATORY_CARE_PROVIDER_SITE_OTHER): Payer: Medicare Other | Admitting: Cardiology

## 2016-05-11 VITALS — BP 110/68 | HR 75 | Ht 66.0 in | Wt 214.0 lb

## 2016-05-11 DIAGNOSIS — I10 Essential (primary) hypertension: Secondary | ICD-10-CM | POA: Diagnosis not present

## 2016-05-11 DIAGNOSIS — N183 Chronic kidney disease, stage 3 unspecified: Secondary | ICD-10-CM

## 2016-05-11 DIAGNOSIS — E782 Mixed hyperlipidemia: Secondary | ICD-10-CM

## 2016-05-11 DIAGNOSIS — I2510 Atherosclerotic heart disease of native coronary artery without angina pectoris: Secondary | ICD-10-CM

## 2016-05-11 NOTE — Patient Instructions (Signed)
Your physician wants you to follow-up in: 6 months with Dr. McDowell You will receive a reminder letter in the mail two months in advance. If you don't receive a letter, please call our office to schedule the follow-up appointment.  Your physician recommends that you continue on your current medications as directed. Please refer to the Current Medication list given to you today.  Thank you for choosing [facility]!!    

## 2016-05-11 NOTE — Progress Notes (Signed)
Cardiology Office Note  Date: 05/11/2016   ID: Rupinder, Diana Patterson 02/12/1946, MRN [id number]  PCP: Neale Burly, MD  Primary Cardiologist: Rozann Lesches, MD   Chief Complaint  Patient presents with  . Coronary Artery Disease    History of Present Illness: Diana Patterson is a 71 y.o. female last seen in August 2017. She is here with her husband for a follow-up visit. Reports no angina symptoms since last encounter. She has not been exercising as regularly.  Follow-up Lexiscan Myoview from August 2017 is outlined below, overall low risk study. I reviewed this with her today.  We went over her current cardiac medications which are unchanged. She remains on aspirin and Brilinta. Reports some nosebleeds after she uses her CPAP, suspect also related to the low humidity.  She continues to follow with Dr. Lowanda Foster, had recent lab work done at [facility].  Past Medical History:  Diagnosis Date  . CAD (coronary artery disease)    DES to circumflex June 2017, moderate residual LAD disease  . CKD (chronic kidney disease) stage 3, GFR 30-59 ml/min    Dr. Lowanda Foster  . Essential hypertension, benign   . Mixed hyperlipidemia   . NSTEMI (non-ST elevated myocardial infarction) ([place])    Probable type II event, negative Cardiolite 2012  . TIA (transient ischemic attack)   . Type 2 diabetes mellitus ([place])     Past Surgical History:  Procedure Laterality Date  . CARDIAC CATHETERIZATION N/A 08/23/2015   Procedure: Left Heart Cath and Coronary Angiography;  Surgeon: Leonie Man, MD;  Location: [facility];  Service: Cardiovascular;  Laterality: N/A;  . CARDIAC CATHETERIZATION N/A 08/23/2015   Procedure: Coronary Stent Intervention;  Surgeon: Leonie Man, MD;  Location: [facility] [facility];  Service: Cardiovascular;  Laterality: N/A;  . Tubal pregnancy removal      Current Outpatient Prescriptions  Medication Sig Dispense Refill  . aspirin 81 MG tablet Take 81 mg  by mouth daily.      [name] atorvastatin (LIPITOR) 80 MG tablet Take 1 tablet (80 mg total) by mouth daily at 6 PM. 30 tablet 5  . Calcium Carb-Cholecalciferol (CALCIUM 1000 + D) 1000-800 MG-UNIT TABS Take 1 tablet by mouth daily.     . Choline Fenofibrate (TRILIPIX) 135 MG capsule Take 135 mg by mouth daily.      . folic acid (FOLVITE) 1 MG tablet Take 1 mg by mouth See admin instructions. Pt takes every day except Saturday    . furosemide (LASIX) 20 MG tablet Take 20 mg by mouth daily.     . isosorbide dinitrate (ISORDIL) 30 MG tablet Take 1 tablet (30 mg total) by mouth daily. 30 tablet 5  . levocetirizine (XYZAL) 5 MG tablet Take 5 mg by mouth every evening.    [name] losartan (COZAAR) 25 MG tablet Take 25 mg by mouth daily.      . metaxalone (SKELAXIN) 800 MG tablet Take 800 mg by mouth 2 (two) times daily.     . metFORMIN (GLUCOPHAGE) 500 MG tablet Take 500 mg by mouth 2 (two) times daily with a meal.    . methotrexate (RHEUMATREX) 2.5 MG tablet Take 20 mg by mouth once a week. 8 tabs once weekly.  Pt takes on Saturday    . metoprolol tartrate (LOPRESSOR) 50 MG tablet Take 1 tablet (50 mg total) by mouth 2 (two) times daily. 60 tablet 5  . Omega-3 Fatty Acids (FISH OIL) 1000 MG CAPS Take  1 capsule by mouth 2 (two) times daily.     [name] POLYSACCH FE COMPLEX-VIT D3 PO Take 1 tablet by mouth daily.    . Polysaccharide Iron Complex (POLY-IRON 150 PO) Take by mouth daily.    . potassium chloride SA (K-DUR,KLOR-CON) 20 MEQ tablet Take 20 mEq by mouth daily.    . ticagrelor (BRILINTA) 90 MG TABS tablet Take 1 tablet (90 mg total) by mouth 2 (two) times daily. 32 tablet 0  . valACYclovir (VALTREX) 500 MG tablet Take 500 mg by mouth daily.     No current facility-administered medications for this visit.    Allergies:  Morphine and related   Social History: The patient  reports that she quit smoking about 38 years ago. Her smoking use included Cigarettes. She has a 20.00 pack-year smoking history. She has  never used smokeless tobacco. She reports that she does not drink alcohol or use drugs.   ROS:  Please see the history of present illness. Otherwise, complete review of systems is positive for none.  All other systems are reviewed and negative.   Physical Exam: VS:  BP 110/68   Pulse 75   Ht 5\' 6"  (1.676 m)   Wt 214 lb (97.1 kg)   SpO2 99%   BMI 34.54 kg/m , BMI Body mass index is 34.54 kg/m.  Wt Readings from Last 3 Encounters:  05/11/16 214 lb (97.1 kg)  11/05/15 229 lb 12.8 oz (104.2 kg)  09/10/15 237 lb (107.5 kg)    General: Patient appears comfortable at rest. HEENT: Conjunctiva and lids normal, oropharynx clear.  Neck: Supple, no elevated JVP or carotid bruits, no thyromegaly.  Lungs: Clear to auscultation, nonlabored breathing at rest.  Cardiac: Regular rate and rhythm, no S3 or significant systolic murmur, no pericardial rub.  Abdomen: Soft, nontender,bowel sounds present, no guarding or rebound.  Extremities: No pitting edema, distal pulses 1-2+. Right radial arteriotomy site healing well. Skin: Warm and dry. Musculoskeletal: No kyphosis. Neuropsychiatric: Alert and oriented 3, affect appropriate.  ECG: I personally reviewed the tracing from 09/10/2015 which showed sinus rhythm with low voltage and decreased R wave progression.  Recent Labwork: 08/24/2015: BUN 26; Creatinine, Ser 1.49; Hemoglobin 9.6; Platelets 346; Potassium 3.8; Sodium 137   Other Studies Reviewed Today:  Cardiac catheterization 08/23/2015:  Prox Cx to Mid Cx lesion, 90% stenosed. Post intervention aspiration thrombectomy followed by Promus Premier DES 3.5 mm x 12 mm (3.75 mm) , there is a 0% residual stenosis.  Prox LAD-2 lesion, 50-75% stenosed.  The left ventricular systolic function is normal.  Elevated LVEDP  Likely culprit lesion was the thrombotic stenosis of the circumflex. It would appear that there was reperfusion after heparin and aspirin was given in the emergency room. This  would be consistent with her improved chest pain upon arrival.  Successful PCI of the proximal dominant Circumflex. There is residual disease in the proximal-mid LAD prior to D1. This does not appear to be flow-limiting, however would probably benefit from being investigated. I would determine course of action based on her symptoms. If she is a symptomatically, could consider discharge with plans for outpatient Myoview to basket the LAD, otherwise if she has symptoms of angina following this PCI, I would proceed with staged PCI of the LAD prior to her discharge.  Echocardiogram 08/25/2015: Study Conclusions  - Left ventricle: The cavity size was normal. Wall thickness was  increased in a pattern of mild LVH. Systolic function was normal.  The estimated ejection fraction was  in the range of 60% to 65%.  Wall motion was normal; there were no regional wall motion  abnormalities. Doppler parameters are consistent with abnormal  left ventricular relaxation (grade 1 diastolic dysfunction). - Mitral valve: Mildly calcified annulus.  Lexiscan Myoview 11/11/2015:  No diagnostic ST segment changes to indicate ischemia.  Small, mild intensity, apical anterior defect that is predominantly fixed and most consistent with variable soft tissue attenuation. No large degree of anterior ischemia noted.  This is a low risk study.  Nuclear stress EF: 54%.  Assessment and Plan:  1. CAD status post DES to the circumflex in June 2017 with moderate residual LAD disease. Myoview from last year showed no significant anterior ischemia on medical therapy, overall low risk. Continue with current regimen, anticipate stopping Brilinta this summer.  2. CKD, stage 3, followed by Dr. Lowanda Foster.  3. Essential hypertension, blood pressure well controlled today. Continues on Cozaar and Lopressor.  4. Hyperlipidemia, on Lipitor and omega-3 supplements. Following lab work with Dr. Sherrie Sport.  Current medicines were  reviewed with the patient today.  Disposition: Follow-up in 6 months.  Signed, Satira Sark, MD, [facility] 05/11/2016 11:19 AM    [facility] [address], [place], [place] [address] Phone: [phone]; Fax: [phone]

## 2016-05-19 DIAGNOSIS — N183 Chronic kidney disease, stage 3 (moderate): Secondary | ICD-10-CM | POA: Diagnosis not present

## 2016-05-19 DIAGNOSIS — E559 Vitamin D deficiency, unspecified: Secondary | ICD-10-CM | POA: Diagnosis not present

## 2016-05-19 DIAGNOSIS — E669 Obesity, unspecified: Secondary | ICD-10-CM | POA: Diagnosis not present

## 2016-05-19 DIAGNOSIS — D649 Anemia, unspecified: Secondary | ICD-10-CM | POA: Diagnosis not present

## 2016-06-15 DIAGNOSIS — I2584 Coronary atherosclerosis due to calcified coronary lesion: Secondary | ICD-10-CM | POA: Diagnosis not present

## 2016-06-15 DIAGNOSIS — E784 Other hyperlipidemia: Secondary | ICD-10-CM | POA: Diagnosis not present

## 2016-06-15 DIAGNOSIS — M0589 Other rheumatoid arthritis with rheumatoid factor of multiple sites: Secondary | ICD-10-CM | POA: Diagnosis not present

## 2016-06-15 DIAGNOSIS — I10 Essential (primary) hypertension: Secondary | ICD-10-CM | POA: Diagnosis not present

## 2016-06-15 DIAGNOSIS — E11610 Type 2 diabetes mellitus with diabetic neuropathic arthropathy: Secondary | ICD-10-CM | POA: Diagnosis not present

## 2016-06-15 DIAGNOSIS — G4733 Obstructive sleep apnea (adult) (pediatric): Secondary | ICD-10-CM | POA: Diagnosis not present

## 2016-06-25 DIAGNOSIS — Z0000 Encounter for general adult medical examination without abnormal findings: Secondary | ICD-10-CM | POA: Diagnosis not present

## 2016-06-25 DIAGNOSIS — E784 Other hyperlipidemia: Secondary | ICD-10-CM | POA: Diagnosis not present

## 2016-06-25 DIAGNOSIS — M0589 Other rheumatoid arthritis with rheumatoid factor of multiple sites: Secondary | ICD-10-CM | POA: Diagnosis not present

## 2016-06-25 DIAGNOSIS — Z1389 Encounter for screening for other disorder: Secondary | ICD-10-CM | POA: Diagnosis not present

## 2016-06-25 DIAGNOSIS — I2584 Coronary atherosclerosis due to calcified coronary lesion: Secondary | ICD-10-CM | POA: Diagnosis not present

## 2016-06-25 DIAGNOSIS — E11610 Type 2 diabetes mellitus with diabetic neuropathic arthropathy: Secondary | ICD-10-CM | POA: Diagnosis not present

## 2016-06-25 DIAGNOSIS — R040 Epistaxis: Secondary | ICD-10-CM | POA: Diagnosis not present

## 2016-06-29 ENCOUNTER — Encounter (INDEPENDENT_AMBULATORY_CARE_PROVIDER_SITE_OTHER): Payer: Self-pay | Admitting: Internal Medicine

## 2016-07-01 ENCOUNTER — Encounter (INDEPENDENT_AMBULATORY_CARE_PROVIDER_SITE_OTHER): Payer: Self-pay | Admitting: Internal Medicine

## 2016-07-01 ENCOUNTER — Telehealth: Payer: Self-pay | Admitting: Cardiology

## 2016-07-01 ENCOUNTER — Encounter (INDEPENDENT_AMBULATORY_CARE_PROVIDER_SITE_OTHER): Payer: Self-pay | Admitting: *Deleted

## 2016-07-01 ENCOUNTER — Ambulatory Visit (INDEPENDENT_AMBULATORY_CARE_PROVIDER_SITE_OTHER): Payer: Medicare Other | Admitting: Internal Medicine

## 2016-07-01 VITALS — BP 122/66 | HR 64 | Temp 97.8°F | Ht 66.0 in | Wt 207.7 lb

## 2016-07-01 DIAGNOSIS — R634 Abnormal weight loss: Secondary | ICD-10-CM | POA: Diagnosis not present

## 2016-07-01 DIAGNOSIS — R6881 Early satiety: Secondary | ICD-10-CM

## 2016-07-01 DIAGNOSIS — R110 Nausea: Secondary | ICD-10-CM

## 2016-07-01 DIAGNOSIS — I2510 Atherosclerotic heart disease of native coronary artery without angina pectoris: Secondary | ICD-10-CM | POA: Diagnosis not present

## 2016-07-01 LAB — HEPATIC FUNCTION PANEL
ALK PHOS: 75 U/L (ref 33–130)
ALT: 46 U/L — AB (ref 6–29)
AST: 56 U/L — AB (ref 10–35)
Albumin: 3.2 g/dL — ABNORMAL LOW (ref 3.6–5.1)
BILIRUBIN DIRECT: 0.2 mg/dL (ref <=0.2)
BILIRUBIN INDIRECT: 0.3 mg/dL (ref 0.2–1.2)
Total Bilirubin: 0.5 mg/dL (ref 0.2–1.2)
Total Protein: 6 g/dL — ABNORMAL LOW (ref 6.1–8.1)

## 2016-07-01 LAB — CBC WITH DIFFERENTIAL/PLATELET
BASOS PCT: 0 %
Basophils Absolute: 0 cells/uL (ref 0–200)
EOS ABS: 350 {cells}/uL (ref 15–500)
Eosinophils Relative: 5 %
HEMATOCRIT: 30.3 % — AB (ref 35.0–45.0)
HEMOGLOBIN: 9.7 g/dL — AB (ref 11.7–15.5)
LYMPHS ABS: 1400 {cells}/uL (ref 850–3900)
Lymphocytes Relative: 20 %
MCH: 29.4 pg (ref 27.0–33.0)
MCHC: 32 g/dL (ref 32.0–36.0)
MCV: 91.8 fL (ref 80.0–100.0)
MONO ABS: 770 {cells}/uL (ref 200–950)
MPV: 10.4 fL (ref 7.5–12.5)
Monocytes Relative: 11 %
NEUTROS ABS: 4480 {cells}/uL (ref 1500–7800)
Neutrophils Relative %: 64 %
Platelets: 439 10*3/uL — ABNORMAL HIGH (ref 140–400)
RBC: 3.3 MIL/uL — AB (ref 3.80–5.10)
RDW: 16.5 % — AB (ref 11.0–15.0)
WBC: 7 10*3/uL (ref 3.8–10.8)

## 2016-07-01 NOTE — Telephone Encounter (Signed)
Name of person who walked in: [facility]  What is needed(details of request)? Dr Sherrie Sport stopped her Epifania Gore due to nose bleeds since Jan 2017 and sinus drainage. Patient wanted to verify if this was ok with Dr Michiel Cowboy or medical issue (Chest/Arm pain, neck pain, Shortness of Breath, dizziness, weak)-Nurse to be contacted immediately.  Non-Urgent Message-Route message to nurse/provider.

## 2016-07-01 NOTE — Patient Instructions (Signed)
Labs and CT.  

## 2016-07-01 NOTE — Progress Notes (Signed)
Subjective:    Patient ID: Diana Patterson, female    DOB: 1945-05-19, 71 y.o.   MRN: [id number]  HPI Referred by Dr. Sherrie Sport for nausea. She tells me she has been nauseated since September. Foods don't taste or smell good.  She says she has had a sinus drainage since December. She says she thinks she has nasal drainage and this makes her nauseated since September. Has not been nauseated this week.  No acid reflux. Appetite is not good.  She says Dr. Sherrie Sport cauterized her nare last week because of the nose bleed. Daughter states that mother has lost about 42 pounds over the past 6 months.  Last colonoscopy 2 yr ago by Dr. Britta Mccreedy and was normal.  Hx of MI April 2017 and has 2 stents.        Review of Systems Past Medical History:  Diagnosis Date  . CAD (coronary artery disease)    DES to circumflex June 2017, moderate residual LAD disease  . CKD (chronic kidney disease) stage 3, GFR 30-59 ml/min    Dr. Lowanda Foster  . Essential hypertension, benign   . Mixed hyperlipidemia   . NSTEMI (non-ST elevated myocardial infarction) ([place])    Probable type II event, negative Cardiolite 2012  . TIA (transient ischemic attack)   . Type 2 diabetes mellitus ([place])     Past Surgical History:  Procedure Laterality Date  . CARDIAC CATHETERIZATION N/A 08/23/2015   Procedure: Left Heart Cath and Coronary Angiography;  Surgeon: Leonie Man, MD;  Location: [facility];  Service: Cardiovascular;  Laterality: N/A;  . CARDIAC CATHETERIZATION N/A 08/23/2015   Procedure: Coronary Stent Intervention;  Surgeon: Leonie Man, MD;  Location: [place];  Service: Cardiovascular;  Laterality: N/A;  . Tubal pregnancy removal      Allergies  Allergen Reactions  . Morphine And Related Other (See Comments)    Drowsiness    Current Outpatient Prescriptions on File Prior to Visit  Medication Sig Dispense Refill  . aspirin 81 MG tablet Take 81 mg by mouth daily.      [name] atorvastatin  (LIPITOR) 80 MG tablet Take 1 tablet (80 mg total) by mouth daily at 6 PM. 30 tablet 5  . Calcium Carb-Cholecalciferol (CALCIUM 1000 + D) 1000-800 MG-UNIT TABS Take 1 tablet by mouth daily.     . folic acid (FOLVITE) 1 MG tablet Take 1 mg by mouth See admin instructions. Pt takes every day except Saturday    . furosemide (LASIX) 20 MG tablet Take 20 mg by mouth daily.     . isosorbide dinitrate (ISORDIL) 30 MG tablet Take 1 tablet (30 mg total) by mouth daily. 30 tablet 5  . levocetirizine (XYZAL) 5 MG tablet Take 5 mg by mouth every evening.    [name] losartan (COZAAR) 25 MG tablet Take 25 mg by mouth daily.      . metaxalone (SKELAXIN) 800 MG tablet Take 800 mg by mouth 2 (two) times daily.     . metFORMIN (GLUCOPHAGE) 500 MG tablet Take 500 mg by mouth 2 (two) times daily with a meal.    . methotrexate (RHEUMATREX) 2.5 MG tablet Take 20 mg by mouth once a week. 8 tabs once weekly.  Pt takes on Saturday    . metoprolol tartrate (LOPRESSOR) 50 MG tablet Take 1 tablet (50 mg total) by mouth 2 (two) times daily. 60 tablet 5  . Omega-3 Fatty Acids (FISH OIL) 1000 MG CAPS Take 1 capsule by  mouth 2 (two) times daily.     . Polysaccharide Iron Complex (POLY-IRON 150 PO) Take by mouth daily.    . potassium chloride SA (K-DUR,KLOR-CON) 20 MEQ tablet Take 20 mEq by mouth daily.    . valACYclovir (VALTREX) 500 MG tablet Take 500 mg by mouth daily.    . Choline Fenofibrate (TRILIPIX) 135 MG capsule Take 135 mg by mouth daily.      [name] POLYSACCH FE COMPLEX-VIT D3 PO Take 1 tablet by mouth daily.     No current facility-administered medications on file prior to visit.        Objective:   Physical Exam Blood pressure 122/66, pulse 64, temperature 97.8 F (36.6 C), height 5\' 6"  (1.676 m), weight 207 lb 11.2 oz (94.2 kg). Alert and oriented. Skin warm and dry. Oral mucosa is moist.   . Sclera anicteric, conjunctivae is pink. Thyroid not enlarged. No cervical lymphadenopathy. Lungs clear. Heart regular rate and  rhythm.  Abdomen is soft. Bowel sounds are positive. No hepatomegaly. No abdominal masses felt. No tenderness.  No edema to lower extremities. P         Assessment & Plan:  Nausea. Weight loss. Early satiety. Am going to get CT abdomen/pelvis with CM . CBC and Hepatic function.

## 2016-07-01 NOTE — Telephone Encounter (Signed)
Pt voiced understanding

## 2016-07-01 NOTE — Telephone Encounter (Signed)
Pt says she was instructed by Dr Sherrie Sport to stop Brilinta as of 06/25/16 - says she has had nose bleeds since Jan 2018 also says she was referred to ENT as well for this - pt says she stopped Brilinta as instructed but wanted Dr Laverle Patter

## 2016-07-01 NOTE — Telephone Encounter (Signed)
It is somewhat early to stop DAPT since her DES intervention was in June of last year, however since she has had significant recurrent bleeding, this is reasonable particularly since she did achieve 10 months of therapy.

## 2016-07-12 ENCOUNTER — Encounter (INDEPENDENT_AMBULATORY_CARE_PROVIDER_SITE_OTHER): Payer: Self-pay

## 2016-07-12 ENCOUNTER — Encounter (INDEPENDENT_AMBULATORY_CARE_PROVIDER_SITE_OTHER): Payer: Self-pay | Admitting: Internal Medicine

## 2016-07-12 ENCOUNTER — Ambulatory Visit (INDEPENDENT_AMBULATORY_CARE_PROVIDER_SITE_OTHER): Payer: Medicare Other | Admitting: Internal Medicine

## 2016-07-12 VITALS — BP 128/68 | HR 65 | Temp 97.6°F | Ht 66.0 in | Wt 205.3 lb

## 2016-07-12 DIAGNOSIS — I2510 Atherosclerotic heart disease of native coronary artery without angina pectoris: Secondary | ICD-10-CM

## 2016-07-12 DIAGNOSIS — K529 Noninfective gastroenteritis and colitis, unspecified: Secondary | ICD-10-CM | POA: Diagnosis not present

## 2016-07-12 NOTE — Progress Notes (Signed)
Subjective:    Patient ID: Diana Patterson, female    DOB: [birth year], 71 y.o.   MRN: [id number]  HPI Here today with c/o burning in her esophagus. Last seen 07/01/2016 for nausea.  She tells me Friday she vomited all day Friday. Everything she ate, she vomited. Saturday she had diarrhea. She broke out in a sweat, and was going to pass out. She drank some ginger ale and crackers. She ate pasta Saturday evening which stayed down. Yesterday she ate apple sauce and half of a peanut butter sandwich, chicken pie from [employer].  She says she feels slight weak.  She has not had a BM today.  She says her esophagus feels a little sore.    Last colonoscopy 2 yrs ago by DR. Britta Mccreedy was normal.   Hepatic Function Panel     Component Value Date/Time   PROT 6.0 (L) 07/01/2016 1103   ALBUMIN 3.2 (L) 07/01/2016 1103   AST 56 (H) 07/01/2016 1103   ALT 46 (H) 07/01/2016 1103   ALKPHOS 75 07/01/2016 1103   BILITOT 0.5 07/01/2016 1103   BILIDIR 0.2 07/01/2016 1103   IBILI 0.3 07/01/2016 1103   CBC    Component Value Date/Time   WBC 7.0 07/01/2016 1103   RBC 3.30 (L) 07/01/2016 1103   HGB 9.7 (L) 07/01/2016 1103   HCT 30.3 (L) 07/01/2016 1103   PLT 439 (H) 07/01/2016 1103   MCV 91.8 07/01/2016 1103   MCH 29.4 07/01/2016 1103   MCHC 32.0 07/01/2016 1103   RDW 16.5 (H) 07/01/2016 1103   LYMPHSABS 1,400 07/01/2016 1103   MONOABS 770 07/01/2016 1103   EOSABS 350 07/01/2016 1103   BASOSABS 0 07/01/2016 1103    Review of Systems Past Medical History:  Diagnosis Date  . CAD (coronary artery disease)    DES to circumflex June 2017, moderate residual LAD disease  . CKD (chronic kidney disease) stage 3, GFR 30-59 ml/min    Dr. Lowanda Foster  . Essential hypertension, benign   . Mixed hyperlipidemia   . NSTEMI (non-ST elevated myocardial infarction) ([place])    Probable type II event, negative Cardiolite 2012  . TIA (transient ischemic attack)   . Type 2 diabetes mellitus ([place])      Past Surgical History:  Procedure Laterality Date  . CARDIAC CATHETERIZATION N/A 08/23/2015   Procedure: Left Heart Cath and Coronary Angiography;  Surgeon: Leonie Man, MD;  Location: [place] [facility];  Service: Cardiovascular;  Laterality: N/A;  . CARDIAC CATHETERIZATION N/A 08/23/2015   Procedure: Coronary Stent Intervention;  Surgeon: Leonie Man, MD;  Location: [place] [facility];  Service: Cardiovascular;  Laterality: N/A;  . Tubal pregnancy removal      Allergies  Allergen Reactions  . Morphine And Related Other (See Comments)    Drowsiness    Current Outpatient Prescriptions on File Prior to Visit  Medication Sig Dispense Refill  . aspirin 81 MG tablet Take 81 mg by mouth daily.      [name] atorvastatin (LIPITOR) 80 MG tablet Take 1 tablet (80 mg total) by mouth daily at 6 PM. 30 tablet 5  . Calcium Carb-Cholecalciferol (CALCIUM 1000 + D) 1000-800 MG-UNIT TABS Take 1 tablet by mouth daily.     . Choline Fenofibrate (TRILIPIX) 135 MG capsule Take 135 mg by mouth daily.      . folic acid (FOLVITE) 1 MG tablet Take 1 mg by mouth See admin instructions. Pt takes every day except Saturday    .  furosemide (LASIX) 20 MG tablet Take 20 mg by mouth daily.     . isosorbide dinitrate (ISORDIL) 30 MG tablet Take 1 tablet (30 mg total) by mouth daily. 30 tablet 5  . levocetirizine (XYZAL) 5 MG tablet Take 5 mg by mouth every evening.    [name] losartan (COZAAR) 25 MG tablet Take 25 mg by mouth daily.      . metaxalone (SKELAXIN) 800 MG tablet Take 800 mg by mouth 2 (two) times daily.     . metFORMIN (GLUCOPHAGE) 500 MG tablet Take 500 mg by mouth 2 (two) times daily with a meal.    . methotrexate (RHEUMATREX) 2.5 MG tablet Take 20 mg by mouth once a week. 8 tabs once weekly.  Pt takes on Saturday    . metoprolol tartrate (LOPRESSOR) 50 MG tablet Take 1 tablet (50 mg total) by mouth 2 (two) times daily. 60 tablet 5  . Omega-3 Fatty Acids (FISH OIL) 1000 MG CAPS Take 1 capsule by mouth 2  (two) times daily.     [name] POLYSACCH FE COMPLEX-VIT D3 PO Take 1 tablet by mouth daily.    . Polysaccharide Iron Complex (POLY-IRON 150 PO) Take by mouth daily.    . potassium chloride SA (K-DUR,KLOR-CON) 20 MEQ tablet Take 20 mEq by mouth daily.    . valACYclovir (VALTREX) 500 MG tablet Take 500 mg by mouth daily.     No current facility-administered medications on file prior to visit.        Objective:   Physical Exam Blood pressure 128/68, pulse 65, temperature 97.6 F (36.4 C), height 5\' 6"  (1.676 m), weight 205 lb 4.8 oz (93.1 kg).  Alert and oriented. Skin warm and dry. Oral mucosa is moist.   . Sclera anicteric, conjunctivae is pink. Thyroid not enlarged. No cervical lymphadenopathy. Lungs clear. Heart regular rate and rhythm.  Abdomen is soft. Bowel sounds are positive. No hepatomegaly. No abdominal masses felt. No tenderness.  No edema to lower extremities.  .       Assessment & Plan:  Viral gastroenteritis:  Possible GERD. Am going to give her samples of Dexilant. CT ordered for 07/15/2016.

## 2016-07-12 NOTE — Patient Instructions (Signed)
Sample of Dexilant give to patient.

## 2016-07-15 ENCOUNTER — Encounter (HOSPITAL_COMMUNITY): Payer: Self-pay

## 2016-07-15 ENCOUNTER — Ambulatory Visit (HOSPITAL_COMMUNITY)
Admission: RE | Admit: 2016-07-15 | Discharge: 2016-07-15 | Disposition: A | Discharge status: Home / Self Care | Payer: Medicare Other | Source: Ambulatory Visit | Attending: Internal Medicine | Admitting: Internal Medicine

## 2016-07-15 DIAGNOSIS — R6881 Early satiety: Secondary | ICD-10-CM | POA: Diagnosis not present

## 2016-07-15 DIAGNOSIS — R634 Abnormal weight loss: Secondary | ICD-10-CM | POA: Diagnosis not present

## 2016-07-15 DIAGNOSIS — R110 Nausea: Secondary | ICD-10-CM | POA: Diagnosis not present

## 2016-07-15 DIAGNOSIS — R112 Nausea with vomiting, unspecified: Secondary | ICD-10-CM | POA: Diagnosis not present

## 2016-07-15 DIAGNOSIS — R1110 Vomiting, unspecified: Secondary | ICD-10-CM | POA: Diagnosis not present

## 2016-07-15 LAB — POCT I-STAT CREATININE: Creatinine, Ser: 1.5 mg/dL — ABNORMAL HIGH (ref 0.44–1.00)

## 2016-07-15 MED ORDER — IOPAMIDOL (ISOVUE-300) INJECTION 61%
100.0000 mL | Freq: Once | INTRAVENOUS | Status: AC | PRN
  Administered 2016-07-15: 80 mL via INTRAVENOUS

## 2016-07-18 DIAGNOSIS — E785 Hyperlipidemia, unspecified: Secondary | ICD-10-CM | POA: Diagnosis not present

## 2016-07-18 DIAGNOSIS — R112 Nausea with vomiting, unspecified: Secondary | ICD-10-CM | POA: Diagnosis not present

## 2016-07-18 DIAGNOSIS — M1990 Unspecified osteoarthritis, unspecified site: Secondary | ICD-10-CM | POA: Diagnosis not present

## 2016-07-18 DIAGNOSIS — R42 Dizziness and giddiness: Secondary | ICD-10-CM | POA: Diagnosis not present

## 2016-07-18 DIAGNOSIS — Z7982 Long term (current) use of aspirin: Secondary | ICD-10-CM | POA: Diagnosis not present

## 2016-07-18 DIAGNOSIS — R109 Unspecified abdominal pain: Secondary | ICD-10-CM | POA: Diagnosis not present

## 2016-07-18 DIAGNOSIS — I10 Essential (primary) hypertension: Secondary | ICD-10-CM | POA: Diagnosis not present

## 2016-07-18 DIAGNOSIS — R1111 Vomiting without nausea: Secondary | ICD-10-CM | POA: Diagnosis not present

## 2016-07-18 DIAGNOSIS — Z79899 Other long term (current) drug therapy: Secondary | ICD-10-CM | POA: Diagnosis not present

## 2016-07-18 DIAGNOSIS — I252 Old myocardial infarction: Secondary | ICD-10-CM | POA: Diagnosis not present

## 2016-07-18 DIAGNOSIS — Z87891 Personal history of nicotine dependence: Secondary | ICD-10-CM | POA: Diagnosis not present

## 2016-07-19 ENCOUNTER — Telehealth (INDEPENDENT_AMBULATORY_CARE_PROVIDER_SITE_OTHER): Payer: Self-pay | Admitting: Internal Medicine

## 2016-07-19 DIAGNOSIS — K219 Gastro-esophageal reflux disease without esophagitis: Secondary | ICD-10-CM

## 2016-07-19 MED ORDER — DEXLANSOPRAZOLE 60 MG PO CPDR: 60.0000 mg | DELAYED_RELEASE_CAPSULE | Freq: Every day | ORAL | 3 refills | Status: DC

## 2016-07-19 NOTE — Telephone Encounter (Signed)
Rx for Dexilant sent to her pharmacy 

## 2016-07-28 DIAGNOSIS — G4733 Obstructive sleep apnea (adult) (pediatric): Secondary | ICD-10-CM | POA: Diagnosis not present

## 2016-07-28 DIAGNOSIS — M0589 Other rheumatoid arthritis with rheumatoid factor of multiple sites: Secondary | ICD-10-CM | POA: Diagnosis not present

## 2016-07-28 DIAGNOSIS — I10 Essential (primary) hypertension: Secondary | ICD-10-CM | POA: Diagnosis not present

## 2016-07-28 DIAGNOSIS — I2109 ST elevation (STEMI) myocardial infarction involving other coronary artery of anterior wall: Secondary | ICD-10-CM | POA: Diagnosis not present

## 2016-07-28 DIAGNOSIS — E11610 Type 2 diabetes mellitus with diabetic neuropathic arthropathy: Secondary | ICD-10-CM | POA: Diagnosis not present

## 2016-08-02 DIAGNOSIS — Z7984 Long term (current) use of oral hypoglycemic drugs: Secondary | ICD-10-CM | POA: Diagnosis not present

## 2016-08-02 DIAGNOSIS — R001 Bradycardia, unspecified: Secondary | ICD-10-CM | POA: Diagnosis not present

## 2016-08-02 DIAGNOSIS — E785 Hyperlipidemia, unspecified: Secondary | ICD-10-CM | POA: Diagnosis not present

## 2016-08-02 DIAGNOSIS — Z79899 Other long term (current) drug therapy: Secondary | ICD-10-CM | POA: Diagnosis not present

## 2016-08-02 DIAGNOSIS — Z7951 Long term (current) use of inhaled steroids: Secondary | ICD-10-CM | POA: Diagnosis not present

## 2016-08-02 DIAGNOSIS — E119 Type 2 diabetes mellitus without complications: Secondary | ICD-10-CM | POA: Diagnosis not present

## 2016-08-02 DIAGNOSIS — R531 Weakness: Secondary | ICD-10-CM | POA: Diagnosis not present

## 2016-08-02 DIAGNOSIS — I959 Hypotension, unspecified: Secondary | ICD-10-CM | POA: Diagnosis not present

## 2016-08-02 DIAGNOSIS — D649 Anemia, unspecified: Secondary | ICD-10-CM | POA: Diagnosis not present

## 2016-08-02 DIAGNOSIS — Z7982 Long term (current) use of aspirin: Secondary | ICD-10-CM | POA: Diagnosis not present

## 2016-08-02 DIAGNOSIS — R112 Nausea with vomiting, unspecified: Secondary | ICD-10-CM | POA: Diagnosis not present

## 2016-08-02 DIAGNOSIS — D259 Leiomyoma of uterus, unspecified: Secondary | ICD-10-CM | POA: Diagnosis not present

## 2016-08-02 DIAGNOSIS — R404 Transient alteration of awareness: Secondary | ICD-10-CM | POA: Diagnosis not present

## 2016-08-02 DIAGNOSIS — Z955 Presence of coronary angioplasty implant and graft: Secondary | ICD-10-CM | POA: Diagnosis not present

## 2016-08-02 DIAGNOSIS — Z87891 Personal history of nicotine dependence: Secondary | ICD-10-CM | POA: Diagnosis not present

## 2016-08-02 DIAGNOSIS — R42 Dizziness and giddiness: Secondary | ICD-10-CM | POA: Diagnosis not present

## 2016-08-02 DIAGNOSIS — E860 Dehydration: Secondary | ICD-10-CM | POA: Diagnosis not present

## 2016-08-02 DIAGNOSIS — E784 Other hyperlipidemia: Secondary | ICD-10-CM | POA: Diagnosis not present

## 2016-08-02 DIAGNOSIS — R55 Syncope and collapse: Secondary | ICD-10-CM | POA: Diagnosis not present

## 2016-08-02 DIAGNOSIS — I10 Essential (primary) hypertension: Secondary | ICD-10-CM | POA: Diagnosis not present

## 2016-08-02 DIAGNOSIS — Z96651 Presence of right artificial knee joint: Secondary | ICD-10-CM | POA: Diagnosis not present

## 2016-08-02 DIAGNOSIS — M069 Rheumatoid arthritis, unspecified: Secondary | ICD-10-CM | POA: Diagnosis not present

## 2016-08-02 DIAGNOSIS — M0689 Other specified rheumatoid arthritis, multiple sites: Secondary | ICD-10-CM | POA: Diagnosis not present

## 2016-08-02 DIAGNOSIS — Z886 Allergy status to analgesic agent status: Secondary | ICD-10-CM | POA: Diagnosis not present

## 2016-08-02 DIAGNOSIS — R109 Unspecified abdominal pain: Secondary | ICD-10-CM | POA: Diagnosis not present

## 2016-08-02 DIAGNOSIS — J309 Allergic rhinitis, unspecified: Secondary | ICD-10-CM | POA: Diagnosis not present

## 2016-08-02 DIAGNOSIS — Z780 Asymptomatic menopausal state: Secondary | ICD-10-CM | POA: Diagnosis not present

## 2016-08-02 DIAGNOSIS — I252 Old myocardial infarction: Secondary | ICD-10-CM | POA: Diagnosis not present

## 2016-08-02 DIAGNOSIS — R7989 Other specified abnormal findings of blood chemistry: Secondary | ICD-10-CM | POA: Diagnosis not present

## 2016-08-02 DIAGNOSIS — M1990 Unspecified osteoarthritis, unspecified site: Secondary | ICD-10-CM | POA: Diagnosis not present

## 2016-08-03 DIAGNOSIS — R55 Syncope and collapse: Secondary | ICD-10-CM | POA: Diagnosis not present

## 2016-08-03 DIAGNOSIS — E860 Dehydration: Secondary | ICD-10-CM | POA: Diagnosis not present

## 2016-08-03 DIAGNOSIS — M0689 Other specified rheumatoid arthritis, multiple sites: Secondary | ICD-10-CM | POA: Diagnosis not present

## 2016-08-03 DIAGNOSIS — E119 Type 2 diabetes mellitus without complications: Secondary | ICD-10-CM | POA: Diagnosis not present

## 2016-08-03 DIAGNOSIS — E784 Other hyperlipidemia: Secondary | ICD-10-CM | POA: Diagnosis not present

## 2016-08-10 DIAGNOSIS — R55 Syncope and collapse: Secondary | ICD-10-CM | POA: Diagnosis not present

## 2016-08-10 DIAGNOSIS — E860 Dehydration: Secondary | ICD-10-CM | POA: Diagnosis not present

## 2016-09-07 DIAGNOSIS — E11610 Type 2 diabetes mellitus with diabetic neuropathic arthropathy: Secondary | ICD-10-CM | POA: Diagnosis not present

## 2016-09-07 DIAGNOSIS — I2109 ST elevation (STEMI) myocardial infarction involving other coronary artery of anterior wall: Secondary | ICD-10-CM | POA: Diagnosis not present

## 2016-09-07 DIAGNOSIS — I10 Essential (primary) hypertension: Secondary | ICD-10-CM | POA: Diagnosis not present

## 2016-09-07 DIAGNOSIS — G4733 Obstructive sleep apnea (adult) (pediatric): Secondary | ICD-10-CM | POA: Diagnosis not present

## 2016-09-20 DIAGNOSIS — H43393 Other vitreous opacities, bilateral: Secondary | ICD-10-CM | POA: Diagnosis not present

## 2016-10-04 DIAGNOSIS — E11610 Type 2 diabetes mellitus with diabetic neuropathic arthropathy: Secondary | ICD-10-CM | POA: Diagnosis not present

## 2016-10-04 DIAGNOSIS — J301 Allergic rhinitis due to pollen: Secondary | ICD-10-CM | POA: Diagnosis not present

## 2016-10-04 DIAGNOSIS — I10 Essential (primary) hypertension: Secondary | ICD-10-CM | POA: Diagnosis not present

## 2016-10-04 DIAGNOSIS — E119 Type 2 diabetes mellitus without complications: Secondary | ICD-10-CM | POA: Diagnosis not present

## 2016-10-04 DIAGNOSIS — E784 Other hyperlipidemia: Secondary | ICD-10-CM | POA: Diagnosis not present

## 2016-11-05 NOTE — Progress Notes (Signed)
Cardiology Office Note  Date: 11/08/2016   ID: Diana Patterson, Diana Patterson 1945-12-12, MRN [id number]  PCP: Neale Burly, MD  Primary Cardiologist: Rozann Lesches, MD   Chief Complaint  Patient presents with  . Coronary Artery Disease    History of Present Illness: Diana Patterson is a 71 y.o. female last seen in February. She presents with her husband for a routine visit. Reports no angina symptoms. Does not have nitroglycerin at home, we discussed getting a prescription to be used as needed.  She reports compliance with her medications. She has come off of Brilinta since intervention last June. Regimen now includes aspirin, Lipitor, try lytic 6, Lasix, Isordil, Cozaar, Lopressor, and potassium supplements.  I personally reviewed her ECG today which shows normal sinus rhythm with low voltage.  Lexiscan Myoview from last August showed no large ischemic defects with LVEF 54%, low risk study.  She continues to follow with Dr. Sherrie Sport, reports having had lipids done in the interim which we will request. She also follows with Dr. Lowanda Foster for renal insufficiency.  Past Medical History:  Diagnosis Date  . CAD (coronary artery disease)    DES to circumflex June 2017, moderate residual LAD disease  . CKD (chronic kidney disease) stage 3, GFR 30-59 ml/min    Dr. Lowanda Foster  . Essential hypertension, benign   . Mixed hyperlipidemia   . NSTEMI (non-ST elevated myocardial infarction) ([facility])    Probable type II event, negative Cardiolite 2012  . TIA (transient ischemic attack)   . Type 2 diabetes mellitus ([facility])     Past Surgical History:  Procedure Laterality Date  . CARDIAC CATHETERIZATION N/A 08/23/2015   Procedure: Left Heart Cath and Coronary Angiography;  Surgeon: Leonie Man, MD;  Location: [facility];  Service: Cardiovascular;  Laterality: N/A;  . CARDIAC CATHETERIZATION N/A 08/23/2015   Procedure: Coronary Stent Intervention;  Surgeon: Leonie Man, MD;   Location: [facility];  Service: Cardiovascular;  Laterality: N/A;  . Tubal pregnancy removal      Current Outpatient Prescriptions  Medication Sig Dispense Refill  . aspirin 81 MG tablet Take 81 mg by mouth daily.      [name] atorvastatin (LIPITOR) 80 MG tablet Take 1 tablet (80 mg total) by mouth daily at 6 PM. 30 tablet 5  . Calcium Carb-Cholecalciferol (CALCIUM 1000 + D) 1000-800 MG-UNIT TABS Take 1 tablet by mouth daily.     . Choline Fenofibrate (TRILIPIX) 135 MG capsule Take 135 mg by mouth daily.      [name] dexlansoprazole (DEXILANT) 60 MG capsule Take 1 capsule (60 mg total) by mouth daily. 90 capsule 3  . folic acid (FOLVITE) 1 MG tablet Take 1 mg by mouth See admin instructions. Pt takes every day except Saturday    . furosemide (LASIX) 20 MG tablet Take 20 mg by mouth daily.     . isosorbide dinitrate (ISORDIL) 30 MG tablet Take 1 tablet (30 mg total) by mouth daily. 30 tablet 5  . levocetirizine (XYZAL) 5 MG tablet Take 5 mg by mouth every evening.    [name] losartan (COZAAR) 25 MG tablet Take 25 mg by mouth daily.      . metaxalone (SKELAXIN) 800 MG tablet Take 800 mg by mouth 2 (two) times daily.     . metFORMIN (GLUCOPHAGE) 500 MG tablet Take 500 mg by mouth 2 (two) times daily with a meal.    . methotrexate (RHEUMATREX) 2.5 MG tablet Take 20 mg by  mouth once a week. 8 tabs once weekly.  Pt takes on Saturday    . metoprolol tartrate (LOPRESSOR) 50 MG tablet Take 1 tablet (50 mg total) by mouth 2 (two) times daily. 60 tablet 5  . Omega-3 Fatty Acids (FISH OIL) 1000 MG CAPS Take 1 capsule by mouth 2 (two) times daily.     [name] POLYSACCH FE COMPLEX-VIT D3 PO Take 1 tablet by mouth daily.    . Polysaccharide Iron Complex (POLY-IRON 150 PO) Take by mouth daily.    . potassium chloride SA (K-DUR,KLOR-CON) 20 MEQ tablet Take 20 mEq by mouth daily.    . valACYclovir (VALTREX) 500 MG tablet Take 500 mg by mouth daily.     No current facility-administered medications for this visit.     Allergies:  Morphine and related   Social History: The patient  reports that she quit smoking about 38 years ago. Her smoking use included Cigarettes. She has a 20.00 pack-year smoking history. She has never used smokeless tobacco. She reports that she does not drink alcohol or use drugs.   ROS:  Please see the history of present illness. Otherwise, complete review of systems is positive for arthritic pains.  All other systems are reviewed and negative.   Physical Exam: VS:  BP 118/62   Pulse 72   Ht 5\' 6"  (1.676 m)   Wt 223 lb (101.2 kg)   SpO2 97%   BMI 35.99 kg/m , BMI Body mass index is 35.99 kg/m.  Wt Readings from Last 3 Encounters:  11/08/16 223 lb (101.2 kg)  07/12/16 205 lb 4.8 oz (93.1 kg)  07/01/16 207 lb 11.2 oz (94.2 kg)    General: Obese woman, appears comfortable at rest. HEENT: Conjunctiva and lids normal, oropharynx clear.  Neck: Supple, no elevated JVP or carotid bruits, no thyromegaly.  Lungs: Clear to auscultation, nonlabored breathing at rest.  Cardiac: Regular rate and rhythm, no S3 or significant systolic murmur, no pericardial rub.  Abdomen: Soft, nontender,bowel sounds present, no guarding or rebound.  Extremities: No pitting edema, distal pulses 1-2+. Skin: Warm and dry. Musculoskeletal: No kyphosis. Neuropsychiatric: Alert and oriented 3, affect appropriate.  ECG: I personally reviewed the tracing from 09/10/2015 which showed sinus rhythm with low voltage and decreased R wave progression.  Recent Labwork: 07/01/2016: ALT 46; AST 56; Hemoglobin 9.7; Platelets 439 07/15/2016: Creatinine, Ser 1.50   Other Studies Reviewed Today:  Cardiac catheterization 08/23/2015:  Prox Cx to Mid Cx lesion, 90% stenosed. Post intervention aspiration thrombectomy followed by Promus Premier DES 3.5 mm x 12 mm (3.75 mm) , there is a 0% residual stenosis.  Prox LAD-2 lesion, 50-75% stenosed.  The left ventricular systolic function is normal.  Elevated  LVEDP  Likely culprit lesion was the thrombotic stenosis of the circumflex. It would appear that there was reperfusion after heparin and aspirin was given in the emergency room. This would be consistent with her improved chest pain upon arrival.  Successful PCI of the proximal dominant Circumflex. There is residual disease in the proximal-mid LAD prior to D1. This does not appear to be flow-limiting, however would probably benefit from being investigated. I would determine course of action based on her symptoms. If she is a symptomatically, could consider discharge with plans for outpatient Myoview to basket the LAD, otherwise if she has symptoms of angina following this PCI, I would proceed with staged PCI of the LAD prior to her discharge.  Echocardiogram 08/25/2015: Study Conclusions  - Left ventricle: The cavity size  was normal. Wall thickness was  increased in a pattern of mild LVH. Systolic function was normal.  The estimated ejection fraction was in the range of 60% to 65%.  Wall motion was normal; there were no regional wall motion  abnormalities. Doppler parameters are consistent with abnormal  left ventricular relaxation (grade 1 diastolic dysfunction). - Mitral valve: Mildly calcified annulus.  Lexiscan Myoview 11/11/2015:  No diagnostic ST segment changes to indicate ischemia.  Small, mild intensity, apical anterior defect that is predominantly fixed and most consistent with variable soft tissue attenuation. No large degree of anterior ischemia noted.  This is a low risk study.  Nuclear stress EF: 54%.  Assessment and Plan:  1. Symptomatically stable CAD status post DES to the circumflex in June 2017. She has moderate residual LAD disease but no anterior ischemia by follow-up Myoview. Continue with medical therapy, prescription for as needed nitroglycerin provided. Brilinta has been discontinued.  2. CKD stage III, last creatinine 1.5. Keep follow-up with Dr.  Lowanda Foster.  3. Hyperlipidemia, on Lipitor. Requesting most recent FLP from Dr. Sherrie Sport.  4. Essential hypertension, blood pressure is well controlled today on current regimen. No changes were made.  Current medicines were reviewed with the patient today.   Orders Placed This Encounter  Procedures  . EKG 12-Lead    Disposition: Follow-up in 6 months.  Signed, Satira Sark, MD, [facility] 11/08/2016 8:46 AM    [facility] at [place], [place], [place] [address] Phone: [phone]; Fax: [phone]

## 2016-11-08 ENCOUNTER — Encounter: Payer: Self-pay | Admitting: Cardiology

## 2016-11-08 ENCOUNTER — Ambulatory Visit (INDEPENDENT_AMBULATORY_CARE_PROVIDER_SITE_OTHER): Payer: Medicare Other | Admitting: Cardiology

## 2016-11-08 VITALS — BP 118/62 | HR 72 | Ht 66.0 in | Wt 223.0 lb

## 2016-11-08 DIAGNOSIS — N183 Chronic kidney disease, stage 3 unspecified: Secondary | ICD-10-CM

## 2016-11-08 DIAGNOSIS — I2510 Atherosclerotic heart disease of native coronary artery without angina pectoris: Secondary | ICD-10-CM

## 2016-11-08 DIAGNOSIS — E782 Mixed hyperlipidemia: Secondary | ICD-10-CM | POA: Diagnosis not present

## 2016-11-08 DIAGNOSIS — I10 Essential (primary) hypertension: Secondary | ICD-10-CM

## 2016-11-08 MED ORDER — NITROGLYCERIN 0.4 MG SL SUBL: 0.4000 mg | SUBLINGUAL_TABLET | SUBLINGUAL | 3 refills | Status: DC | PRN

## 2016-11-08 NOTE — Patient Instructions (Signed)

## 2016-11-10 ENCOUNTER — Other Ambulatory Visit: Payer: Self-pay | Admitting: Cardiology

## 2016-11-10 MED ORDER — NITROGLYCERIN 0.4 MG SL SUBL: 0.4000 mg | SUBLINGUAL_TABLET | SUBLINGUAL | 3 refills | Status: DC | PRN

## 2016-11-10 NOTE — Telephone Encounter (Signed)
Sent to [facility]

## 2016-11-10 NOTE — Telephone Encounter (Signed)
nitroGLYCERIN (NITROSTAT) 0.4 MG SL tablet   Please call in to [facility] was called into [place]

## 2016-12-14 DIAGNOSIS — Z23 Encounter for immunization: Secondary | ICD-10-CM | POA: Diagnosis not present

## 2016-12-21 DIAGNOSIS — I129 Hypertensive chronic kidney disease with stage 1 through stage 4 chronic kidney disease, or unspecified chronic kidney disease: Secondary | ICD-10-CM | POA: Diagnosis not present

## 2016-12-21 DIAGNOSIS — N183 Chronic kidney disease, stage 3 (moderate): Secondary | ICD-10-CM | POA: Diagnosis not present

## 2016-12-21 DIAGNOSIS — R809 Proteinuria, unspecified: Secondary | ICD-10-CM | POA: Diagnosis not present

## 2016-12-21 DIAGNOSIS — Z79899 Other long term (current) drug therapy: Secondary | ICD-10-CM | POA: Diagnosis not present

## 2016-12-21 DIAGNOSIS — D509 Iron deficiency anemia, unspecified: Secondary | ICD-10-CM | POA: Diagnosis not present

## 2016-12-21 DIAGNOSIS — E559 Vitamin D deficiency, unspecified: Secondary | ICD-10-CM | POA: Diagnosis not present

## 2016-12-29 DIAGNOSIS — R809 Proteinuria, unspecified: Secondary | ICD-10-CM | POA: Diagnosis not present

## 2016-12-29 DIAGNOSIS — N250 Renal osteodystrophy: Secondary | ICD-10-CM | POA: Diagnosis not present

## 2016-12-29 DIAGNOSIS — E1129 Type 2 diabetes mellitus with other diabetic kidney complication: Secondary | ICD-10-CM | POA: Diagnosis not present

## 2016-12-29 DIAGNOSIS — E876 Hypokalemia: Secondary | ICD-10-CM | POA: Diagnosis not present

## 2016-12-29 DIAGNOSIS — I10 Essential (primary) hypertension: Secondary | ICD-10-CM | POA: Diagnosis not present

## 2016-12-29 DIAGNOSIS — D649 Anemia, unspecified: Secondary | ICD-10-CM | POA: Diagnosis not present

## 2016-12-29 DIAGNOSIS — N183 Chronic kidney disease, stage 3 (moderate): Secondary | ICD-10-CM | POA: Diagnosis not present

## 2017-01-04 DIAGNOSIS — J301 Allergic rhinitis due to pollen: Secondary | ICD-10-CM | POA: Diagnosis not present

## 2017-01-04 DIAGNOSIS — M0589 Other rheumatoid arthritis with rheumatoid factor of multiple sites: Secondary | ICD-10-CM | POA: Diagnosis not present

## 2017-01-04 DIAGNOSIS — J302 Other seasonal allergic rhinitis: Secondary | ICD-10-CM | POA: Diagnosis not present

## 2017-01-04 DIAGNOSIS — E11610 Type 2 diabetes mellitus with diabetic neuropathic arthropathy: Secondary | ICD-10-CM | POA: Diagnosis not present

## 2017-01-04 DIAGNOSIS — E7849 Other hyperlipidemia: Secondary | ICD-10-CM | POA: Diagnosis not present

## 2017-01-04 DIAGNOSIS — I10 Essential (primary) hypertension: Secondary | ICD-10-CM | POA: Diagnosis not present

## 2017-01-20 DIAGNOSIS — E11610 Type 2 diabetes mellitus with diabetic neuropathic arthropathy: Secondary | ICD-10-CM | POA: Diagnosis not present

## 2017-01-20 DIAGNOSIS — E7849 Other hyperlipidemia: Secondary | ICD-10-CM | POA: Diagnosis not present

## 2017-01-20 DIAGNOSIS — Z01419 Encounter for gynecological examination (general) (routine) without abnormal findings: Secondary | ICD-10-CM | POA: Diagnosis not present

## 2017-01-20 DIAGNOSIS — I10 Essential (primary) hypertension: Secondary | ICD-10-CM | POA: Diagnosis not present

## 2017-01-20 DIAGNOSIS — Z1231 Encounter for screening mammogram for malignant neoplasm of breast: Secondary | ICD-10-CM | POA: Diagnosis not present

## 2017-01-20 DIAGNOSIS — K649 Unspecified hemorrhoids: Secondary | ICD-10-CM | POA: Diagnosis not present

## 2017-01-20 DIAGNOSIS — M0589 Other rheumatoid arthritis with rheumatoid factor of multiple sites: Secondary | ICD-10-CM | POA: Diagnosis not present

## 2017-01-25 DIAGNOSIS — M170 Bilateral primary osteoarthritis of knee: Secondary | ICD-10-CM | POA: Diagnosis not present

## 2017-01-25 DIAGNOSIS — Z96653 Presence of artificial knee joint, bilateral: Secondary | ICD-10-CM | POA: Diagnosis not present

## 2017-02-21 DIAGNOSIS — I10 Essential (primary) hypertension: Secondary | ICD-10-CM | POA: Diagnosis not present

## 2017-02-21 DIAGNOSIS — E11610 Type 2 diabetes mellitus with diabetic neuropathic arthropathy: Secondary | ICD-10-CM | POA: Diagnosis not present

## 2017-02-21 DIAGNOSIS — E7849 Other hyperlipidemia: Secondary | ICD-10-CM | POA: Diagnosis not present

## 2017-02-21 DIAGNOSIS — M0589 Other rheumatoid arthritis with rheumatoid factor of multiple sites: Secondary | ICD-10-CM | POA: Diagnosis not present

## 2017-03-03 DIAGNOSIS — G5601 Carpal tunnel syndrome, right upper limb: Secondary | ICD-10-CM | POA: Diagnosis not present

## 2017-03-03 DIAGNOSIS — E1140 Type 2 diabetes mellitus with diabetic neuropathy, unspecified: Secondary | ICD-10-CM | POA: Diagnosis not present

## 2017-03-03 DIAGNOSIS — R208 Other disturbances of skin sensation: Secondary | ICD-10-CM | POA: Diagnosis not present

## 2017-03-03 DIAGNOSIS — G5602 Carpal tunnel syndrome, left upper limb: Secondary | ICD-10-CM | POA: Diagnosis not present

## 2017-03-30 DIAGNOSIS — E11610 Type 2 diabetes mellitus with diabetic neuropathic arthropathy: Secondary | ICD-10-CM | POA: Diagnosis not present

## 2017-03-30 DIAGNOSIS — G5603 Carpal tunnel syndrome, bilateral upper limbs: Secondary | ICD-10-CM | POA: Diagnosis not present

## 2017-03-30 DIAGNOSIS — M0589 Other rheumatoid arthritis with rheumatoid factor of multiple sites: Secondary | ICD-10-CM | POA: Diagnosis not present

## 2017-03-30 DIAGNOSIS — I10 Essential (primary) hypertension: Secondary | ICD-10-CM | POA: Diagnosis not present

## 2017-03-30 DIAGNOSIS — E7849 Other hyperlipidemia: Secondary | ICD-10-CM | POA: Diagnosis not present

## 2017-03-30 DIAGNOSIS — J328 Other chronic sinusitis: Secondary | ICD-10-CM | POA: Diagnosis not present

## 2017-04-14 DIAGNOSIS — M810 Age-related osteoporosis without current pathological fracture: Secondary | ICD-10-CM | POA: Diagnosis not present

## 2017-04-14 DIAGNOSIS — E2839 Other primary ovarian failure: Secondary | ICD-10-CM | POA: Diagnosis not present

## 2017-04-16 DIAGNOSIS — M1990 Unspecified osteoarthritis, unspecified site: Secondary | ICD-10-CM | POA: Diagnosis not present

## 2017-04-16 DIAGNOSIS — I10 Essential (primary) hypertension: Secondary | ICD-10-CM | POA: Diagnosis not present

## 2017-04-16 DIAGNOSIS — Z7982 Long term (current) use of aspirin: Secondary | ICD-10-CM | POA: Diagnosis not present

## 2017-04-16 DIAGNOSIS — J209 Acute bronchitis, unspecified: Secondary | ICD-10-CM | POA: Diagnosis not present

## 2017-04-16 DIAGNOSIS — Z87891 Personal history of nicotine dependence: Secondary | ICD-10-CM | POA: Diagnosis not present

## 2017-04-16 DIAGNOSIS — Z79899 Other long term (current) drug therapy: Secondary | ICD-10-CM | POA: Diagnosis not present

## 2017-04-16 DIAGNOSIS — Z7984 Long term (current) use of oral hypoglycemic drugs: Secondary | ICD-10-CM | POA: Diagnosis not present

## 2017-04-16 DIAGNOSIS — J019 Acute sinusitis, unspecified: Secondary | ICD-10-CM | POA: Diagnosis not present

## 2017-04-16 DIAGNOSIS — E785 Hyperlipidemia, unspecified: Secondary | ICD-10-CM | POA: Diagnosis not present

## 2017-05-02 DIAGNOSIS — J040 Acute laryngitis: Secondary | ICD-10-CM | POA: Diagnosis not present

## 2017-05-18 DIAGNOSIS — I10 Essential (primary) hypertension: Secondary | ICD-10-CM | POA: Diagnosis not present

## 2017-05-18 DIAGNOSIS — E1169 Type 2 diabetes mellitus with other specified complication: Secondary | ICD-10-CM | POA: Diagnosis not present

## 2017-05-18 DIAGNOSIS — I2109 ST elevation (STEMI) myocardial infarction involving other coronary artery of anterior wall: Secondary | ICD-10-CM | POA: Diagnosis not present

## 2017-06-15 DIAGNOSIS — E11610 Type 2 diabetes mellitus with diabetic neuropathic arthropathy: Secondary | ICD-10-CM | POA: Diagnosis not present

## 2017-06-15 DIAGNOSIS — I10 Essential (primary) hypertension: Secondary | ICD-10-CM | POA: Diagnosis not present

## 2017-06-15 DIAGNOSIS — G4733 Obstructive sleep apnea (adult) (pediatric): Secondary | ICD-10-CM | POA: Diagnosis not present

## 2017-06-22 DIAGNOSIS — R809 Proteinuria, unspecified: Secondary | ICD-10-CM | POA: Diagnosis not present

## 2017-06-22 DIAGNOSIS — Z79899 Other long term (current) drug therapy: Secondary | ICD-10-CM | POA: Diagnosis not present

## 2017-06-22 DIAGNOSIS — D509 Iron deficiency anemia, unspecified: Secondary | ICD-10-CM | POA: Diagnosis not present

## 2017-06-22 DIAGNOSIS — I129 Hypertensive chronic kidney disease with stage 1 through stage 4 chronic kidney disease, or unspecified chronic kidney disease: Secondary | ICD-10-CM | POA: Diagnosis not present

## 2017-06-22 DIAGNOSIS — N183 Chronic kidney disease, stage 3 (moderate): Secondary | ICD-10-CM | POA: Diagnosis not present

## 2017-06-22 DIAGNOSIS — E559 Vitamin D deficiency, unspecified: Secondary | ICD-10-CM | POA: Diagnosis not present

## 2017-06-29 DIAGNOSIS — E669 Obesity, unspecified: Secondary | ICD-10-CM | POA: Diagnosis not present

## 2017-06-29 DIAGNOSIS — N183 Chronic kidney disease, stage 3 (moderate): Secondary | ICD-10-CM | POA: Diagnosis not present

## 2017-06-29 DIAGNOSIS — E559 Vitamin D deficiency, unspecified: Secondary | ICD-10-CM | POA: Diagnosis not present

## 2017-06-30 DIAGNOSIS — E119 Type 2 diabetes mellitus without complications: Secondary | ICD-10-CM | POA: Diagnosis not present

## 2017-06-30 DIAGNOSIS — I10 Essential (primary) hypertension: Secondary | ICD-10-CM | POA: Diagnosis not present

## 2017-06-30 DIAGNOSIS — I2584 Coronary atherosclerosis due to calcified coronary lesion: Secondary | ICD-10-CM | POA: Diagnosis not present

## 2017-06-30 DIAGNOSIS — G4733 Obstructive sleep apnea (adult) (pediatric): Secondary | ICD-10-CM | POA: Diagnosis not present

## 2017-07-11 DIAGNOSIS — E119 Type 2 diabetes mellitus without complications: Secondary | ICD-10-CM | POA: Diagnosis not present

## 2017-07-11 DIAGNOSIS — I10 Essential (primary) hypertension: Secondary | ICD-10-CM | POA: Diagnosis not present

## 2017-07-11 DIAGNOSIS — G4733 Obstructive sleep apnea (adult) (pediatric): Secondary | ICD-10-CM | POA: Diagnosis not present

## 2017-07-11 NOTE — Progress Notes (Signed)
Cardiology Office Note  Date: 07/12/2017   ID: Diana Patterson, DOB May 13, 1945, MRN [id number]  PCP: Neale Burly, MD  Primary Cardiologist: Rozann Lesches, MD   Chief Complaint  Patient presents with  . Coronary Artery Disease    History of Present Illness: Diana Patterson is a 72 y.o. female last seen in August 2018.  She presents with her husband for a routine follow-up visit.  Since last encounter she does not report any significant chest pain or nitroglycerin use.  She reports NYHA class II dyspnea with typical activities.  She had a visit with Dr. Sherrie Sport recently and had blood work which we are requesting for review.  I reviewed her cardiac medications which are outlined below.  She was taken off Cozaar by Dr. Sherrie Sport.  Blood pressure today is normal.  Lexiscan Myoview from August 2017 revealed anterior variable soft tissue attenuation with LVEF 54%.  Past Medical History:  Diagnosis Date  . CAD (coronary artery disease)    DES to circumflex June 2017, moderate residual LAD disease  . CKD (chronic kidney disease) stage 3, GFR 30-59 ml/min ([facility])    Dr. Lowanda Foster  . Essential hypertension, benign   . Mixed hyperlipidemia   . NSTEMI (non-ST elevated myocardial infarction) ([place])    Probable type II event, negative Cardiolite 2012  . TIA (transient ischemic attack)   . Type 2 diabetes mellitus ([place])     Past Surgical History:  Procedure Laterality Date  . CARDIAC CATHETERIZATION N/A 08/23/2015   Procedure: Left Heart Cath and Coronary Angiography;  Surgeon: Leonie Man, MD;  Location: [facility];  Service: Cardiovascular;  Laterality: N/A;  . CARDIAC CATHETERIZATION N/A 08/23/2015   Procedure: Coronary Stent Intervention;  Surgeon: Leonie Man, MD;  Location: [facility];  Service: Cardiovascular;  Laterality: N/A;  . Tubal pregnancy removal      Current Outpatient Medications  Medication Sig Dispense Refill  . aspirin 81 MG  tablet Take 81 mg by mouth daily.      [name] atorvastatin (LIPITOR) 80 MG tablet Take 1 tablet (80 mg total) by mouth daily at 6 PM. 30 tablet 5  . Calcium Carb-Cholecalciferol (CALCIUM 1000 + D) 1000-800 MG-UNIT TABS Take 1 tablet by mouth daily.     . Choline Fenofibrate (TRILIPIX) 135 MG capsule Take 135 mg by mouth daily.      [name] dexlansoprazole (DEXILANT) 60 MG capsule Take 1 capsule (60 mg total) by mouth daily. 90 capsule 3  . folic acid (FOLVITE) 1 MG tablet Take 1 mg by mouth See admin instructions. Pt takes every day except Saturday    . furosemide (LASIX) 20 MG tablet Take 20 mg by mouth daily.     . isosorbide dinitrate (ISORDIL) 30 MG tablet Take 1 tablet (30 mg total) by mouth daily. 30 tablet 5  . metaxalone (SKELAXIN) 800 MG tablet Take 800 mg by mouth 2 (two) times daily.     . metFORMIN (GLUCOPHAGE) 500 MG tablet Take 500 mg by mouth 2 (two) times daily with a meal.    . methotrexate (RHEUMATREX) 2.5 MG tablet Take 20 mg by mouth once a week. 8 tabs once weekly.  Pt takes on Saturday    . metoprolol tartrate (LOPRESSOR) 50 MG tablet Take 1 tablet (50 mg total) by mouth 2 (two) times daily. 60 tablet 5  . nitroGLYCERIN (NITROSTAT) 0.4 MG SL tablet Place 1 tablet (0.4 mg total) under the tongue every 5 (five)  minutes as needed for chest pain. 25 tablet 3  . Omega-3 Fatty Acids (FISH OIL) 1000 MG CAPS Take 1 capsule by mouth 2 (two) times daily.     [name] POLYSACCH FE COMPLEX-VIT D3 PO Take 1 tablet by mouth daily.    . Polysaccharide Iron Complex (POLY-IRON 150 PO) Take by mouth daily.    . potassium chloride SA (K-DUR,KLOR-CON) 20 MEQ tablet Take 20 mEq by mouth daily.    . valACYclovir (VALTREX) 500 MG tablet Take 500 mg by mouth daily.     No current facility-administered medications for this visit.    Allergies:  Morphine and related   Social History: The patient  reports that she quit smoking about 39 years ago. Her smoking use included cigarettes. She has a 20.00 pack-year smoking  history. She has never used smokeless tobacco. She reports that she does not drink alcohol or use drugs.   ROS:  Please see the history of present illness. Otherwise, complete review of systems is positive for none.  All other systems are reviewed and negative.   Physical Exam: VS:  BP 120/70   Pulse 76   Ht 5\' 6"  (1.676 m)   Wt 221 lb (100.2 kg)   BMI 35.67 kg/m , BMI Body mass index is 35.67 kg/m.  Wt Readings from Last 3 Encounters:  07/12/17 221 lb (100.2 kg)  11/08/16 223 lb (101.2 kg)  07/12/16 205 lb 4.8 oz (93.1 kg)    General: Patient appears comfortable at rest. HEENT: Conjunctiva and lids normal, oropharynx clear. Neck: Supple, no elevated JVP or carotid bruits, no thyromegaly. Lungs: Clear to auscultation, nonlabored breathing at rest. Cardiac: Regular rate and rhythm, no S3 or significant systolic murmur, no pericardial rub. Abdomen: Soft, nontender, bowel sounds present. Extremities: No pitting edema, distal pulses 2+. Skin: Warm and dry. Musculoskeletal: No kyphosis. Neuropsychiatric: Alert and oriented x3, affect grossly appropriate.  ECG: I personally reviewed the tracing from 11/08/2016 which showed sinus rhythm low voltage.  Recent Labwork: 07/15/2016: Creatinine, Ser 1.50  July 2018: Cholesterol 102, triglycerides 136, HDL 30, LDL 45, hemoglobin A1c 5.3, BUN 28, creatinine 1.21, potassium 4.6  Other Studies Reviewed Today:  Cardiac catheterization 08/23/2015:  Prox Cx to Mid Cx lesion, 90% stenosed. Post intervention aspiration thrombectomy followed by Promus Premier DES 3.5 mm x 12 mm (3.75 mm) , there is a 0% residual stenosis.  Prox LAD-2 lesion, 50-75% stenosed.  The left ventricular systolic function is normal.  Elevated LVEDP  Likely culprit lesion was the thrombotic stenosis of the circumflex. It would appear that there was reperfusion after heparin and aspirin was given in the emergency room. This would be consistent with her improved chest pain  upon arrival.  Successful PCI of the proximal dominant Circumflex. There is residual disease in the proximal-mid LAD prior to D1. This does not appear to be flow-limiting, however would probably benefit from being investigated. I would determine course of action based on her symptoms. If she is a symptomatically, could consider discharge with plans for outpatient Myoview to basket the LAD, otherwise if she has symptoms of angina following this PCI, I would proceed with staged PCI of the LAD prior to her discharge.  Echocardiogram 08/25/2015: Study Conclusions  - Left ventricle: The cavity size was normal. Wall thickness was  increased in a pattern of mild LVH. Systolic function was normal.  The estimated ejection fraction was in the range of 60% to 65%.  Wall motion was normal; there were no regional wall  motion  abnormalities. Doppler parameters are consistent with abnormal  left ventricular relaxation (grade 1 diastolic dysfunction). - Mitral valve: Mildly calcified annulus.  LexiscanMyoview 11/11/2015:  No diagnostic ST segment changes to indicate ischemia.  Small, mild intensity, apical anterior defect that is predominantly fixed and most consistent with variable soft tissue attenuation. No large degree of anterior ischemia noted.  This is a low risk study.  Nuclear stress EF: 54%.  Assessment and Plan:  1.  CAD status post DES to the circumflex in June 2017.  She has moderate residual LAD disease but no active angina on medical therapy.  Continue observation.  2.  CKD stage 3, she follows with Dr. Lowanda Foster.  3.  Mixed hyperlipidemia.  Continues on Lipitor.  Requesting lab work from Dr. Sherrie Sport.  4.  Essential hypertension, now off Cozaar.  Blood pressure is normal today.  Current medicines were reviewed with the patient today.  Disposition: Follow-up in 6 months.  Signed, Satira Sark, MD, [facility] 07/12/2017 1:11 PM    [facility] at  [place], [place], [place] [address] Phone: [phone]; Fax: [phone]

## 2017-07-12 ENCOUNTER — Ambulatory Visit (INDEPENDENT_AMBULATORY_CARE_PROVIDER_SITE_OTHER): Payer: Medicare Other | Admitting: Cardiology

## 2017-07-12 ENCOUNTER — Encounter: Payer: Self-pay | Admitting: Cardiology

## 2017-07-12 VITALS — BP 120/70 | HR 76 | Ht 66.0 in | Wt 221.0 lb

## 2017-07-12 DIAGNOSIS — I10 Essential (primary) hypertension: Secondary | ICD-10-CM | POA: Diagnosis not present

## 2017-07-12 DIAGNOSIS — N183 Chronic kidney disease, stage 3 unspecified: Secondary | ICD-10-CM

## 2017-07-12 DIAGNOSIS — I25119 Atherosclerotic heart disease of native coronary artery with unspecified angina pectoris: Secondary | ICD-10-CM

## 2017-07-12 DIAGNOSIS — E782 Mixed hyperlipidemia: Secondary | ICD-10-CM | POA: Diagnosis not present

## 2017-07-12 NOTE — Patient Instructions (Signed)
Medication Instructions:  Your physician recommends that you continue on your current medications as directed. Please refer to the Current Medication list given to you today.   Labwork: I will request labs form pcp   Testing/Procedures: none  Follow-Up: Your physician wants you to follow-up in: 6 months.  You will receive a reminder letter in the mail two months in advance. If you don't receive a letter, please call our office to schedule the follow-up appointment.   Any Other Special Instructions Will Be Listed Below (If Applicable).     If you need a refill on your cardiac medications before your next appointment, please call your pharmacy.   

## 2017-07-15 ENCOUNTER — Encounter: Payer: Self-pay | Admitting: Cardiology

## 2017-08-17 DIAGNOSIS — I10 Essential (primary) hypertension: Secondary | ICD-10-CM | POA: Diagnosis not present

## 2017-08-17 DIAGNOSIS — G4733 Obstructive sleep apnea (adult) (pediatric): Secondary | ICD-10-CM | POA: Diagnosis not present

## 2017-08-17 DIAGNOSIS — E119 Type 2 diabetes mellitus without complications: Secondary | ICD-10-CM | POA: Diagnosis not present

## 2017-08-30 DIAGNOSIS — E119 Type 2 diabetes mellitus without complications: Secondary | ICD-10-CM | POA: Diagnosis not present

## 2017-08-30 DIAGNOSIS — I10 Essential (primary) hypertension: Secondary | ICD-10-CM | POA: Diagnosis not present

## 2017-08-30 DIAGNOSIS — G4733 Obstructive sleep apnea (adult) (pediatric): Secondary | ICD-10-CM | POA: Diagnosis not present

## 2017-10-10 DIAGNOSIS — I2584 Coronary atherosclerosis due to calcified coronary lesion: Secondary | ICD-10-CM | POA: Diagnosis not present

## 2017-10-10 DIAGNOSIS — Z0000 Encounter for general adult medical examination without abnormal findings: Secondary | ICD-10-CM | POA: Diagnosis not present

## 2017-10-10 DIAGNOSIS — I10 Essential (primary) hypertension: Secondary | ICD-10-CM | POA: Diagnosis not present

## 2017-10-10 DIAGNOSIS — Z1389 Encounter for screening for other disorder: Secondary | ICD-10-CM | POA: Diagnosis not present

## 2017-10-10 DIAGNOSIS — G4733 Obstructive sleep apnea (adult) (pediatric): Secondary | ICD-10-CM | POA: Diagnosis not present

## 2017-10-10 DIAGNOSIS — E119 Type 2 diabetes mellitus without complications: Secondary | ICD-10-CM | POA: Diagnosis not present

## 2017-11-07 DIAGNOSIS — Z885 Allergy status to narcotic agent status: Secondary | ICD-10-CM | POA: Diagnosis not present

## 2017-11-07 DIAGNOSIS — R1110 Vomiting, unspecified: Secondary | ICD-10-CM | POA: Diagnosis not present

## 2017-11-07 DIAGNOSIS — N10 Acute tubulo-interstitial nephritis: Secondary | ICD-10-CM | POA: Diagnosis not present

## 2017-11-07 DIAGNOSIS — Z79899 Other long term (current) drug therapy: Secondary | ICD-10-CM | POA: Diagnosis not present

## 2017-11-07 DIAGNOSIS — E119 Type 2 diabetes mellitus without complications: Secondary | ICD-10-CM | POA: Diagnosis not present

## 2017-11-07 DIAGNOSIS — I252 Old myocardial infarction: Secondary | ICD-10-CM | POA: Diagnosis not present

## 2017-11-07 DIAGNOSIS — N390 Urinary tract infection, site not specified: Secondary | ICD-10-CM | POA: Diagnosis not present

## 2017-11-07 DIAGNOSIS — Z7984 Long term (current) use of oral hypoglycemic drugs: Secondary | ICD-10-CM | POA: Diagnosis not present

## 2017-11-07 DIAGNOSIS — E7849 Other hyperlipidemia: Secondary | ICD-10-CM | POA: Diagnosis not present

## 2017-11-07 DIAGNOSIS — B961 Klebsiella pneumoniae [K. pneumoniae] as the cause of diseases classified elsewhere: Secondary | ICD-10-CM | POA: Diagnosis not present

## 2017-11-07 DIAGNOSIS — Z780 Asymptomatic menopausal state: Secondary | ICD-10-CM | POA: Diagnosis not present

## 2017-11-07 DIAGNOSIS — E785 Hyperlipidemia, unspecified: Secondary | ICD-10-CM | POA: Diagnosis not present

## 2017-11-07 DIAGNOSIS — R6883 Chills (without fever): Secondary | ICD-10-CM | POA: Diagnosis not present

## 2017-11-07 DIAGNOSIS — I10 Essential (primary) hypertension: Secondary | ICD-10-CM | POA: Diagnosis present

## 2017-11-07 DIAGNOSIS — M069 Rheumatoid arthritis, unspecified: Secondary | ICD-10-CM | POA: Diagnosis present

## 2017-11-07 DIAGNOSIS — N12 Tubulo-interstitial nephritis, not specified as acute or chronic: Secondary | ICD-10-CM | POA: Diagnosis not present

## 2017-11-07 DIAGNOSIS — M1990 Unspecified osteoarthritis, unspecified site: Secondary | ICD-10-CM | POA: Diagnosis present

## 2017-11-07 DIAGNOSIS — Z87891 Personal history of nicotine dependence: Secondary | ICD-10-CM | POA: Diagnosis not present

## 2017-11-07 DIAGNOSIS — R112 Nausea with vomiting, unspecified: Secondary | ICD-10-CM | POA: Diagnosis not present

## 2017-11-07 DIAGNOSIS — Z7982 Long term (current) use of aspirin: Secondary | ICD-10-CM | POA: Diagnosis not present

## 2017-11-25 DIAGNOSIS — Z6835 Body mass index (BMI) 35.0-35.9, adult: Secondary | ICD-10-CM | POA: Diagnosis not present

## 2017-11-25 DIAGNOSIS — N10 Acute tubulo-interstitial nephritis: Secondary | ICD-10-CM | POA: Diagnosis not present

## 2017-12-29 DIAGNOSIS — R809 Proteinuria, unspecified: Secondary | ICD-10-CM | POA: Diagnosis not present

## 2017-12-29 DIAGNOSIS — N183 Chronic kidney disease, stage 3 (moderate): Secondary | ICD-10-CM | POA: Diagnosis not present

## 2017-12-29 DIAGNOSIS — I129 Hypertensive chronic kidney disease with stage 1 through stage 4 chronic kidney disease, or unspecified chronic kidney disease: Secondary | ICD-10-CM | POA: Diagnosis not present

## 2017-12-29 DIAGNOSIS — D509 Iron deficiency anemia, unspecified: Secondary | ICD-10-CM | POA: Diagnosis not present

## 2017-12-29 DIAGNOSIS — Z79899 Other long term (current) drug therapy: Secondary | ICD-10-CM | POA: Diagnosis not present

## 2017-12-29 DIAGNOSIS — E559 Vitamin D deficiency, unspecified: Secondary | ICD-10-CM | POA: Diagnosis not present

## 2017-12-31 DIAGNOSIS — Z23 Encounter for immunization: Secondary | ICD-10-CM | POA: Diagnosis not present

## 2018-01-04 DIAGNOSIS — D638 Anemia in other chronic diseases classified elsewhere: Secondary | ICD-10-CM | POA: Diagnosis not present

## 2018-01-04 DIAGNOSIS — E559 Vitamin D deficiency, unspecified: Secondary | ICD-10-CM | POA: Diagnosis not present

## 2018-01-04 DIAGNOSIS — N183 Chronic kidney disease, stage 3 (moderate): Secondary | ICD-10-CM | POA: Diagnosis not present

## 2018-01-17 DIAGNOSIS — E119 Type 2 diabetes mellitus without complications: Secondary | ICD-10-CM | POA: Diagnosis not present

## 2018-01-17 DIAGNOSIS — K210 Gastro-esophageal reflux disease with esophagitis: Secondary | ICD-10-CM | POA: Diagnosis not present

## 2018-01-17 DIAGNOSIS — E782 Mixed hyperlipidemia: Secondary | ICD-10-CM | POA: Diagnosis not present

## 2018-01-17 DIAGNOSIS — I10 Essential (primary) hypertension: Secondary | ICD-10-CM | POA: Diagnosis not present

## 2018-01-19 ENCOUNTER — Ambulatory Visit: Payer: Medicare Other | Admitting: Cardiology

## 2018-01-26 DIAGNOSIS — E782 Mixed hyperlipidemia: Secondary | ICD-10-CM | POA: Diagnosis not present

## 2018-01-26 DIAGNOSIS — I10 Essential (primary) hypertension: Secondary | ICD-10-CM | POA: Diagnosis not present

## 2018-01-26 DIAGNOSIS — K210 Gastro-esophageal reflux disease with esophagitis: Secondary | ICD-10-CM | POA: Diagnosis not present

## 2018-01-26 DIAGNOSIS — E119 Type 2 diabetes mellitus without complications: Secondary | ICD-10-CM | POA: Diagnosis not present

## 2018-02-03 IMAGING — NM NM MYOCAR MULTI W/SPECT W/WALL MOTION & EF
2 series · 12 of 12 positions shown · non-contrast
Comparison: none

[Series 1: rest · 8.28mm/px · 6 of 64 frames shown]
[frame 6/64]
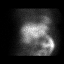
[frame 16/64]
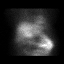
[frame 27/64]
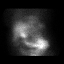
[frame 38/64]
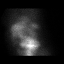
[frame 48/64]
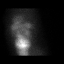
[frame 59/64]
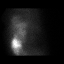

[Series 2: stress gated · 8.28mm/px · 6 of 64 frames shown]
[frame 6/64]
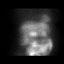
[frame 16/64]
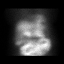
[frame 27/64]
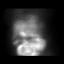
[frame 38/64]
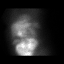
[frame 48/64]
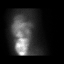
[frame 59/64]
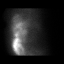

[12 of 12 positions shown; findings below may reference images not displayed]

Canned report from images found in remote index.

Refer to host system for actual result text.

## 2018-02-06 DIAGNOSIS — B373 Candidiasis of vulva and vagina: Secondary | ICD-10-CM | POA: Diagnosis not present

## 2018-02-06 DIAGNOSIS — Z1231 Encounter for screening mammogram for malignant neoplasm of breast: Secondary | ICD-10-CM | POA: Diagnosis not present

## 2018-02-10 DIAGNOSIS — R42 Dizziness and giddiness: Secondary | ICD-10-CM | POA: Diagnosis not present

## 2018-02-10 DIAGNOSIS — H8112 Benign paroxysmal vertigo, left ear: Secondary | ICD-10-CM | POA: Diagnosis not present

## 2018-02-10 DIAGNOSIS — H9193 Unspecified hearing loss, bilateral: Secondary | ICD-10-CM | POA: Diagnosis not present

## 2018-02-22 DIAGNOSIS — H6121 Impacted cerumen, right ear: Secondary | ICD-10-CM | POA: Diagnosis not present

## 2018-03-06 ENCOUNTER — Encounter: Payer: Self-pay | Admitting: Cardiology

## 2018-03-06 ENCOUNTER — Encounter

## 2018-03-06 ENCOUNTER — Ambulatory Visit (INDEPENDENT_AMBULATORY_CARE_PROVIDER_SITE_OTHER): Payer: Medicare Other | Admitting: Cardiology

## 2018-03-06 VITALS — BP 122/64 | HR 66 | Ht 66.0 in | Wt 217.0 lb

## 2018-03-06 DIAGNOSIS — I25119 Atherosclerotic heart disease of native coronary artery with unspecified angina pectoris: Secondary | ICD-10-CM | POA: Diagnosis not present

## 2018-03-06 DIAGNOSIS — I10 Essential (primary) hypertension: Secondary | ICD-10-CM

## 2018-03-06 DIAGNOSIS — N183 Chronic kidney disease, stage 3 unspecified: Secondary | ICD-10-CM

## 2018-03-06 DIAGNOSIS — E782 Mixed hyperlipidemia: Secondary | ICD-10-CM | POA: Diagnosis not present

## 2018-03-06 NOTE — Progress Notes (Signed)
Cardiology Office Note  Date: 03/06/2018   ID: Diana Patterson, DOB Apr 04, 1945, MRN [id number]  PCP: Neale Burly, MD  Primary Cardiologist: Rozann Lesches, MD   Chief Complaint  Patient presents with  . Coronary Artery Disease    History of Present Illness: Diana Patterson is a 72 y.o. female last seen in April.  She is here today for a routine visit.  She has had no angina symptoms or nitroglycerin use and states that she is compliant with her cardiac medications.  She continues to follow with Dr. Sherrie Sport.  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  I personally reviewed her ECG today which shows normal sinus rhythm with low voltage in the precordial leads.  Past Medical History:  Diagnosis Date  . CAD (coronary artery disease)    DES to circumflex June 2017, moderate residual LAD disease  . CKD (chronic kidney disease) stage 3, GFR 30-59 ml/min (HCC)    Dr. Lowanda Foster  . Essential hypertension, benign   . Mixed hyperlipidemia   . NSTEMI (non-ST elevated myocardial infarction) ([place])    Probable type II event, negative Cardiolite 2012  . TIA (transient ischemic attack)   . Type 2 diabetes mellitus ([place])     Past Surgical History:  Procedure Laterality Date  . CARDIAC CATHETERIZATION N/A 08/23/2015   Procedure: Left Heart Cath and Coronary Angiography;  Surgeon: Leonie Man, MD;  Location: [facility];  Service: Cardiovascular;  Laterality: N/A;  . CARDIAC CATHETERIZATION N/A 08/23/2015   Procedure: Coronary Stent Intervention;  Surgeon: Leonie Man, MD;  Location: [facility];  Service: Cardiovascular;  Laterality: N/A;  . Tubal pregnancy removal      Current Outpatient Medications  Medication Sig Dispense Refill  . aspirin 81 MG tablet Take 81 mg by mouth daily.      [name] atorvastatin (LIPITOR) 80 MG tablet Take 1 tablet (80 mg total) by mouth daily at 6 PM. 30 tablet 5    . Calcium Carb-Cholecalciferol (CALCIUM 1000 + D) 1000-800 MG-UNIT TABS Take 1 tablet by mouth daily.     . Choline Fenofibrate (TRILIPIX) 135 MG capsule Take 135 mg by mouth daily.      [name] dexlansoprazole (DEXILANT) 60 MG capsule Take 1 capsule (60 mg total) by mouth daily. 90 capsule 3  . folic acid (FOLVITE) 1 MG tablet Take 1 mg by mouth See admin instructions. Pt takes every day except Saturday    . furosemide (LASIX) 20 MG tablet Take 20 mg by mouth daily.     . isosorbide dinitrate (ISORDIL) 30 MG tablet Take 1 tablet (30 mg total) by mouth daily. 30 tablet 5  . levocetirizine (XYZAL) 5 MG tablet Take 5 mg by mouth every evening.    . metaxalone (SKELAXIN) 800 MG tablet Take 800 mg by mouth 2 (two) times daily.     . metFORMIN (GLUCOPHAGE) 500 MG tablet Take 500 mg by mouth 2 (two) times daily with a meal.    . methotrexate (RHEUMATREX) 2.5 MG tablet Take 20 mg by mouth once a week. 8 tabs once weekly.  Pt takes on Saturday    . metoprolol tartrate (LOPRESSOR) 50 MG tablet Take 1 tablet (50 mg total) by mouth 2 (two) times daily. 60 tablet 5  . nitroGLYCERIN (NITROSTAT) 0.4 MG SL tablet Place 1 tablet (0.4 mg total) under the tongue every 5 (five) minutes as needed  for chest pain. 25 tablet 3  . Omega-3 Fatty Acids (FISH OIL) 1000 MG CAPS Take 1 capsule by mouth 2 (two) times daily.     [name] POLYSACCH FE COMPLEX-VIT D3 PO Take 1 tablet by mouth daily.    . Polysaccharide Iron Complex (POLY-IRON 150 PO) Take by mouth daily.    . potassium chloride SA (K-DUR,KLOR-CON) 20 MEQ tablet Take 20 mEq by mouth daily.    . valACYclovir (VALTREX) 500 MG tablet Take 500 mg by mouth daily.     No current facility-administered medications for this visit.    Allergies:  Morphine and related   Social History: The patient  reports that she quit smoking about 39 years ago. Her smoking use included cigarettes. She has a 20.00 pack-year smoking history. She has never used smokeless tobacco. She reports that  she does not drink alcohol or use drugs.   ROS:  Please see the history of present illness. Otherwise, complete review of systems is positive for back strain.  All other systems are reviewed and negative.   Physical Exam: VS:  BP 122/64   Pulse 66   Ht 5\' 6"  (1.676 m)   Wt 217 lb (98.4 kg)   SpO2 95%   BMI 35.02 kg/m , BMI Body mass index is 35.02 kg/m.  Wt Readings from Last 3 Encounters:  03/06/18 217 lb (98.4 kg)  07/12/17 221 lb (100.2 kg)  11/08/16 223 lb (101.2 kg)    General: Patient appears comfortable at rest. HEENT: Conjunctiva and lids normal, oropharynx clear. Neck: Supple, no elevated JVP or carotid bruits, no thyromegaly. Lungs: Clear to auscultation, nonlabored breathing at rest. Cardiac: Regular rate and rhythm, no S3 or significant systolic murmur. Abdomen: Soft, nontender, bowel sounds present. Extremities: No pitting edema, distal pulses 2+. Skin: Warm and dry. Musculoskeletal: No kyphosis. Neuropsychiatric: Alert and oriented x3, affect grossly appropriate.  ECG: I personally reviewed the tracing from 11/08/2016 which showed sinus rhythm with low voltage.  Recent Labwork:  October 2018: Cholesterol 100, triglycerides 132, HDL 31, LDL 43, hemoglobin A1c 5.6, BUN 20, creatinine 1.23, potassium 4.3  Other Studies Reviewed Today:  Cardiac catheterization 08/23/2015:  Prox Cx to Mid Cx lesion, 90% stenosed. Post intervention aspiration thrombectomy followed by Promus Premier DES 3.5 mm x 12 mm (3.75 mm) , there is a 0% residual stenosis.  Prox LAD-2 lesion, 50-75% stenosed.  The left ventricular systolic function is normal.  Elevated LVEDP  Likely culprit lesion was the thrombotic stenosis of the circumflex. It would appear that there was reperfusion after heparin and aspirin was given in the emergency room. This would be consistent with her improved chest pain upon arrival.  Successful PCI of the proximal dominant Circumflex. There is residual disease  in the proximal-mid LAD prior to D1. This does not appear to be flow-limiting, however would probably benefit from being investigated. I would determine course of action based on her symptoms. If she is a symptomatically, could consider discharge with plans for outpatient Myoview to basket the LAD, otherwise if she has symptoms of angina following this PCI, I would proceed with staged PCI of the LAD prior to her discharge.  Echocardiogram 08/25/2015: Study Conclusions  - Left ventricle: The cavity size was normal. Wall thickness was  increased in a pattern of mild LVH. Systolic function was normal.  The estimated ejection fraction was in the range of 60% to 65%.  Wall motion was normal; there were no regional wall motion  abnormalities. Doppler parameters are  consistent with abnormal  left ventricular relaxation (grade 1 diastolic dysfunction). - Mitral valve: Mildly calcified annulus.  LexiscanMyoview 11/11/2015:  No diagnostic ST segment changes to indicate ischemia.  Small, mild intensity, apical anterior defect that is predominantly fixed and most consistent with variable soft tissue attenuation. No large degree of anterior ischemia noted.  This is a low risk study.  Nuclear stress EF: 54%.  Assessment and Plan:  1.  CAD status post DES to the circumflex in June 2017 with moderate residual LAD disease to be managed medically.  She remains stable on medical therapy and we will continue with current plan.  2.  Mixed hyperlipidemia on Lipitor.  She follows with Dr. Sherrie Sport.  3.  Essential hypertension, blood pressure is well controlled today.  No changes made in current regimen.  4.  CKD stage III, follows with Dr. Lowanda Foster.  Current medicines were reviewed with the patient today.   Orders Placed This Encounter  Procedures  . EKG 12-Lead    Disposition: Follow-up in 6 months.  Signed, Satira Sark, MD, [facility] 03/06/2018 9:44 AM    [facility] [place], [place], [place] [address] Phone: [phone]; Fax: [phone]

## 2018-03-06 NOTE — Patient Instructions (Addendum)

## 2018-03-07 DIAGNOSIS — H6123 Impacted cerumen, bilateral: Secondary | ICD-10-CM | POA: Diagnosis not present

## 2018-03-07 DIAGNOSIS — R42 Dizziness and giddiness: Secondary | ICD-10-CM | POA: Diagnosis not present

## 2018-03-21 DIAGNOSIS — I10 Essential (primary) hypertension: Secondary | ICD-10-CM | POA: Diagnosis not present

## 2018-03-21 DIAGNOSIS — E11610 Type 2 diabetes mellitus with diabetic neuropathic arthropathy: Secondary | ICD-10-CM | POA: Diagnosis not present

## 2018-03-21 DIAGNOSIS — K210 Gastro-esophageal reflux disease with esophagitis: Secondary | ICD-10-CM | POA: Diagnosis not present

## 2018-03-21 DIAGNOSIS — M0589 Other rheumatoid arthritis with rheumatoid factor of multiple sites: Secondary | ICD-10-CM | POA: Diagnosis not present

## 2018-04-19 DIAGNOSIS — M0589 Other rheumatoid arthritis with rheumatoid factor of multiple sites: Secondary | ICD-10-CM | POA: Diagnosis not present

## 2018-04-19 DIAGNOSIS — I10 Essential (primary) hypertension: Secondary | ICD-10-CM | POA: Diagnosis not present

## 2018-04-19 DIAGNOSIS — E11610 Type 2 diabetes mellitus with diabetic neuropathic arthropathy: Secondary | ICD-10-CM | POA: Diagnosis not present

## 2018-04-19 DIAGNOSIS — K210 Gastro-esophageal reflux disease with esophagitis: Secondary | ICD-10-CM | POA: Diagnosis not present

## 2018-05-24 DIAGNOSIS — H6121 Impacted cerumen, right ear: Secondary | ICD-10-CM | POA: Diagnosis not present

## 2018-05-24 DIAGNOSIS — Z79899 Other long term (current) drug therapy: Secondary | ICD-10-CM | POA: Diagnosis not present

## 2018-05-24 DIAGNOSIS — E119 Type 2 diabetes mellitus without complications: Secondary | ICD-10-CM | POA: Diagnosis not present

## 2018-05-24 DIAGNOSIS — E7849 Other hyperlipidemia: Secondary | ICD-10-CM | POA: Diagnosis not present

## 2018-05-24 DIAGNOSIS — E785 Hyperlipidemia, unspecified: Secondary | ICD-10-CM | POA: Diagnosis not present

## 2018-05-24 DIAGNOSIS — I10 Essential (primary) hypertension: Secondary | ICD-10-CM | POA: Diagnosis not present

## 2018-06-16 DIAGNOSIS — I10 Essential (primary) hypertension: Secondary | ICD-10-CM | POA: Diagnosis not present

## 2018-06-16 DIAGNOSIS — E119 Type 2 diabetes mellitus without complications: Secondary | ICD-10-CM | POA: Diagnosis not present

## 2018-06-16 DIAGNOSIS — E7849 Other hyperlipidemia: Secondary | ICD-10-CM | POA: Diagnosis not present

## 2018-06-29 DIAGNOSIS — E119 Type 2 diabetes mellitus without complications: Secondary | ICD-10-CM | POA: Diagnosis not present

## 2018-06-29 DIAGNOSIS — I10 Essential (primary) hypertension: Secondary | ICD-10-CM | POA: Diagnosis not present

## 2018-06-29 DIAGNOSIS — E7849 Other hyperlipidemia: Secondary | ICD-10-CM | POA: Diagnosis not present

## 2018-07-21 DIAGNOSIS — I10 Essential (primary) hypertension: Secondary | ICD-10-CM | POA: Diagnosis not present

## 2018-07-21 DIAGNOSIS — E7849 Other hyperlipidemia: Secondary | ICD-10-CM | POA: Diagnosis not present

## 2018-07-21 DIAGNOSIS — E119 Type 2 diabetes mellitus without complications: Secondary | ICD-10-CM | POA: Diagnosis not present

## 2018-08-23 DIAGNOSIS — E119 Type 2 diabetes mellitus without complications: Secondary | ICD-10-CM | POA: Diagnosis not present

## 2018-08-23 DIAGNOSIS — E7849 Other hyperlipidemia: Secondary | ICD-10-CM | POA: Diagnosis not present

## 2018-08-23 DIAGNOSIS — I10 Essential (primary) hypertension: Secondary | ICD-10-CM | POA: Diagnosis not present

## 2018-09-07 DIAGNOSIS — E7849 Other hyperlipidemia: Secondary | ICD-10-CM | POA: Diagnosis not present

## 2018-09-07 DIAGNOSIS — E119 Type 2 diabetes mellitus without complications: Secondary | ICD-10-CM | POA: Diagnosis not present

## 2018-09-07 DIAGNOSIS — I10 Essential (primary) hypertension: Secondary | ICD-10-CM | POA: Diagnosis not present

## 2018-11-12 DIAGNOSIS — Z7984 Long term (current) use of oral hypoglycemic drugs: Secondary | ICD-10-CM | POA: Diagnosis not present

## 2018-11-12 DIAGNOSIS — Z79899 Other long term (current) drug therapy: Secondary | ICD-10-CM | POA: Diagnosis not present

## 2018-11-12 DIAGNOSIS — M069 Rheumatoid arthritis, unspecified: Secondary | ICD-10-CM | POA: Diagnosis not present

## 2018-11-12 DIAGNOSIS — J4520 Mild intermittent asthma, uncomplicated: Secondary | ICD-10-CM | POA: Diagnosis not present

## 2018-11-12 DIAGNOSIS — I959 Hypotension, unspecified: Secondary | ICD-10-CM | POA: Diagnosis not present

## 2018-11-12 DIAGNOSIS — E1122 Type 2 diabetes mellitus with diabetic chronic kidney disease: Secondary | ICD-10-CM | POA: Diagnosis not present

## 2018-11-12 DIAGNOSIS — D631 Anemia in chronic kidney disease: Secondary | ICD-10-CM | POA: Diagnosis not present

## 2018-11-12 DIAGNOSIS — Z9049 Acquired absence of other specified parts of digestive tract: Secondary | ICD-10-CM | POA: Diagnosis not present

## 2018-11-12 DIAGNOSIS — E860 Dehydration: Secondary | ICD-10-CM | POA: Diagnosis not present

## 2018-11-12 DIAGNOSIS — Z955 Presence of coronary angioplasty implant and graft: Secondary | ICD-10-CM | POA: Diagnosis not present

## 2018-11-12 DIAGNOSIS — I2510 Atherosclerotic heart disease of native coronary artery without angina pectoris: Secondary | ICD-10-CM | POA: Diagnosis not present

## 2018-11-12 DIAGNOSIS — Z96651 Presence of right artificial knee joint: Secondary | ICD-10-CM | POA: Diagnosis not present

## 2018-11-12 DIAGNOSIS — R197 Diarrhea, unspecified: Secondary | ICD-10-CM | POA: Diagnosis not present

## 2018-11-12 DIAGNOSIS — R42 Dizziness and giddiness: Secondary | ICD-10-CM | POA: Diagnosis not present

## 2018-11-12 DIAGNOSIS — I129 Hypertensive chronic kidney disease with stage 1 through stage 4 chronic kidney disease, or unspecified chronic kidney disease: Secondary | ICD-10-CM | POA: Diagnosis not present

## 2018-11-12 DIAGNOSIS — R262 Difficulty in walking, not elsewhere classified: Secondary | ICD-10-CM | POA: Diagnosis not present

## 2018-11-12 DIAGNOSIS — Z886 Allergy status to analgesic agent status: Secondary | ICD-10-CM | POA: Diagnosis not present

## 2018-11-12 DIAGNOSIS — N182 Chronic kidney disease, stage 2 (mild): Secondary | ICD-10-CM | POA: Diagnosis not present

## 2018-11-12 DIAGNOSIS — R252 Cramp and spasm: Secondary | ICD-10-CM | POA: Diagnosis not present

## 2018-11-12 DIAGNOSIS — Z7982 Long term (current) use of aspirin: Secondary | ICD-10-CM | POA: Diagnosis not present

## 2018-11-12 DIAGNOSIS — I700 Atherosclerosis of aorta: Secondary | ICD-10-CM | POA: Diagnosis not present

## 2018-11-12 DIAGNOSIS — N289 Disorder of kidney and ureter, unspecified: Secondary | ICD-10-CM | POA: Diagnosis not present

## 2018-11-12 DIAGNOSIS — I252 Old myocardial infarction: Secondary | ICD-10-CM | POA: Diagnosis not present

## 2018-11-12 DIAGNOSIS — E785 Hyperlipidemia, unspecified: Secondary | ICD-10-CM | POA: Diagnosis not present

## 2018-11-12 DIAGNOSIS — R55 Syncope and collapse: Secondary | ICD-10-CM | POA: Diagnosis not present

## 2018-11-13 DIAGNOSIS — I129 Hypertensive chronic kidney disease with stage 1 through stage 4 chronic kidney disease, or unspecified chronic kidney disease: Secondary | ICD-10-CM | POA: Diagnosis not present

## 2018-11-13 DIAGNOSIS — R55 Syncope and collapse: Secondary | ICD-10-CM | POA: Diagnosis not present

## 2018-11-13 DIAGNOSIS — E1122 Type 2 diabetes mellitus with diabetic chronic kidney disease: Secondary | ICD-10-CM | POA: Diagnosis not present

## 2018-11-13 DIAGNOSIS — N182 Chronic kidney disease, stage 2 (mild): Secondary | ICD-10-CM | POA: Diagnosis not present

## 2018-11-13 DIAGNOSIS — N289 Disorder of kidney and ureter, unspecified: Secondary | ICD-10-CM | POA: Diagnosis not present

## 2018-11-14 ENCOUNTER — Encounter: Payer: Self-pay | Admitting: Cardiology

## 2018-11-14 DIAGNOSIS — N289 Disorder of kidney and ureter, unspecified: Secondary | ICD-10-CM | POA: Diagnosis not present

## 2018-11-14 DIAGNOSIS — N182 Chronic kidney disease, stage 2 (mild): Secondary | ICD-10-CM | POA: Diagnosis not present

## 2018-11-14 DIAGNOSIS — R55 Syncope and collapse: Secondary | ICD-10-CM | POA: Diagnosis not present

## 2018-11-14 DIAGNOSIS — I129 Hypertensive chronic kidney disease with stage 1 through stage 4 chronic kidney disease, or unspecified chronic kidney disease: Secondary | ICD-10-CM | POA: Diagnosis not present

## 2018-11-14 DIAGNOSIS — E1122 Type 2 diabetes mellitus with diabetic chronic kidney disease: Secondary | ICD-10-CM | POA: Diagnosis not present

## 2018-11-14 DIAGNOSIS — I348 Other nonrheumatic mitral valve disorders: Secondary | ICD-10-CM | POA: Diagnosis not present

## 2018-11-21 DIAGNOSIS — R55 Syncope and collapse: Secondary | ICD-10-CM | POA: Diagnosis not present

## 2018-11-21 DIAGNOSIS — Z1389 Encounter for screening for other disorder: Secondary | ICD-10-CM | POA: Diagnosis not present

## 2018-11-21 DIAGNOSIS — Z0000 Encounter for general adult medical examination without abnormal findings: Secondary | ICD-10-CM | POA: Diagnosis not present

## 2018-11-21 DIAGNOSIS — M5431 Sciatica, right side: Secondary | ICD-10-CM | POA: Diagnosis not present

## 2018-11-30 ENCOUNTER — Ambulatory Visit: Payer: Medicare Other | Admitting: Cardiology

## 2018-12-01 DIAGNOSIS — M1611 Unilateral primary osteoarthritis, right hip: Secondary | ICD-10-CM | POA: Diagnosis not present

## 2018-12-01 DIAGNOSIS — Z96653 Presence of artificial knee joint, bilateral: Secondary | ICD-10-CM | POA: Diagnosis not present

## 2018-12-01 DIAGNOSIS — M170 Bilateral primary osteoarthritis of knee: Secondary | ICD-10-CM | POA: Diagnosis not present

## 2018-12-05 DIAGNOSIS — M1611 Unilateral primary osteoarthritis, right hip: Secondary | ICD-10-CM | POA: Diagnosis not present

## 2018-12-06 DIAGNOSIS — I10 Essential (primary) hypertension: Secondary | ICD-10-CM | POA: Diagnosis not present

## 2018-12-06 DIAGNOSIS — M0589 Other rheumatoid arthritis with rheumatoid factor of multiple sites: Secondary | ICD-10-CM | POA: Diagnosis not present

## 2018-12-06 DIAGNOSIS — E11610 Type 2 diabetes mellitus with diabetic neuropathic arthropathy: Secondary | ICD-10-CM | POA: Diagnosis not present

## 2018-12-18 ENCOUNTER — Ambulatory Visit: Payer: Medicare Other | Admitting: Physician Assistant

## 2018-12-18 DIAGNOSIS — Z96653 Presence of artificial knee joint, bilateral: Secondary | ICD-10-CM | POA: Diagnosis not present

## 2018-12-18 DIAGNOSIS — M1611 Unilateral primary osteoarthritis, right hip: Secondary | ICD-10-CM | POA: Diagnosis not present

## 2019-01-01 DIAGNOSIS — K921 Melena: Secondary | ICD-10-CM | POA: Diagnosis not present

## 2019-01-02 DIAGNOSIS — Z23 Encounter for immunization: Secondary | ICD-10-CM | POA: Diagnosis not present

## 2019-01-16 ENCOUNTER — Other Ambulatory Visit: Payer: Self-pay

## 2019-01-16 ENCOUNTER — Encounter: Payer: Self-pay | Admitting: Cardiology

## 2019-01-16 ENCOUNTER — Ambulatory Visit (INDEPENDENT_AMBULATORY_CARE_PROVIDER_SITE_OTHER): Payer: Medicare Other | Admitting: Cardiology

## 2019-01-16 VITALS — BP 118/58 | HR 69 | Ht 66.0 in | Wt 212.0 lb

## 2019-01-16 DIAGNOSIS — R55 Syncope and collapse: Secondary | ICD-10-CM

## 2019-01-16 DIAGNOSIS — I10 Essential (primary) hypertension: Secondary | ICD-10-CM

## 2019-01-16 DIAGNOSIS — N1832 Chronic kidney disease, stage 3b: Secondary | ICD-10-CM

## 2019-01-16 DIAGNOSIS — I25119 Atherosclerotic heart disease of native coronary artery with unspecified angina pectoris: Secondary | ICD-10-CM

## 2019-01-16 NOTE — Patient Instructions (Addendum)

## 2019-01-16 NOTE — Progress Notes (Signed)
Cardiology Office Note  Date: 01/16/2019   ID: Diana, Patterson Jan 02, 1946, MRN [id number]  PCP:  Diana Burly, MD  Cardiologist:  Diana Lesches, MD Electrophysiologist:  None   Chief Complaint  Patient presents with  . Cardiac follow-up    History of Present Illness: Diana Patterson is a 73 y.o. female last seen in December 2019.  She presents for a follow-up visit.  Since last encounter she tells me that she was hospitalized at [facility] back in August.  I was able to review records.  She tells me that she had had dinner with her husband, subsequently was feeling lightheaded and ultimately summoned EMS who had reported difficulty checking her pulse.  She does not recall actually having frank syncope and was aware of what was going on.  Systolic was in the 0000000 to 100s on assessment in the ER.  She was given IV fluids.  She was observed without obvious acute findings.  Carotid Dopplers showed minimal atherosclerotic plaque.  Chest x-ray reported no acute findings.  I personally reviewed her tracing from August 23 which shows sinus rhythm.  Echocardiogram was also obtained reporting LVEF greater than 55% with normal diastolic function, mitral annular calcification without significant mitral regurgitation, no pericardial effusion.  Troponin T was negative and NT-proBNP was in normal range.  She presents today stating that she has had no subsequent episodes since August.  No palpitations or exertional chest pain.  She continues on stable cardiac regimen.  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  She is seeing Dr. Wynelle Patterson in December.  She is using a rolling walker at this time.  Reports no falls.   Past Medical History:  Diagnosis Date  . CAD (coronary artery disease)    DES to circumflex June 2017, moderate residual LAD disease  . CKD (chronic kidney disease) stage 3, GFR 30-59 ml/min     Dr. Lowanda Foster  . Essential hypertension, benign   . Mixed hyperlipidemia   . NSTEMI (non-ST elevated myocardial infarction) ([place])    Probable type II event, negative Cardiolite 2012  . TIA (transient ischemic attack)   . Type 2 diabetes mellitus ([place])     Past Surgical History:  Procedure Laterality Date  . CARDIAC CATHETERIZATION N/A 08/23/2015   Procedure: Left Heart Cath and Coronary Angiography;  Surgeon: Diana Man, MD;  Location: [facility];  Service: Cardiovascular;  Laterality: N/A;  . CARDIAC CATHETERIZATION N/A 08/23/2015   Procedure: Coronary Stent Intervention;  Surgeon: Diana Man, MD;  Location: [facility];  Service: Cardiovascular;  Laterality: N/A;  . Tubal pregnancy removal      Current Outpatient Medications  Medication Sig Dispense Refill  . aspirin 81 MG tablet Take 81 mg by mouth daily.      [name] atorvastatin (LIPITOR) 80 MG tablet Take 1 tablet (80 mg total) by mouth daily at 6 PM. 30 tablet 5  . Calcium Carb-Cholecalciferol (CALCIUM 1000 + D) 1000-800 MG-UNIT TABS Take 1 tablet by mouth daily.     . Choline Fenofibrate (TRILIPIX) 135 MG capsule Take 135 mg by mouth daily.      [name] dexlansoprazole (DEXILANT) 60 MG capsule Take 1 capsule (60 mg total) by mouth daily. 90 capsule 3  . folic acid (FOLVITE) 1 MG tablet Take 1 mg by mouth See admin instructions. Pt takes every day except Saturday    .  furosemide (LASIX) 20 MG tablet Take 20 mg by mouth daily.     . isosorbide dinitrate (ISORDIL) 30 MG tablet Take 1 tablet (30 mg total) by mouth daily. 30 tablet 5  . levocetirizine (XYZAL) 5 MG tablet Take 5 mg by mouth every evening.    . metaxalone (SKELAXIN) 800 MG tablet Take 800 mg by mouth 2 (two) times daily.     . metFORMIN (GLUCOPHAGE) 500 MG tablet Take 500 mg by mouth 2 (two) times daily with a meal.    . methotrexate (RHEUMATREX) 2.5 MG tablet Take 20 mg by mouth once a week. 8 tabs once weekly.  Pt takes on Saturday    . metoprolol  tartrate (LOPRESSOR) 50 MG tablet Take 1 tablet (50 mg total) by mouth 2 (two) times daily. 60 tablet 5  . nitroGLYCERIN (NITROSTAT) 0.4 MG SL tablet Place 1 tablet (0.4 mg total) under the tongue every 5 (five) minutes as needed for chest pain. 25 tablet 3  . Omega-3 Fatty Acids (FISH OIL) 1000 MG CAPS Take 1 capsule by mouth 2 (two) times daily.     [name] POLYSACCH FE COMPLEX-VIT D3 PO Take 1 tablet by mouth daily.    . Polysaccharide Iron Complex (POLY-IRON 150 PO) Take by mouth daily.    . potassium chloride SA (K-DUR,KLOR-CON) 20 MEQ tablet Take 20 mEq by mouth daily.    . valACYclovir (VALTREX) 500 MG tablet Take 500 mg by mouth daily.     No current facility-administered medications for this visit.    Allergies:  Morphine and related   Social History: The patient  reports that she quit smoking about 40 years ago. Her smoking use included cigarettes. She has a 20.00 pack-year smoking history. She has never used smokeless tobacco. She reports that she does not drink alcohol or use drugs.   ROS:  Please see the history of present illness. Otherwise, complete review of systems is positive for none.  All other systems are reviewed and negative.   Physical Exam: VS:  BP (!) 118/58   Pulse 69   Ht 5\' 6"  (1.676 m)   Wt 212 lb (96.2 kg)   SpO2 98%   BMI 34.22 kg/m , BMI Body mass index is 34.22 kg/m.  Wt Readings from Last 3 Encounters:  01/16/19 212 lb (96.2 kg)  03/06/18 217 lb (98.4 kg)  07/12/17 221 lb (100.2 kg)    General: Patient appears comfortable at rest. HEENT: Conjunctiva and lids normal, wearing a mask. Neck: Supple, no elevated JVP or carotid bruits, no thyromegaly. Lungs: Clear to auscultation, nonlabored breathing at rest. Cardiac: Regular rate and rhythm, no S3, soft systolic murmur. Abdomen: Soft, nontender, bowel sounds present. Extremities: No pitting edema, distal pulses 2+. Skin: Warm and dry. Musculoskeletal: No kyphosis. Neuropsychiatric: Alert and oriented  x3, affect grossly appropriate.  ECG:  An ECG dated 03/06/2018 was personally reviewed today and demonstrated:  Sinus rhythm with low voltage in the precordial leads.  Recent Labwork:  August 2020: Potassium 3.9, BUN 29, creatinine 1.63, AST 34, ALT 32, NT-proBNP 198, troponin T less than 0.01, TSH 1.31, hemoglobin 10.2, platelets 271  Other Studies Reviewed Today:  Cardiac catheterization 08/23/2015:  Prox Cx to Mid Cx lesion, 90% stenosed. Post intervention aspiration thrombectomy followed by Promus Premier DES 3.5 mm x 12 mm (3.75 mm) , there is a 0% residual stenosis.  Prox LAD-2 lesion, 50-75% stenosed.  The left ventricular systolic function is normal.  Elevated LVEDP  Likely culprit lesion was  the thrombotic stenosis of the circumflex. It would appear that there was reperfusion after heparin and aspirin was given in the emergency room. This would be consistent with her improved chest pain upon arrival.  Successful PCI of the proximal dominant Circumflex. There is residual disease in the proximal-mid LAD prior to D1. This does not appear to be flow-limiting, however would probably benefit from being investigated. I would determine course of action based on her symptoms. If she is a symptomatically, could consider discharge with plans for outpatient Myoview to basket the LAD, otherwise if she has symptoms of angina following this PCI, I would proceed with staged PCI of the LAD prior to her discharge.  Echocardiogram 08/25/2015: Study Conclusions  - Left ventricle: The cavity size was normal. Wall thickness was  increased in a pattern of mild LVH. Systolic function was normal.  The estimated ejection fraction was in the range of 60% to 65%.  Wall motion was normal; there were no regional wall motion  abnormalities. Doppler parameters are consistent with abnormal  left ventricular relaxation (grade 1 diastolic dysfunction). - Mitral valve: Mildly calcified annulus.   LexiscanMyoview 11/11/2015:  No diagnostic ST segment changes to indicate ischemia.  Small, mild intensity, apical anterior defect that is predominantly fixed and most consistent with variable soft tissue attenuation. No large degree of anterior ischemia noted.  This is a low risk study.  Nuclear stress EF: 54%.  Assessment and Plan:  1.  Episode of lightheadedness and near syncope in August, no recurrences.  I reviewed records.  No clear evidence of obvious cardiac arrhythmia or ACS based on review.  ECG was nonspecific.  Echocardiogram showed normal LVEF.  At this point would recommend observation.  If she starts to have more regular episodes, a cardiac monitor would be the next step.  2.  CAD with history of DES to the circumflex in 2017 and moderate residual LAD disease that is being managed medically.  She does not report any active angina at this time and continues on aspirin, beta-blocker, nitrate, and statin.  3.  Essential hypertension, blood pressure is normal today.  4.  CKD stage III, she follows with nephrology.  Last creatinine was 1.63.  Medication Adjustments/Labs and Tests Ordered: Current medicines are reviewed at length with the patient today.  Concerns regarding medicines are outlined above.   Tests Ordered: No orders of the defined types were placed in this encounter.   Medication Changes: No orders of the defined types were placed in this encounter.   Disposition:  Follow up 6 months in the [place] office.  Signed, Satira Sark, MD, [facility] 01/16/2019 10:35 AM    [facility] [place], [place],  [address] Phone: [phone]; Fax: [phone]

## 2019-01-17 DIAGNOSIS — Z01818 Encounter for other preprocedural examination: Secondary | ICD-10-CM | POA: Diagnosis not present

## 2019-02-08 DIAGNOSIS — M0589 Other rheumatoid arthritis with rheumatoid factor of multiple sites: Secondary | ICD-10-CM | POA: Diagnosis not present

## 2019-02-08 DIAGNOSIS — I10 Essential (primary) hypertension: Secondary | ICD-10-CM | POA: Diagnosis not present

## 2019-02-08 DIAGNOSIS — E11610 Type 2 diabetes mellitus with diabetic neuropathic arthropathy: Secondary | ICD-10-CM | POA: Diagnosis not present

## 2019-02-26 DIAGNOSIS — M069 Rheumatoid arthritis, unspecified: Secondary | ICD-10-CM | POA: Diagnosis not present

## 2019-02-26 DIAGNOSIS — E7849 Other hyperlipidemia: Secondary | ICD-10-CM | POA: Diagnosis not present

## 2019-02-26 DIAGNOSIS — E119 Type 2 diabetes mellitus without complications: Secondary | ICD-10-CM | POA: Diagnosis not present

## 2019-02-26 DIAGNOSIS — I10 Essential (primary) hypertension: Secondary | ICD-10-CM | POA: Diagnosis not present

## 2019-03-02 DIAGNOSIS — Z01419 Encounter for gynecological examination (general) (routine) without abnormal findings: Secondary | ICD-10-CM | POA: Diagnosis not present

## 2019-03-02 DIAGNOSIS — N3281 Overactive bladder: Secondary | ICD-10-CM | POA: Diagnosis not present

## 2019-03-02 DIAGNOSIS — Z1231 Encounter for screening mammogram for malignant neoplasm of breast: Secondary | ICD-10-CM | POA: Diagnosis not present

## 2019-04-05 DIAGNOSIS — M25551 Pain in right hip: Secondary | ICD-10-CM | POA: Diagnosis not present

## 2019-04-05 DIAGNOSIS — M1611 Unilateral primary osteoarthritis, right hip: Secondary | ICD-10-CM | POA: Diagnosis not present

## 2019-04-06 DIAGNOSIS — M069 Rheumatoid arthritis, unspecified: Secondary | ICD-10-CM | POA: Diagnosis not present

## 2019-04-06 DIAGNOSIS — I10 Essential (primary) hypertension: Secondary | ICD-10-CM | POA: Diagnosis not present

## 2019-04-06 DIAGNOSIS — E119 Type 2 diabetes mellitus without complications: Secondary | ICD-10-CM | POA: Diagnosis not present

## 2019-04-06 DIAGNOSIS — E7849 Other hyperlipidemia: Secondary | ICD-10-CM | POA: Diagnosis not present

## 2019-04-07 DIAGNOSIS — Z955 Presence of coronary angioplasty implant and graft: Secondary | ICD-10-CM | POA: Diagnosis not present

## 2019-04-07 DIAGNOSIS — Z7982 Long term (current) use of aspirin: Secondary | ICD-10-CM | POA: Diagnosis not present

## 2019-04-07 DIAGNOSIS — E785 Hyperlipidemia, unspecified: Secondary | ICD-10-CM | POA: Diagnosis not present

## 2019-04-07 DIAGNOSIS — Z79899 Other long term (current) drug therapy: Secondary | ICD-10-CM | POA: Diagnosis not present

## 2019-04-07 DIAGNOSIS — N390 Urinary tract infection, site not specified: Secondary | ICD-10-CM | POA: Diagnosis not present

## 2019-04-07 DIAGNOSIS — Z7984 Long term (current) use of oral hypoglycemic drugs: Secondary | ICD-10-CM | POA: Diagnosis not present

## 2019-04-07 DIAGNOSIS — Z87891 Personal history of nicotine dependence: Secondary | ICD-10-CM | POA: Diagnosis not present

## 2019-04-07 DIAGNOSIS — Z96653 Presence of artificial knee joint, bilateral: Secondary | ICD-10-CM | POA: Diagnosis not present

## 2019-04-07 DIAGNOSIS — I252 Old myocardial infarction: Secondary | ICD-10-CM | POA: Diagnosis not present

## 2019-04-07 DIAGNOSIS — R42 Dizziness and giddiness: Secondary | ICD-10-CM | POA: Diagnosis not present

## 2019-04-07 DIAGNOSIS — I10 Essential (primary) hypertension: Secondary | ICD-10-CM | POA: Diagnosis not present

## 2019-04-07 DIAGNOSIS — R519 Headache, unspecified: Secondary | ICD-10-CM | POA: Diagnosis not present

## 2019-05-02 NOTE — Patient Instructions (Signed)
DUE TO COVID-19 ONLY ONE VISITOR IS ALLOWED TO COME WITH YOU AND STAY IN THE WAITING ROOM ONLY DURING PRE OP AND PROCEDURE DAY OF SURGERY. THE 1 VISITOR MAY VISIT WITH YOU AFTER SURGERY IN YOUR PRIVATE ROOM DURING VISITING HOURS ONLY!  YOU NEED TO HAVE A COVID 19 TEST ON: 05/03/19 @ 12:05, THIS TEST MUST BE DONE BEFORE SURGERY, COME  [place], [place] , [id number].  ([place]) ONCE YOUR COVID TEST IS COMPLETED, PLEASE BEGIN THE QUARANTINE INSTRUCTIONS AS OUTLINED IN YOUR HANDOUT.                Tristan Schroeder    Your procedure is scheduled on: 05/07/19   Report to [facility] Main  Entrance   Report to [place] at 5:30 AM     Call this number if you have problems the morning of surgery [phone]    Remember:   [place], NO CHEWING GUM [place].     Take these medicines the morning of surgery with A SIP OF WATER: FENOFIBRATE(TRILIPIX),ISOSORBIDE,METOPROLOL,VALACYCLOVIR  DO NOT TAKE ANY DIABETIC MEDICATIONS DAY OF YOUR SURGERY                                How to Manage Your Diabetes Before and After Surgery  Why is it important to control my blood sugar before and after surgery? . Improving blood sugar levels before and after surgery helps healing and can limit problems. . A way of improving blood sugar control is eating a healthy diet by: o  Eating less sugar and carbohydrates o  Increasing activity/exercise o  Talking with your doctor about reaching your blood sugar goals . High blood sugars (greater than 180 mg/dL) can raise your risk of infections and slow your recovery, so you will need to focus on controlling your diabetes during the weeks before surgery. . Make sure that the doctor who takes care of your diabetes knows about your planned surgery including the date and location.  How do I manage my blood sugar before surgery? . Check your blood sugar at least 4 times  a day, starting 2 days before surgery, to make sure that the level is not too high or low. o Check your blood sugar the morning of your surgery when you wake up and every 2 hours until you get to the Short Stay unit. . If your blood sugar is less than 70 mg/dL, you will need to treat for low blood sugar: o Do not take insulin. o Treat a low blood sugar (less than 70 mg/dL) with  cup of clear juice (cranberry or apple), 4 glucose tablets, OR glucose gel. o Recheck blood sugar in 15 minutes after treatment (to make sure it is greater than 70 mg/dL). If your blood sugar is not greater than 70 mg/dL on recheck, call [phone] for further instructions. . Report your blood sugar to the short stay nurse when you get to Short Stay.  . If you are admitted to the hospital after surgery: o Your blood sugar will be checked by the staff and you will probably be given insulin after surgery (instead of oral diabetes medicines) to make sure you have good blood sugar levels. o The goal for blood sugar control after surgery is 80-180 mg/dL.   WHAT DO I DO ABOUT MY DIABETES MEDICATION?  [name]  Do not take oral diabetes medicines (pills) the morning of surgery.  . THE DAY BEFORE SURGERY, take METFORMIN AS USUAL      . THE MORNING OF SURGERY, DO NOT take METFORMIN      You may not have any metal on your body including hair pins and              piercings  Do not wear jewelry, make-up, lotions, powders or perfumes, deodorant             Do not wear nail polish on your fingernails.  Do not shave  48 hours prior to surgery.                 Do not bring valuables to the hospital. [place].  Contacts, dentures or bridgework may not be worn into surgery.  Leave suitcase in the car. After surgery it may be brought to your room.   NO SOLID FOOD AFTER MIDNIGHT THE NIGHT PRIOR TO SURGERY. NOTHING BY MOUTH EXCEPT CLEAR LIQUIDS UNTIL: 4:15 AM . PLEASE FINISH  GATORADE DRINK PER SURGEON ORDER  WHICH NEEDS TO BE COMPLETED AT .   CLEAR LIQUID DIET   Foods Allowed                                                                     Foods Excluded  Coffee and tea, regular and decaf                             liquids that you cannot  Plain Jell-O any favor except red or purple                                           see through such as: Fruit ices (not with fruit pulp)                                     milk, soups, orange juice  Iced Popsicles                                    All solid food Carbonated beverages, regular and diet                                    Cranberry, grape and apple juices Sports drinks like Gatorade Lightly seasoned clear broth or consume(fat free) Sugar, honey syrup  Sample Menu Breakfast                                Lunch  Supper Cranberry juice                    Beef broth                            Chicken broth Jell-O                                     Grape juice                           Apple juice Coffee or tea                        Jell-O                                      Popsicle                                                Coffee or tea                        Coffee or tea  _____________________________________________________________________             [facility] - Preparing for Surgery Before surgery, you can play an important role.  Because skin is not sterile, your skin needs to be as free of germs as possible.  You can reduce the number of germs on your skin by washing with CHG (chlorahexidine gluconate) soap before surgery.  CHG is an antiseptic cleaner which kills germs and bonds with the skin to continue killing germs even after washing. Please DO NOT use if you have an allergy to CHG or antibacterial soaps.  If your skin becomes reddened/irritated stop using the CHG and inform your nurse when you arrive at Short Stay. Do not shave (including legs  and underarms) for at least 48 hours prior to the first CHG shower.  You may shave your face/neck. Please follow these instructions carefully:  1.  Shower with CHG Soap the night before surgery and the  morning of Surgery.  2.  If you choose to wash your hair, wash your hair first as usual with your  normal  shampoo.  3.  After you shampoo, rinse your hair and body thoroughly to remove the  shampoo.                           4.  Use CHG as you would any other liquid soap.  You can apply chg directly  to the skin and wash                       Gently with a scrungie or clean washcloth.  5.  Apply the CHG Soap to your body ONLY FROM THE NECK DOWN.   Do not use on face/ open                           Wound or open sores. Avoid contact  with eyes, ears mouth and genitals (private parts).                       Wash face,  Genitals (private parts) with your normal soap.             6.  Wash thoroughly, paying special attention to the area where your surgery  will be performed.  7.  Thoroughly rinse your body with warm water from the neck down.  8.  DO NOT shower/wash with your normal soap after using and rinsing off  the CHG Soap.                9.  Pat yourself dry with a clean towel.            10.  Wear clean pajamas.            11.  Place clean sheets on your bed the night of your first shower and do not  sleep with pets. Day of Surgery : Do not apply any lotions/deodorants the morning of surgery.  Please wear clean clothes to the hospital/surgery center.  FAILURE TO FOLLOW THESE INSTRUCTIONS MAY RESULT IN THE CANCELLATION OF YOUR SURGERY PATIENT SIGNATURE_________________________________  NURSE SIGNATURE__________________________________  ________________________________________________________________________   Adam Phenix  An incentive spirometer is a tool that can help keep your lungs clear and active. This tool measures how well you are filling your lungs with each breath.  Taking long deep breaths may help reverse or decrease the chance of developing breathing (pulmonary) problems (especially infection) following:  A long period of time when you are unable to move or be active. BEFORE THE PROCEDURE   If the spirometer includes an indicator to show your best effort, your nurse or respiratory therapist will set it to a desired goal.  If possible, sit up straight or lean slightly forward. Try not to slouch.  Hold the incentive spirometer in an upright position. INSTRUCTIONS FOR USE  1. Sit on the edge of your bed if possible, or sit up as far as you can in bed or on a chair. 2. Hold the incentive spirometer in an upright position. 3. Breathe out normally. 4. Place the mouthpiece in your mouth and seal your lips tightly around it. 5. Breathe in slowly and as deeply as possible, raising the piston or the ball toward the top of the column. 6. Hold your breath for 3-5 seconds or for as long as possible. Allow the piston or ball to fall to the bottom of the column. 7. Remove the mouthpiece from your mouth and breathe out normally. 8. Rest for a few seconds and repeat Steps 1 through 7 at least 10 times every 1-2 hours when you are awake. Take your time and take a few normal breaths between deep breaths. 9. The spirometer may include an indicator to show your best effort. Use the indicator as a goal to work toward during each repetition. 10. After each set of 10 deep breaths, practice coughing to be sure your lungs are clear. If you have an incision (the cut made at the time of surgery), support your incision when coughing by placing a pillow or rolled up towels firmly against it. Once you are able to get out of bed, walk around indoors and cough well. You may stop using the incentive spirometer when instructed by your caregiver.  RISKS AND COMPLICATIONS  Take your time so you do not get dizzy or light-headed.  If you  are in pain, you may need to take or ask for pain  medication before doing incentive spirometry. It is harder to take a deep breath if you are having pain. AFTER USE  Rest and breathe slowly and easily.  It can be helpful to keep track of a log of your progress. Your caregiver can provide you with a simple table to help with this. If you are using the spirometer at home, follow these instructions: [place] IF:   You are having difficultly using the spirometer.  You have trouble using the spirometer as often as instructed.  Your pain medication is not giving enough relief while using the spirometer.  You develop fever of 100.5 F (38.1 C) or higher. SEEK IMMEDIATE MEDICAL CARE IF:   You cough up bloody sputum that had not been present before.  You develop fever of 102 F (38.9 C) or greater.  You develop worsening pain at or near the incision site. MAKE SURE YOU:   Understand these instructions.  Will watch your condition.  Will get help right away if you are not doing well or get worse. Document Released: 07/19/2006 Document Revised: 05/31/2011 Document Reviewed: 09/19/2006 [facility] ([facility]) Patient Information 2014 [place], [place].   ________________________________________________________________________

## 2019-05-03 ENCOUNTER — Encounter (HOSPITAL_COMMUNITY): Payer: Self-pay | Admitting: Anesthesiology

## 2019-05-03 ENCOUNTER — Encounter (HOSPITAL_COMMUNITY): Payer: Self-pay | Admitting: Physician Assistant

## 2019-05-03 ENCOUNTER — Other Ambulatory Visit: Payer: Self-pay

## 2019-05-03 ENCOUNTER — Other Ambulatory Visit (HOSPITAL_COMMUNITY)
Admission: RE | Admit: 2019-05-03 | Discharge: 2019-05-03 | Disposition: A | Discharge status: Home / Self Care | Payer: Medicare Other | Source: Ambulatory Visit | Attending: Orthopedic Surgery | Admitting: Orthopedic Surgery

## 2019-05-03 ENCOUNTER — Encounter (HOSPITAL_COMMUNITY)
Admission: RE | Admit: 2019-05-03 | Discharge: 2019-05-03 | Disposition: A | Discharge status: Home / Self Care | Payer: Medicare Other | Source: Ambulatory Visit | Attending: Orthopedic Surgery | Admitting: Orthopedic Surgery

## 2019-05-03 ENCOUNTER — Encounter (HOSPITAL_COMMUNITY): Payer: Self-pay

## 2019-05-03 DIAGNOSIS — Z01812 Encounter for preprocedural laboratory examination: Secondary | ICD-10-CM | POA: Insufficient documentation

## 2019-05-03 DIAGNOSIS — N183 Chronic kidney disease, stage 3 unspecified: Secondary | ICD-10-CM | POA: Diagnosis not present

## 2019-05-03 DIAGNOSIS — E118 Type 2 diabetes mellitus with unspecified complications: Secondary | ICD-10-CM | POA: Insufficient documentation

## 2019-05-03 DIAGNOSIS — Z7901 Long term (current) use of anticoagulants: Secondary | ICD-10-CM | POA: Diagnosis not present

## 2019-05-03 DIAGNOSIS — Z7982 Long term (current) use of aspirin: Secondary | ICD-10-CM | POA: Diagnosis not present

## 2019-05-03 DIAGNOSIS — I2510 Atherosclerotic heart disease of native coronary artery without angina pectoris: Secondary | ICD-10-CM | POA: Insufficient documentation

## 2019-05-03 DIAGNOSIS — Z87891 Personal history of nicotine dependence: Secondary | ICD-10-CM | POA: Diagnosis not present

## 2019-05-03 DIAGNOSIS — Z8673 Personal history of transient ischemic attack (TIA), and cerebral infarction without residual deficits: Secondary | ICD-10-CM | POA: Diagnosis not present

## 2019-05-03 DIAGNOSIS — I129 Hypertensive chronic kidney disease with stage 1 through stage 4 chronic kidney disease, or unspecified chronic kidney disease: Secondary | ICD-10-CM | POA: Insufficient documentation

## 2019-05-03 DIAGNOSIS — M1611 Unilateral primary osteoarthritis, right hip: Secondary | ICD-10-CM | POA: Diagnosis not present

## 2019-05-03 DIAGNOSIS — E782 Mixed hyperlipidemia: Secondary | ICD-10-CM | POA: Insufficient documentation

## 2019-05-03 DIAGNOSIS — Z79899 Other long term (current) drug therapy: Secondary | ICD-10-CM | POA: Diagnosis not present

## 2019-05-03 DIAGNOSIS — Z7984 Long term (current) use of oral hypoglycemic drugs: Secondary | ICD-10-CM | POA: Insufficient documentation

## 2019-05-03 DIAGNOSIS — I252 Old myocardial infarction: Secondary | ICD-10-CM | POA: Insufficient documentation

## 2019-05-03 DIAGNOSIS — Z20822 Contact with and (suspected) exposure to covid-19: Secondary | ICD-10-CM | POA: Diagnosis not present

## 2019-05-03 HISTORY — DX: Unspecified osteoarthritis, unspecified site: M19.90

## 2019-05-03 LAB — COMPREHENSIVE METABOLIC PANEL
ALT: 22 U/L (ref 0–44)
AST: 33 U/L (ref 15–41)
Albumin: 3.5 g/dL (ref 3.5–5.0)
Alkaline Phosphatase: 95 U/L (ref 38–126)
Anion gap: 9 (ref 5–15)
BUN: 23 mg/dL (ref 8–23)
CO2: 23 mmol/L (ref 22–32)
Calcium: 9.3 mg/dL (ref 8.9–10.3)
Chloride: 105 mmol/L (ref 98–111)
Creatinine, Ser: 1.54 mg/dL — ABNORMAL HIGH (ref 0.44–1.00)
GFR calc Af Amer: 38 mL/min — ABNORMAL LOW (ref >60)
GFR calc non Af Amer: 33 mL/min — ABNORMAL LOW (ref >60)
Glucose, Bld: 107 mg/dL — ABNORMAL HIGH (ref 70–99)
Potassium: 3.7 mmol/L (ref 3.5–5.1)
Sodium: 137 mmol/L (ref 135–145)
Total Bilirubin: 1.2 mg/dL (ref 0.3–1.2)
Total Protein: 7.6 g/dL (ref 6.5–8.1)

## 2019-05-03 LAB — CBC
HCT: 30.2 % — ABNORMAL LOW (ref 36.0–46.0)
Hemoglobin: 10.2 g/dL — ABNORMAL LOW (ref 12.0–15.0)
MCH: 33 pg (ref 26.0–34.0)
MCHC: 33.8 g/dL (ref 30.0–36.0)
MCV: 97.7 fL (ref 80.0–100.0)
Platelets: 244 10*3/uL (ref 150–400)
RBC: 3.09 MIL/uL — ABNORMAL LOW (ref 3.87–5.11)
RDW: 15.6 % — ABNORMAL HIGH (ref 11.5–15.5)
WBC: 11.9 10*3/uL — ABNORMAL HIGH (ref 4.0–10.5)
nRBC: 0 % (ref 0.0–0.2)

## 2019-05-03 LAB — ABO/RH: ABO/RH(D): A POS

## 2019-05-03 LAB — SURGICAL PCR SCREEN
MRSA, PCR: NEGATIVE
Staphylococcus aureus: NEGATIVE

## 2019-05-03 LAB — SARS CORONAVIRUS 2 (TAT 6-24 HRS): SARS Coronavirus 2: NEGATIVE

## 2019-05-03 LAB — HEMOGLOBIN A1C
Hgb A1c MFr Bld: 4.9 % (ref 4.8–5.6)
Mean Plasma Glucose: 93.93 mg/dL

## 2019-05-03 LAB — PROTIME-INR
INR: 1.2 (ref 0.8–1.2)
Prothrombin Time: 15.5 seconds — ABNORMAL HIGH (ref 11.4–15.2)

## 2019-05-03 LAB — GLUCOSE, CAPILLARY: Glucose-Capillary: 107 mg/dL — ABNORMAL HIGH (ref 70–99)

## 2019-05-03 LAB — APTT: aPTT: 34 seconds (ref 24–36)

## 2019-05-03 NOTE — Progress Notes (Signed)
PCP - Dr. Stoney Bang . LOV:12/06/18. Clearance: 04/19/19:chart Cardiologist -   Chest x-ray - 11/12/18. EPIC EKG - 11/12/18. EPIC Stress Test -  ECHO -  Cardiac Cath -   Sleep Study - Yes  CPAP - Yes  Fasting Blood Sugar - 102 Checks Blood Sugar 1_ time a day  Blood Thinner Instructions:By Dr's Aluisio office Aspirin Instructions:Stop on 04/30/19 Last Dose:  Anesthesia review:   Patient denies shortness of breath, fever, cough and chest pain at PAT appointment   Patient verbalized understanding of instructions that were given to them at the PAT appointment. Patient was also instructed that they will need to review over the PAT instructions again at home before surgery.

## 2019-05-03 NOTE — Progress Notes (Signed)
Anesthesia Chart Review   Case: [id number] Date/Time: 05/07/19 0700   Procedure: TOTAL HIP ARTHROPLASTY ANTERIOR APPROACH (Right Hip) - 141min   Anesthesia type: Choice   Pre-op diagnosis: right hip osteoarthritis   Location: [facility] ROOM 09 / [facility]   Surgeons: Gaynelle Arabian, MD      DISCUSSION:74 y.o. former smoker (20 pack years, quit 03/22/78) with h/o HTN, HLD, DM II, TIA, CKD Stage III (creatinine stable), CAD (DES to circumflex 2017), right hip OA scheduled for above procedure 05/07/19 with Dr. Gaynelle Arabian.   Pt last seen by cardiologist, Dr. Rozann Lesches, 01/16/2019.  Stable at this visit with 6 month follow up recommended.  Clearance on chart which states pt is moderate risk due to, "comorbities, RCRI risk 6.6% change major adverse cardiac event"  Clearance on chart from PCP which states pt is low risk for planned procedure.   Anticipate pt can proceed with planned procedure barring acute status change.   VS: BP (!) 137/57   Pulse 88   Temp 37 C   Resp 18   Ht 5\' 5"  (1.651 m)   Wt 86.4 kg   SpO2 99%   BMI 31.68 kg/m   PROVIDERS: Neale Burly, MD is PCP   Rozann Lesches, MD is Cardiologist  LABS: Labs reviewed: Acceptable for surgery. (all labs ordered are listed, but only abnormal results are displayed)  Labs Reviewed  CBC - Abnormal; Notable for the following components:      Result Value   WBC 11.9 (*)    RBC 3.09 (*)    Hemoglobin 10.2 (*)    HCT 30.2 (*)    RDW 15.6 (*)    All other components within normal limits  COMPREHENSIVE METABOLIC PANEL - Abnormal; Notable for the following components:   Glucose, Bld 107 (*)    Creatinine, Ser 1.54 (*)    GFR calc non Af Amer 33 (*)    GFR calc Af Amer 38 (*)    All other components within normal limits  PROTIME-INR - Abnormal; Notable for the following components:   Prothrombin Time 15.5 (*)    All other components within normal limits  GLUCOSE, CAPILLARY - Abnormal; Notable for the following components:   Glucose-Capillary 107 (*)    All other components within normal limits  SURGICAL PCR SCREEN  APTT  HEMOGLOBIN A1C  TYPE AND SCREEN     IMAGES:   EKG: 11/12/2018 Rate 62 bpm Sinus rhythm  Abnormal R-wave progression No significant changes  CV: Echo 08/25/2015 Study Conclusions   - Left ventricle: The cavity size was normal. Wall thickness was  increased in a pattern of mild LVH. Systolic function was normal.  The estimated ejection fraction was in the range of 60% to 65%.  Wall motion was normal; there were no regional wall motion  abnormalities. Doppler parameters are consistent with abnormal  left ventricular relaxation (grade 1 diastolic dysfunction).  - Mitral valve: Mildly calcified annulus.  Past Medical History:  Diagnosis Date  . Arthritis    RA  . CAD (coronary artery disease)    DES to circumflex June 2017, moderate residual LAD disease  . CKD (chronic kidney disease) stage 3, GFR 30-59 ml/min    Dr. Lowanda Foster  . Essential hypertension, benign   . Mixed hyperlipidemia   . NSTEMI (non-ST elevated myocardial infarction) ([place])    Probable type II event, negative Cardiolite 2012  . TIA (transient ischemic attack)   . Type 2 diabetes mellitus ([place])  Past Surgical History:  Procedure Laterality Date  . CARDIAC CATHETERIZATION N/A 08/23/2015   Procedure: Left Heart Cath and Coronary Angiography;  Surgeon: Leonie Man, MD;  Location: [facility];  Service: Cardiovascular;  Laterality: N/A;  . CARDIAC CATHETERIZATION N/A 08/23/2015   Procedure: Coronary Stent Intervention;  Surgeon: Leonie Man, MD;  Location: [facility];  Service: Cardiovascular;  Laterality: N/A;  . CHOLECYSTECTOMY    . KNEE ARTHROPLASTY Bilateral   . Tubal pregnancy removal      MEDICATIONS: . acetaminophen (TYLENOL) 500 MG tablet  . aspirin 81 MG tablet  . atorvastatin (LIPITOR) 80 MG tablet  . Calcium Carb-Cholecalciferol (CALCIUM 1000 + D) 1000-800  MG-UNIT TABS  . Choline Fenofibrate (TRILIPIX) 135 MG capsule  . CRANBERRY PO  . dexlansoprazole (DEXILANT) 60 MG capsule  . folic acid (FOLVITE) 1 MG tablet  . furosemide (LASIX) 20 MG tablet  . isosorbide dinitrate (ISORDIL) 30 MG tablet  . levocetirizine (XYZAL) 5 MG tablet  . losartan (COZAAR) 25 MG tablet  . metaxalone (SKELAXIN) 800 MG tablet  . metFORMIN (GLUCOPHAGE) 500 MG tablet  . methotrexate (RHEUMATREX) 2.5 MG tablet  . metoprolol tartrate (LOPRESSOR) 50 MG tablet  . nitroGLYCERIN (NITROSTAT) 0.4 MG SL tablet  . Omega-3 Fatty Acids (FISH OIL) 1000 MG CAPS  . Polysaccharide Iron Complex (POLY-IRON 150 PO)  . potassium chloride SA (K-DUR,KLOR-CON) 20 MEQ tablet  . valACYclovir (VALTREX) 500 MG tablet   No current facility-administered medications for this encounter.    Diana Patterson [facility] Pre-Surgical Testing [phone] 05/03/19  1:35 PM

## 2019-05-04 NOTE — H&P (Signed)
TOTAL HIP ADMISSION H&P  Patient is admitted for right total hip arthroplasty.  Subjective:  Chief Complaint: right hip pain  HPI: Diana Patterson, 74 y.o. female, has a history of pain and functional disability in the right hip(s) due to arthritis and patient has failed non-surgical conservative treatments for greater than 12 weeks to include corticosteriod injections and activity modification.  Onset of symptoms was gradual starting 2 years ago with gradually worsening course since that time.The patient noted no past surgery on the right hip(s).  Patient currently rates pain in the right hip at 8 out of 10 with activity. Patient has worsening of pain with activity and weight bearing and pain that interfers with activities of daily living. Patient has evidence of joint space narrowing by imaging studies. This condition presents safety issues increasing the risk of falls.  There is no current active infection.  Patient Active Problem List   Diagnosis Date Noted  . History of total bilateral knee replacement 01/25/2017  . Primary osteoarthritis of both knees 01/25/2017  . ST elevation myocardial infarction (STEMI) of inferior wall, initial episode of care ([place]) 08/23/2015  . ST elevation myocardial infarction (STEMI) of inferior wall ([place]) 08/23/2015  . Preoperative evaluation to rule out surgical contraindication 05/17/2012  . CAD (coronary artery disease), native coronary artery 12/18/2010  . Benign essential hypertension 12/18/2010  . Type 2 diabetes mellitus ([place]) 12/18/2010  . Dyslipidemia 12/18/2010  . CKD (chronic kidney disease) stage 3, GFR 30-59 ml/min 12/18/2010   Past Medical History:  Diagnosis Date  . Arthritis    RA  . CAD (coronary artery disease)    DES to circumflex June 2017, moderate residual LAD disease  . CKD (chronic kidney disease) stage 3, GFR 30-59 ml/min    Dr. Lowanda Foster  . Essential hypertension, benign   . Mixed hyperlipidemia   . NSTEMI (non-ST  elevated myocardial infarction) ([place])    Probable type II event, negative Cardiolite 2012  . TIA (transient ischemic attack)   . Type 2 diabetes mellitus ([place])     Past Surgical History:  Procedure Laterality Date  . CARDIAC CATHETERIZATION N/A 08/23/2015   Procedure: Left Heart Cath and Coronary Angiography;  Surgeon: Leonie Man, MD;  Location: [facility];  Service: Cardiovascular;  Laterality: N/A;  . CARDIAC CATHETERIZATION N/A 08/23/2015   Procedure: Coronary Stent Intervention;  Surgeon: Leonie Man, MD;  Location: [facility];  Service: Cardiovascular;  Laterality: N/A;  . CHOLECYSTECTOMY    . KNEE ARTHROPLASTY Bilateral   . Tubal pregnancy removal      No current facility-administered medications for this encounter.   Current Outpatient Medications  Medication Sig Dispense Refill Last Dose  . acetaminophen (TYLENOL) 500 MG tablet Take 1,000 mg by mouth every 6 (six) hours as needed for moderate pain.     [name] aspirin 81 MG tablet Take 81 mg by mouth daily.       [name] atorvastatin (LIPITOR) 80 MG tablet Take 1 tablet (80 mg total) by mouth daily at 6 PM. 30 tablet 5   . Calcium Carb-Cholecalciferol (CALCIUM 1000 + D) 1000-800 MG-UNIT TABS Take 1 tablet by mouth daily.      . Choline Fenofibrate (TRILIPIX) 135 MG capsule Take 135 mg by mouth daily.       [name] CRANBERRY PO Take 1 tablet by mouth daily.     . folic acid (FOLVITE) 1 MG tablet Take 1 mg by mouth See admin instructions. Take 1 mg by  mouth daily except for Saturday     . furosemide (LASIX) 20 MG tablet Take 20 mg by mouth daily.      . isosorbide dinitrate (ISORDIL) 30 MG tablet Take 1 tablet (30 mg total) by mouth daily. 30 tablet 5   . levocetirizine (XYZAL) 5 MG tablet Take 5 mg by mouth every evening.     [name] losartan (COZAAR) 25 MG tablet Take 25 mg by mouth daily.     . metaxalone (SKELAXIN) 800 MG tablet Take 800 mg by mouth 2 (two) times daily.      . metFORMIN (GLUCOPHAGE) 500 MG tablet Take 500 mg by  mouth 2 (two) times daily with a meal.     . methotrexate (RHEUMATREX) 2.5 MG tablet Take 20 mg by mouth every Saturday.      . metoprolol tartrate (LOPRESSOR) 50 MG tablet Take 1 tablet (50 mg total) by mouth 2 (two) times daily. 60 tablet 5   . nitroGLYCERIN (NITROSTAT) 0.4 MG SL tablet Place 1 tablet (0.4 mg total) under the tongue every 5 (five) minutes as needed for chest pain. 25 tablet 3   . Omega-3 Fatty Acids (FISH OIL) 1000 MG CAPS Take 1,000 mg by mouth 2 (two) times daily.      . Polysaccharide Iron Complex (POLY-IRON 150 PO) Take 150 mg by mouth daily.      . potassium chloride SA (K-DUR,KLOR-CON) 20 MEQ tablet Take 20 mEq by mouth daily.     . valACYclovir (VALTREX) 500 MG tablet Take 500 mg by mouth daily.     [name] dexlansoprazole (DEXILANT) 60 MG capsule Take 1 capsule (60 mg total) by mouth daily. (Patient not taking: Reported on 04/26/2019) 90 capsule 3 Not Taking at Unknown time   Allergies  Allergen Reactions  . Morphine And Related Other (See Comments)    Drowsiness    Social History   Tobacco Use  . Smoking status: Former Smoker    Packs/day: 1.00    Years: 20.00    Pack years: 20.00    Types: Cigarettes    Quit date: 03/22/1978    Years since quitting: 41.1  . Smokeless tobacco: Never Used  Substance Use Topics  . Alcohol use: No    Alcohol/week: 0.0 standard drinks    Family History  Problem Relation Age of Onset  . Heart attack Father   . Diabetes Sister      Review of Systems  Constitutional: Negative for chills and fever.  Cardiovascular: Negative for chest pain and palpitations.  Gastrointestinal: Negative for nausea and vomiting.  Musculoskeletal: Positive for arthralgias.    Objective:  Physical Exam Patient is a 74 year old female.  Well nourished and well developed. General: Alert and oriented x3, cooperative and pleasant, no acute distress. Head: normocephalic, atraumatic, neck supple. Eyes: EOMI. Respiratory: breath sounds clear in all  fields, no wheezing, rales, or rhonchi. Cardiovascular: Regular rate and rhythm, no murmurs, gallops or rubs. Abdomen: non-tender to palpation and soft, normoactive bowel sounds.  Musculoskeletal: Right Hip Exam: The range of motion: Flexion to 100 degrees, Internal Rotation to 0 degrees, External Rotation to 0 degrees, and abduction to 10 degrees. There is no tenderness over the greater trochanteric bursa.  Calves soft and nontender. Motor function intact in LE. Strength 5/5 LE bilaterally. Neuro: Distal pulses 2+. Sensation to light touch intact in LE.  Vital signs in last 24 hours:  Labs:   Estimated body mass index is 31.68 kg/m as calculated from the following:   Height  as of 05/03/19: 5\' 5"  (1.651 m).   Weight as of 05/03/19: 86.4 kg.   Imaging Review Plain radiographs demonstrate severe degenerative joint disease of the right hip(s). The bone quality appears to be adequate for age and reported activity level.   Assessment/Plan:  End stage arthritis, right hip(s)  The patient history, physical examination, clinical judgement of the provider and imaging studies are consistent with end stage degenerative joint disease of the right hip(s) and total hip arthroplasty is deemed medically necessary. The treatment options including medical management, injection therapy, arthroscopy and arthroplasty were discussed at length. The risks and benefits of total hip arthroplasty were presented and reviewed. The risks due to aseptic loosening, infection, stiffness, dislocation/subluxation,  thromboembolic complications and other imponderables were discussed.  The patient acknowledged the explanation, agreed to proceed with the plan and consent was signed. Patient is being admitted for inpatient treatment for surgery, pain control, PT, OT, prophylactic antibiotics, VTE prophylaxis, progressive ambulation and ADL's and discharge planning.The patient is planning to be discharged home.   Therapy  Plans: HEP Disposition: Home with Planned DVT Prophylaxis: Xarelto 10mg  daily (hx of MI) DME needed: walker PCP: Dr. Sherrie Sport, clearance received Cardiologist: Dr. Domenic Polite, clearance received TXA: IV Allergies: morphine - "blacks out" Anesthesia Concerns: none BMI: 32.8   Other: Hx of MI in 2018, with 2 stents.  - Patient was instructed on what medications to stop prior to surgery. - Follow-up visit in 2 weeks with Dr. Wynelle Link - Begin physical therapy following surgery - Pre-operative lab work as pre-surgical testing - Prescriptions will be provided in hospital at time of discharge   Griffith Citron, PA-C Orthopedic Surgery [facility] [place] [phone]

## 2019-05-06 DIAGNOSIS — I6782 Cerebral ischemia: Secondary | ICD-10-CM | POA: Diagnosis not present

## 2019-05-06 DIAGNOSIS — Z20822 Contact with and (suspected) exposure to covid-19: Secondary | ICD-10-CM | POA: Diagnosis not present

## 2019-05-06 DIAGNOSIS — Z7982 Long term (current) use of aspirin: Secondary | ICD-10-CM | POA: Diagnosis not present

## 2019-05-06 DIAGNOSIS — B961 Klebsiella pneumoniae [K. pneumoniae] as the cause of diseases classified elsewhere: Secondary | ICD-10-CM | POA: Diagnosis present

## 2019-05-06 DIAGNOSIS — R6889 Other general symptoms and signs: Secondary | ICD-10-CM | POA: Diagnosis not present

## 2019-05-06 DIAGNOSIS — I129 Hypertensive chronic kidney disease with stage 1 through stage 4 chronic kidney disease, or unspecified chronic kidney disease: Secondary | ICD-10-CM | POA: Diagnosis present

## 2019-05-06 DIAGNOSIS — M1612 Unilateral primary osteoarthritis, left hip: Secondary | ICD-10-CM | POA: Diagnosis not present

## 2019-05-06 DIAGNOSIS — I6523 Occlusion and stenosis of bilateral carotid arteries: Secondary | ICD-10-CM | POA: Diagnosis not present

## 2019-05-06 DIAGNOSIS — J4520 Mild intermittent asthma, uncomplicated: Secondary | ICD-10-CM | POA: Diagnosis present

## 2019-05-06 DIAGNOSIS — N390 Urinary tract infection, site not specified: Secondary | ICD-10-CM | POA: Diagnosis not present

## 2019-05-06 DIAGNOSIS — N183 Chronic kidney disease, stage 3 unspecified: Secondary | ICD-10-CM | POA: Diagnosis present

## 2019-05-06 DIAGNOSIS — D649 Anemia, unspecified: Secondary | ICD-10-CM | POA: Diagnosis present

## 2019-05-06 DIAGNOSIS — Z87891 Personal history of nicotine dependence: Secondary | ICD-10-CM | POA: Diagnosis not present

## 2019-05-06 DIAGNOSIS — R509 Fever, unspecified: Secondary | ICD-10-CM | POA: Diagnosis not present

## 2019-05-06 DIAGNOSIS — I6529 Occlusion and stenosis of unspecified carotid artery: Secondary | ICD-10-CM | POA: Diagnosis present

## 2019-05-06 DIAGNOSIS — E860 Dehydration: Secondary | ICD-10-CM | POA: Diagnosis present

## 2019-05-06 DIAGNOSIS — N10 Acute tubulo-interstitial nephritis: Secondary | ICD-10-CM | POA: Diagnosis present

## 2019-05-06 DIAGNOSIS — G3189 Other specified degenerative diseases of nervous system: Secondary | ICD-10-CM | POA: Diagnosis not present

## 2019-05-06 DIAGNOSIS — Z7984 Long term (current) use of oral hypoglycemic drugs: Secondary | ICD-10-CM | POA: Diagnosis not present

## 2019-05-06 DIAGNOSIS — M069 Rheumatoid arthritis, unspecified: Secondary | ICD-10-CM | POA: Diagnosis present

## 2019-05-06 DIAGNOSIS — E1122 Type 2 diabetes mellitus with diabetic chronic kidney disease: Secondary | ICD-10-CM | POA: Diagnosis present

## 2019-05-06 DIAGNOSIS — E785 Hyperlipidemia, unspecified: Secondary | ICD-10-CM | POA: Diagnosis present

## 2019-05-06 DIAGNOSIS — M47812 Spondylosis without myelopathy or radiculopathy, cervical region: Secondary | ICD-10-CM | POA: Diagnosis not present

## 2019-05-06 DIAGNOSIS — M160 Bilateral primary osteoarthritis of hip: Secondary | ICD-10-CM | POA: Diagnosis present

## 2019-05-06 DIAGNOSIS — I252 Old myocardial infarction: Secondary | ICD-10-CM | POA: Diagnosis not present

## 2019-05-06 NOTE — Anesthesia Preprocedure Evaluation (Deleted)
Anesthesia Evaluation    Reviewed: Allergy & Precautions, Patient's Chart, lab work & pertinent test results  History of Anesthesia Complications Negative for: history of anesthetic complications  Airway        Dental   Pulmonary former smoker,           Cardiovascular hypertension, Pt. on medications and Pt. on home beta blockers + CAD, + Past MI and + Cardiac Stents     Pt last seen by cardiologist, Dr. Rozann Lesches, 01/16/2019.  Stable at this visit with 6 month follow up recommended.  Clearance on chart which states pt is moderate risk due to, "comorbities, RCRI risk 6.6% chance major adverse cardiac event"  '20 Carotid US - No significant stenosis b/l  '17 TTE - mild LVH. EF 60% to 65%. Grade 1 diastolic dysfunction.  '17 Cath - Prox Cx to Mid Cx lesion, 90% stenosed. Post intervention aspiration thrombectomy followed by Promus Premier DES 3.5 mm x 12 mm (3.75 mm) , there is a 0% residual stenosis. Prox LAD-2 lesion, 50-75% stenosed. The left ventricular systolic function is normal. Elevated LVEDP    Neuro/Psych TIAnegative psych ROS   GI/Hepatic negative GI ROS, Neg liver ROS,   Endo/Other  diabetes, Type 2, Oral Hypoglycemic Agents Obesity   Renal/GU CRFRenal disease     Musculoskeletal  (+) Arthritis ,   Abdominal   Peds  Hematology  (+) anemia ,  Plt 244k INR 1.2    Anesthesia Other Findings Covid neg 05/03/19   Reproductive/Obstetrics                             Anesthesia Physical Anesthesia Plan  ASA: III  Anesthesia Plan: Spinal   Post-op Pain Management:    Induction:   PONV Risk Score and Plan: 2 and Treatment may vary due to age or medical condition and Propofol infusion  Airway Management Planned: Natural Airway and Simple Face Mask  Additional Equipment: None  Intra-op Plan:   Post-operative Plan:   Informed Consent:   Plan Discussed with:  CRNA and Anesthesiologist  Anesthesia Plan Comments:         Anesthesia Quick Evaluation

## 2019-05-07 ENCOUNTER — Encounter (HOSPITAL_COMMUNITY): Admission: RE | Payer: Self-pay | Source: Other Acute Inpatient Hospital

## 2019-05-07 ENCOUNTER — Inpatient Hospital Stay (HOSPITAL_COMMUNITY)
Admission: RE | Admit: 2019-05-07 | Payer: Medicare Other | Source: Other Acute Inpatient Hospital | Admitting: Orthopedic Surgery

## 2019-05-07 LAB — TYPE AND SCREEN
ABO/RH(D): A POS
Antibody Screen: NEGATIVE

## 2019-05-07 SURGERY — ARTHROPLASTY, HIP, TOTAL, ANTERIOR APPROACH
Anesthesia: Choice | Site: Hip | Laterality: Right

## 2019-05-21 DIAGNOSIS — N3020 Other chronic cystitis without hematuria: Secondary | ICD-10-CM | POA: Diagnosis not present

## 2019-06-25 DIAGNOSIS — G4733 Obstructive sleep apnea (adult) (pediatric): Secondary | ICD-10-CM | POA: Diagnosis not present

## 2019-06-25 DIAGNOSIS — I10 Essential (primary) hypertension: Secondary | ICD-10-CM | POA: Diagnosis not present

## 2019-06-25 DIAGNOSIS — E11610 Type 2 diabetes mellitus with diabetic neuropathic arthropathy: Secondary | ICD-10-CM | POA: Diagnosis not present

## 2019-06-27 NOTE — H&P (Signed)
TOTAL HIP ADMISSION H&P  Patient is admitted for right total hip arthroplasty.  Subjective:  Chief Complaint: right hip pain  HPI: Diana Patterson, 74 y.o. female, has a history of pain and functional disability in the right hip(s) due to arthritis and patient has failed non-surgical conservative treatments for greater than 12 weeks to include corticosteriod injections and activity modification.  Onset of symptoms was gradual starting 3 years ago with gradually worsening course since that time.The patient noted no past surgery on the right hip(s).  Patient currently rates pain in the right hip at 7 out of 10 with activity. Patient has worsening of pain with activity and weight bearing and pain that interfers with activities of daily living. Patient has evidence of joint space narrowing by imaging studies. This condition presents safety issues increasing the risk of falls.  There is no current active infection.  Patient Active Problem List   Diagnosis Date Noted  . History of total bilateral knee replacement 01/25/2017  . Primary osteoarthritis of both knees 01/25/2017  . ST elevation myocardial infarction (STEMI) of inferior wall, initial episode of care ([place]) 08/23/2015  . ST elevation myocardial infarction (STEMI) of inferior wall ([place]) 08/23/2015  . Preoperative evaluation to rule out surgical contraindication 05/17/2012  . CAD (coronary artery disease), native coronary artery 12/18/2010  . Benign essential hypertension 12/18/2010  . Type 2 diabetes mellitus ([place]) 12/18/2010  . Dyslipidemia 12/18/2010  . CKD (chronic kidney disease) stage 3, GFR 30-59 ml/min 12/18/2010   Past Medical History:  Diagnosis Date  . Arthritis    RA  . CAD (coronary artery disease)    DES to circumflex June 2017, moderate residual LAD disease  . CKD (chronic kidney disease) stage 3, GFR 30-59 ml/min    Dr. Lowanda Foster  . Essential hypertension, benign   . Mixed hyperlipidemia   . NSTEMI (non-ST  elevated myocardial infarction) ([place])    Probable type II event, negative Cardiolite 2012  . TIA (transient ischemic attack)   . Type 2 diabetes mellitus ([place])     Past Surgical History:  Procedure Laterality Date  . CARDIAC CATHETERIZATION N/A 08/23/2015   Procedure: Left Heart Cath and Coronary Angiography;  Surgeon: Leonie Man, MD;  Location: [facility];  Service: Cardiovascular;  Laterality: N/A;  . CARDIAC CATHETERIZATION N/A 08/23/2015   Procedure: Coronary Stent Intervention;  Surgeon: Leonie Man, MD;  Location: [facility];  Service: Cardiovascular;  Laterality: N/A;  . CHOLECYSTECTOMY    . KNEE ARTHROPLASTY Bilateral   . Tubal pregnancy removal      No current facility-administered medications for this encounter.   Current Outpatient Medications  Medication Sig Dispense Refill Last Dose  . acetaminophen (TYLENOL) 500 MG tablet Take 1,000 mg by mouth every 6 (six) hours as needed for moderate pain.     [name] aspirin 81 MG tablet Take 81 mg by mouth daily.       [name] atorvastatin (LIPITOR) 80 MG tablet Take 1 tablet (80 mg total) by mouth daily at 6 PM. 30 tablet 5   . Calcium Carb-Cholecalciferol (CALCIUM 1000 + D) 1000-800 MG-UNIT TABS Take 1 tablet by mouth daily.      . Choline Fenofibrate (TRILIPIX) 135 MG capsule Take 135 mg by mouth daily.       [name] CRANBERRY PO Take 1 tablet by mouth daily.     [name] dexlansoprazole (DEXILANT) 60 MG capsule Take 1 capsule (60 mg total) by mouth daily. (Patient not taking: Reported  on 04/26/2019) 90 capsule 3   . folic acid (FOLVITE) 1 MG tablet Take 1 mg by mouth See admin instructions. Take 1 mg by mouth daily except for Saturday     . furosemide (LASIX) 20 MG tablet Take 20 mg by mouth daily.      . isosorbide dinitrate (ISORDIL) 30 MG tablet Take 1 tablet (30 mg total) by mouth daily. 30 tablet 5   . levocetirizine (XYZAL) 5 MG tablet Take 5 mg by mouth every evening.     [name] losartan (COZAAR) 25 MG tablet Take 25 mg by mouth  daily.     . metaxalone (SKELAXIN) 800 MG tablet Take 800 mg by mouth 2 (two) times daily.      . metFORMIN (GLUCOPHAGE) 500 MG tablet Take 500 mg by mouth 2 (two) times daily with a meal.     . methotrexate (RHEUMATREX) 2.5 MG tablet Take 20 mg by mouth every Saturday.      . metoprolol tartrate (LOPRESSOR) 50 MG tablet Take 1 tablet (50 mg total) by mouth 2 (two) times daily. 60 tablet 5   . nitroGLYCERIN (NITROSTAT) 0.4 MG SL tablet Place 1 tablet (0.4 mg total) under the tongue every 5 (five) minutes as needed for chest pain. 25 tablet 3   . Omega-3 Fatty Acids (FISH OIL) 1000 MG CAPS Take 1,000 mg by mouth 2 (two) times daily.      . Polysaccharide Iron Complex (POLY-IRON 150 PO) Take 150 mg by mouth daily.      . potassium chloride SA (K-DUR,KLOR-CON) 20 MEQ tablet Take 20 mEq by mouth daily.     . valACYclovir (VALTREX) 500 MG tablet Take 500 mg by mouth daily.      Allergies  Allergen Reactions  . Morphine And Related Other (See Comments)    Drowsiness    Social History   Tobacco Use  . Smoking status: Former Smoker    Packs/day: 1.00    Years: 20.00    Pack years: 20.00    Types: Cigarettes    Quit date: 03/22/1978    Years since quitting: 41.2  . Smokeless tobacco: Never Used  Substance Use Topics  . Alcohol use: No    Alcohol/week: 0.0 standard drinks    Family History  Problem Relation Age of Onset  . Heart attack Father   . Diabetes Sister      Review of Systems  Constitutional: Negative for chills and fever.  Respiratory: Negative for cough and shortness of breath.   Gastrointestinal: Negative for nausea and vomiting.  Musculoskeletal: Positive for arthralgias.    Objective:  Physical Exam Patient is a 74 year old female.  Well nourished and well developed. General: Alert and oriented x3, cooperative and pleasant, no acute distress. Head: normocephalic, atraumatic, neck supple. Eyes: EOMI. Respiratory: breath sounds clear in all fields, no wheezing,  rales, or rhonchi. Cardiovascular: Regular rate and rhythm, no murmurs, gallops or rubs. Abdomen: non-tender to palpation and soft, normoactive bowel sounds.  Musculoskeletal: Right Hip Exam: The range of motion: Flexion to 100 degrees, Internal Rotation to 0 degrees, External Rotation to 0 degrees, and abduction to 10 degrees. There is no tenderness over the greater trochanteric bursa.  Calves soft and nontender. Motor function intact in LE. Strength 5/5 LE bilaterally. Neuro: Distal pulses 2+. Sensation to light touch intact in LE.  Vital signs in last 24 hours: Labs:   Estimated body mass index is 31.68 kg/m as calculated from the following:   Height as of 05/03/19:  5\' 5"  (1.651 m).   Weight as of 05/03/19: 86.4 kg.   Imaging Review Plain radiographs demonstrate severe degenerative joint disease of the right hip(s). The bone quality appears to be adequate for age and reported activity level.  Assessment/Plan:  End stage arthritis, right hip(s)  The patient history, physical examination, clinical judgement of the provider and imaging studies are consistent with end stage degenerative joint disease of the right hip(s) and total hip arthroplasty is deemed medically necessary. The treatment options including medical management, injection therapy, arthroscopy and arthroplasty were discussed at length. The risks and benefits of total hip arthroplasty were presented and reviewed. The risks due to aseptic loosening, infection, stiffness, dislocation/subluxation,  thromboembolic complications and other imponderables were discussed.  The patient acknowledged the explanation, agreed to proceed with the plan and consent was signed. Patient is being admitted for inpatient treatment for surgery, pain control, PT, OT, prophylactic antibiotics, VTE prophylaxis, progressive ambulation and ADL's and discharge planning.The patient is planning to be discharged home.   Therapy Plans: HEP Disposition:  Home with daughter Planned DVT Prophylaxis: Xarelto 10mg  daily (hx of MI) DME needed: walker PCP: Dr. Sherrie Sport, clearance received Cardiologist: Dr. Domenic Polite, clearance received TXA: IV Allergies: morphine - "blacks out" Anesthesia Concerns: none BMI: 31.4 Last HgbA1c: 4.9%  Other:  **Cancelled in March due to bilateral pyelonephritis, has been cleared since then**  Hx of MI in 2018, with 2 stents. Planning to stay the night. Chronic anemia for many years, on iron supplement, last Hgb 10.2.    - Patient was instructed on what medications to stop prior to surgery. - Follow-up visit in 2 weeks with Dr. Wynelle Link - Begin physical therapy following surgery - Pre-operative lab work as pre-surgical testing - Prescriptions will be provided in hospital at time of discharge  Griffith Citron, PA-C Orthopedic Surgery [facility] [place] [phone]

## 2019-07-09 NOTE — Patient Instructions (Addendum)
DUE TO COVID-19 ONLY ONE VISITOR IS ALLOWED TO COME WITH YOU AND STAY IN THE WAITING ROOM ONLY DURING PRE OP AND PROCEDURE DAY OF SURGERY. THE 1 VISITOR MAY VISIT WITH YOU AFTER SURGERY IN YOUR PRIVATE ROOM DURING VISITING HOURS ONLY!  YOU NEED TO HAVE A COVID 19 TEST ON Sat 4/24_______ @__10 :05_____, THIS TEST MUST BE DONE BEFORE SURGERY, COME  [address], [place] , [id number].  ([place]) ONCE YOUR COVID TEST IS COMPLETED, PLEASE BEGIN THE QUARANTINE INSTRUCTIONS AS OUTLINED IN YOUR HANDOUT.                Diana Patterson    Your procedure is scheduled on: 07/18/19   Report to [facility] Main  Entrance   Report to admitting at  6:40 AM     Call this number if you have problems the morning of surgery [place], NO CHEWING GUM [place].   Do not eat food After Midnight.   YOU MAY HAVE CLEAR LIQUIDS FROM MIDNIGHT UNTIL 6:00 AM.   At 6:00 AM Please finish the prescribed Pre-Surgery Gatorade drink.   Nothing by mouth after you finish the Gatorade drink !   Take these medicines the morning of surgery with A SIP OF WATER: Isordil, Valtrex, Metoprolol,Oxybutynin  DO NOT TAKE ANY DIABETIC MEDICATIONS DAY OF YOUR SURGERY      How to Manage Your Diabetes Before and After Surgery  Why is it important to control my blood sugar before and after surgery? . Improving blood sugar levels before and after surgery helps healing and can limit problems. . A way of improving blood sugar control is eating a healthy diet by: o  Eating less sugar and carbohydrates o  Increasing activity/exercise o  Talking with your doctor about reaching your blood sugar goals . High blood sugars (greater than 180 mg/dL) can raise your risk of infections and slow your recovery, so you will need to focus on controlling your diabetes during the weeks before surgery. . Make sure that the doctor who takes  care of your diabetes knows about your planned surgery including the date and location.  How do I manage my blood sugar before surgery? . Check your blood sugar at least 4 times a day, starting 2 days before surgery, to make sure that the level is not too high or low. o Check your blood sugar the morning of your surgery when you wake up and every 2 hours until you get to the Short Stay unit. . If your blood sugar is less than 70 mg/dL, you will need to treat for low blood sugar: o Do not take insulin. o Treat a low blood sugar (less than 70 mg/dL) with  cup of clear juice (cranberry or apple), 4 glucose tablets, OR glucose gel. o Recheck blood sugar in 15 minutes after treatment (to make sure it is greater than 70 mg/dL). If your blood sugar is not greater than 70 mg/dL on recheck, call [phone] for further instructions. . Report your blood sugar to the short stay nurse when you get to Short Stay.  . If you are admitted to the hospital after surgery: o Your blood sugar will be checked by the staff and you will probably be given insulin after surgery (instead of oral diabetes medicines) to make sure you have good blood sugar levels. o The goal for blood sugar control after  surgery is 80-180 mg/dL.   WHAT DO I DO ABOUT MY DIABETES MEDICATION?  [name] Do not take oral diabetes medicines (pills) the morning of surgery.                           You may not have any metal on your body including hair pins and              piercings  Do not wear jewelry, make-up, lotions, powders or perfumes, deodorant             Do not wear nail polish on your fingernails.  Do not shave  48 hours prior to surgery.                Do not bring valuables to the hospital. [place].  Contacts, dentures or bridgework may not be worn into surgery.     HOURS.  Name and phone number of your driver:  Special Instructions: N/A              Please read over the  following fact sheets you were given: _____________________________________________________________________             [facility] - Preparing for Surgery Before surgery, you can play an important role.   Because skin is not sterile, your skin needs to be as free of germs as possible.   You can reduce the number of germs on your skin by washing with CHG (chlorahexidine gluconate) soap before surgery.   CHG is an antiseptic cleaner which kills germs and bonds with the skin to continue killing germs even after washing. Please DO NOT use if you have an allergy to CHG or antibacterial soaps.   If your skin becomes reddened/irritated stop using the CHG and inform your nurse when you arrive at Short Stay. Do not shave (including legs and underarms) for at least 48 hours prior to the first CHG shower.    Please follow these instructions carefully:  1.  Shower with CHG Soap the night before surgery and the  morning of Surgery.  2.  If you choose to wash your hair, wash your hair first as usual with your  normal  shampoo.  3.  After you shampoo, rinse your hair and body thoroughly to remove the  shampoo.                                        4.  Use CHG as you would any other liquid soap.  You can apply chg directly  to the skin and wash                       Gently with a scrungie or clean washcloth.  5.  Apply the CHG Soap to your body ONLY FROM THE NECK DOWN.   Do not use on face/ open                           Wound or open sores. Avoid contact with eyes, ears mouth and genitals (private parts).                       Wash face,  Genitals (private parts) with your normal soap.             6.  Wash thoroughly, paying special attention to the area where your surgery  will be performed.  7.  Thoroughly rinse your body with warm water from the neck down.  8.  DO NOT shower/wash with your normal soap after using and rinsing off  the CHG Soap.             9.  Pat yourself dry with a clean towel.             10.  Wear clean pajamas.            11.  Place clean sheets on your bed the night of your first shower and do not  sleep with pets. Day of Surgery : Do not apply any lotions/deodorants the morning of surgery.  Please wear clean clothes to the hospital/surgery center.  FAILURE TO FOLLOW THESE INSTRUCTIONS MAY RESULT IN THE CANCELLATION OF YOUR SURGERY PATIENT SIGNATURE_________________________________  NURSE SIGNATURE__________________________________  ________________________________________________________________________   Diana Patterson  An incentive spirometer is a tool that can help keep your lungs clear and active. This tool measures how well you are filling your lungs with each breath. Taking long deep breaths may help reverse or decrease the chance of developing breathing (pulmonary) problems (especially infection) following:  A long period of time when you are unable to move or be active. BEFORE THE PROCEDURE   If the spirometer includes an indicator to show your best effort, your nurse or respiratory therapist will set it to a desired goal.  If possible, sit up straight or lean slightly forward. Try not to slouch.  Hold the incentive spirometer in an upright position. INSTRUCTIONS FOR USE  1. Sit on the edge of your bed if possible, or sit up as far as you can in bed or on a chair. 2. Hold the incentive spirometer in an upright position. 3. Breathe out normally. 4. Place the mouthpiece in your mouth and seal your lips tightly around it. 5. Breathe in slowly and as deeply as possible, raising the piston or the ball toward the top of the column. 6. Hold your breath for 3-5 seconds or for as long as possible. Allow the piston or ball to fall to the bottom of the column. 7. Remove the mouthpiece from your mouth and breathe out normally. 8. Rest for a few seconds and repeat Steps 1 through 7 at least 10 times every 1-2 hours when you are awake. Take your time and  take a few normal breaths between deep breaths. 9. The spirometer may include an indicator to show your best effort. Use the indicator as a goal to work toward during each repetition. 10. After each set of 10 deep breaths, practice coughing to be sure your lungs are clear. If you have an incision (the cut made at the time of surgery), support your incision when coughing by placing a pillow or rolled up towels firmly against it. Once you are able to get out of bed, walk around indoors and cough well. You may stop using the incentive spirometer when instructed by your caregiver.  RISKS AND COMPLICATIONS  Take your time so you do not get dizzy or light-headed.  If you are in pain, you may need to take or ask for pain medication before doing incentive spirometry. It is harder to take a deep breath if you are having pain. AFTER USE  Rest and  breathe slowly and easily.  It can be helpful to keep track of a log of your progress. Your caregiver can provide you with a simple table to help with this. If you are using the spirometer at home, follow these instructions: [place] IF:   You are having difficultly using the spirometer.  You have trouble using the spirometer as often as instructed.  Your pain medication is not giving enough relief while using the spirometer.  You develop fever of 100.5 F (38.1 C) or higher. SEEK IMMEDIATE MEDICAL CARE IF:   You cough up bloody sputum that had not been present before.  You develop fever of 102 F (38.9 C) or greater.  You develop worsening pain at or near the incision site. MAKE SURE YOU:   Understand these instructions.  Will watch your condition.  Will get help right away if you are not doing well or get worse. Document Released: 07/19/2006 Document Revised: 05/31/2011 Document Reviewed: 09/19/2006 Exit[employer] Patient Information 2014 Exit[employer],  [place].   ________________________________________________________________________  WHAT IS A BLOOD TRANSFUSION? Blood Transfusion Information  A transfusion is the replacement of blood or some of its parts. Blood is made up of multiple cells which provide different functions.  Red blood cells carry oxygen and are used for blood loss replacement.  White blood cells fight against infection.  Platelets control bleeding.  Plasma helps clot blood.  Other blood products are available for specialized needs, such as hemophilia or other clotting disorders. BEFORE THE TRANSFUSION  Who gives blood for transfusions?   Healthy volunteers who are fully evaluated to make sure their blood is safe. This is blood bank blood. Transfusion therapy is the safest it has ever been in the practice of medicine. Before blood is taken from a donor, a complete history is taken to make sure that person has no history of diseases nor engages in risky social behavior (examples are intravenous drug use or sexual activity with multiple partners). The donor's travel history is screened to minimize risk of transmitting infections, such as malaria. The donated blood is tested for signs of infectious diseases, such as HIV and hepatitis. The blood is then tested to be sure it is compatible with you in order to minimize the chance of a transfusion reaction. If you or a relative donates blood, this is often done in anticipation of surgery and is not appropriate for emergency situations. It takes many days to process the donated blood. RISKS AND COMPLICATIONS Although transfusion therapy is very safe and saves many lives, the main dangers of transfusion include:   Getting an infectious disease.  Developing a transfusion reaction. This is an allergic reaction to something in the blood you were given. Every precaution is taken to prevent this. The decision to have a blood transfusion has been considered carefully by your caregiver  before blood is given. Blood is not given unless the benefits outweigh the risks. AFTER THE TRANSFUSION  Right after receiving a blood transfusion, you will usually feel much better and more energetic. This is especially true if your red blood cells have gotten low (anemic). The transfusion raises the level of the red blood cells which carry oxygen, and this usually causes an energy increase.  The nurse administering the transfusion will monitor you carefully for complications. HOME CARE INSTRUCTIONS  No special instructions are needed after a transfusion. You may find your energy is better. Speak with your caregiver about any limitations on activity for underlying diseases you may have. [place]  CARE IF:   Your condition is not improving after your transfusion.  You develop redness or irritation at the intravenous (IV) site. SEEK IMMEDIATE MEDICAL CARE IF:  Any of the following symptoms occur over the next 12 hours:  Shaking chills.  You have a temperature by mouth above 102 F (38.9 C), not controlled by medicine.  Chest, back, or muscle pain.  People around you feel you are not acting correctly or are confused.  Shortness of breath or difficulty breathing.  Dizziness and fainting.  You get a rash or develop hives.  You have a decrease in urine output.  Your urine turns a dark color or changes to pink, red, or brown. Any of the following symptoms occur over the next 10 days:  You have a temperature by mouth above 102 F (38.9 C), not controlled by medicine.  Shortness of breath.  Weakness after normal activity.  The white part of the eye turns yellow (jaundice).  You have a decrease in the amount of urine or are urinating less often.  Your urine turns a dark color or changes to pink, red, or brown. Document Released: 03/05/2000 Document Revised: 05/31/2011 Document Reviewed: 10/23/2007 [facility] Patient Information 2014 [place],  [place].  _______________________________________________________________________

## 2019-07-10 ENCOUNTER — Encounter (HOSPITAL_COMMUNITY): Payer: Self-pay

## 2019-07-10 ENCOUNTER — Encounter (HOSPITAL_COMMUNITY)
Admission: RE | Admit: 2019-07-10 | Discharge: 2019-07-10 | Disposition: A | Discharge status: Home / Self Care | Payer: Medicare Other | Source: Ambulatory Visit | Attending: Orthopedic Surgery | Admitting: Orthopedic Surgery

## 2019-07-10 ENCOUNTER — Other Ambulatory Visit: Payer: Self-pay

## 2019-07-10 DIAGNOSIS — Z01812 Encounter for preprocedural laboratory examination: Secondary | ICD-10-CM | POA: Diagnosis not present

## 2019-07-10 HISTORY — DX: Sleep apnea, unspecified: G47.30

## 2019-07-10 LAB — COMPREHENSIVE METABOLIC PANEL
ALT: 26 U/L (ref 0–44)
AST: 43 U/L — ABNORMAL HIGH (ref 15–41)
Albumin: 3.5 g/dL (ref 3.5–5.0)
Alkaline Phosphatase: 109 U/L (ref 38–126)
Anion gap: 10 (ref 5–15)
BUN: 18 mg/dL (ref 8–23)
CO2: 24 mmol/L (ref 22–32)
Calcium: 9.4 mg/dL (ref 8.9–10.3)
Chloride: 107 mmol/L (ref 98–111)
Creatinine, Ser: 1.13 mg/dL — ABNORMAL HIGH (ref 0.44–1.00)
GFR calc Af Amer: 55 mL/min — ABNORMAL LOW (ref >60)
GFR calc non Af Amer: 48 mL/min — ABNORMAL LOW (ref >60)
Glucose, Bld: 106 mg/dL — ABNORMAL HIGH (ref 70–99)
Potassium: 4.5 mmol/L (ref 3.5–5.1)
Sodium: 141 mmol/L (ref 135–145)
Total Bilirubin: 0.8 mg/dL (ref 0.3–1.2)
Total Protein: 6.9 g/dL (ref 6.5–8.1)

## 2019-07-10 LAB — HEMOGLOBIN A1C
Hgb A1c MFr Bld: 5.1 % (ref 4.8–5.6)
Mean Plasma Glucose: 99.67 mg/dL

## 2019-07-10 LAB — CBC
HCT: 29.6 % — ABNORMAL LOW (ref 36.0–46.0)
Hemoglobin: 9.9 g/dL — ABNORMAL LOW (ref 12.0–15.0)
MCH: 32.7 pg (ref 26.0–34.0)
MCHC: 33.4 g/dL (ref 30.0–36.0)
MCV: 97.7 fL (ref 80.0–100.0)
Platelets: 268 10*3/uL (ref 150–400)
RBC: 3.03 MIL/uL — ABNORMAL LOW (ref 3.87–5.11)
RDW: 16.5 % — ABNORMAL HIGH (ref 11.5–15.5)
WBC: 5.1 10*3/uL (ref 4.0–10.5)
nRBC: 0 % (ref 0.0–0.2)

## 2019-07-10 LAB — PROTIME-INR
INR: 1.2 (ref 0.8–1.2)
Prothrombin Time: 15.2 seconds (ref 11.4–15.2)

## 2019-07-10 LAB — APTT: aPTT: 28 seconds (ref 24–36)

## 2019-07-10 LAB — SURGICAL PCR SCREEN
MRSA, PCR: NEGATIVE
Staphylococcus aureus: POSITIVE — AB

## 2019-07-10 LAB — GLUCOSE, CAPILLARY: Glucose-Capillary: 96 mg/dL (ref 70–99)

## 2019-07-10 NOTE — Progress Notes (Signed)
PCP - Dr. Nemiah Commander Cardiologist - Dr. Domenic Polite  Chest x-ray - 11/12/18 EKG - 11/12/18 Stress Test - no ECHO - 11/14/18 Cardiac Cath - 2017  Sleep Study - yes CPAP - no longer uses because of wt loss.  Fasting Blood Sugar - 112-140 Checks Blood Sugar _____ times a day  1/wk  Blood Thinner Instructions:ASA Aspirin Instructions:Stop 5 days prior to DOS Last Dose:07/13/19  Anesthesia review:   Patient denies shortness of breath, fever, cough and chest pain at PAT appointment yes  Patient verbalized understanding of instructions that were given to them at the PAT appointment. Patient was also instructed that they will need to review over the PAT instructions again at home before surgery. yes

## 2019-07-10 NOTE — Progress Notes (Signed)
PCR results routed to Dr. Anne Fu office for review

## 2019-07-14 ENCOUNTER — Other Ambulatory Visit (HOSPITAL_COMMUNITY): Payer: Medicare Other

## 2019-07-16 DIAGNOSIS — R970 Elevated carcinoembryonic antigen [CEA]: Secondary | ICD-10-CM | POA: Diagnosis not present

## 2019-07-16 DIAGNOSIS — N3020 Other chronic cystitis without hematuria: Secondary | ICD-10-CM | POA: Diagnosis not present

## 2019-07-16 DIAGNOSIS — R630 Anorexia: Secondary | ICD-10-CM | POA: Diagnosis not present

## 2019-07-16 DIAGNOSIS — R634 Abnormal weight loss: Secondary | ICD-10-CM | POA: Diagnosis not present

## 2019-07-16 DIAGNOSIS — R5383 Other fatigue: Secondary | ICD-10-CM | POA: Diagnosis not present

## 2019-07-18 ENCOUNTER — Encounter (HOSPITAL_COMMUNITY): Admission: RE | Payer: Self-pay | Source: Ambulatory Visit

## 2019-07-18 ENCOUNTER — Inpatient Hospital Stay (HOSPITAL_COMMUNITY): Admission: RE | Admit: 2019-07-18 | Payer: Medicare Other | Source: Ambulatory Visit | Admitting: Orthopedic Surgery

## 2019-07-18 LAB — TYPE AND SCREEN
ABO/RH(D): A POS
Antibody Screen: NEGATIVE

## 2019-07-18 SURGERY — ARTHROPLASTY, HIP, TOTAL, ANTERIOR APPROACH
Anesthesia: Choice | Site: Hip | Laterality: Right

## 2019-07-18 NOTE — Progress Notes (Signed)
Cardiology Office Note  Date: 07/19/2019   ID: Dayle, Sloboda 10-25-1945, MRN [id number]  PCP:  Neale Burly, MD  Cardiologist:  Rozann Lesches, MD Electrophysiologist:  None    Chief Complaint: f/u lightheadeness / near syncope, HTN, CAD, CKD III  History of Present Illness: Diana Patterson is a 74 y.o. female with a history of lightheadeness and near syncope, HTN, CAD (DES to Lcx 2017), CKD III.  Previous hospitalization 10/2018 for lightheadedness and near syncope. Had low BP and given IV fluids. Carotid studies minimal plaque, CXR negative, EKG NSR, Echo EF > 55%, Trop T neg. BNP normal.   Last encounter with Dr. Domenic Polite 01/16/2019. Had no subsequent near syncopal episodes, palpitations, exertional CP. Having arthritis issues with R hip. Seeing ortho and anticipating surgery. Plans were to get a cardiac monitor if further episodes of near syncope. BP was normal, last Crt 1.63. follows with nephrology. Residual LAD disease medically managed.  Patient states she has been doing well from a cardiac standpoint she denies any further episodes of lightheadedness or near syncope.  She is recently discovered she has some significant anemia and has been having black tarry stools.  Also complaining of early satiety along with some nausea and vomiting when eating.  Her daughter who is with her states this has been going on for a while.  She was scheduled to have right total hip replacement done by Dr. Wynelle Link.  States this has been postponed 3 times recently.  She states the first postponement was due to her husband having a stroke.  The second postponement was due to bilateral kidney infections.  The current postponement is secondary to significant anemia.  She denies any progressive anginal or exertional symptoms, palpitations or arrhythmias, claudication-like symptoms.  She does have bilateral lower extremity edema for which she takes Lasix 20 mg daily.  She also wears  compression stockings during the day.  She follows with Dr. Theador Hawthorne for her renal disease.  She is scheduled to have both an EGD and a colonoscopy soon with GI in [place].  She states along with the black tarry stools  she has lost around 60 pounds over the last year as well as having issues with lack of appetite and early satiety.  She states once this anemia is corrected she may be able to proceed with her hip replacement surgery.   Past Medical History:  Diagnosis Date  . Anginal pain ([place])   . Arthritis    RA  . CAD (coronary artery disease)    DES to circumflex June 2017, moderate residual LAD disease  . CKD (chronic kidney disease) stage 3, GFR 30-59 ml/min    Dr. Lowanda Foster  . Essential hypertension, benign   . Mixed hyperlipidemia   . NSTEMI (non-ST elevated myocardial infarction) ([place]) 2016   Probable type II event, negative Cardiolite 2012  . Sleep apnea   . TIA (transient ischemic attack)   . Type 2 diabetes mellitus ([place])     Past Surgical History:  Procedure Laterality Date  . CARDIAC CATHETERIZATION N/A 08/23/2015   Procedure: Left Heart Cath and Coronary Angiography;  Surgeon: Leonie Man, MD;  Location: [facility];  Service: Cardiovascular;  Laterality: N/A;  . CARDIAC CATHETERIZATION N/A 08/23/2015   Procedure: Coronary Stent Intervention;  Surgeon: Leonie Man, MD;  Location: [facility];  Service: Cardiovascular;  Laterality: N/A;  . CHOLECYSTECTOMY    . EYE SURGERY  1990s  . KNEE ARTHROPLASTY  Bilateral   . Tubal pregnancy removal      Current Outpatient Medications  Medication Sig Dispense Refill  . acetaminophen (TYLENOL) 650 MG CR tablet Take 1,300 mg by mouth in the morning and at bedtime.    [name] aspirin 81 MG tablet Take 81 mg by mouth daily.      [name] atorvastatin (LIPITOR) 80 MG tablet Take 1 tablet (80 mg total) by mouth daily at 6 PM. 30 tablet 5  . Calcium Carb-Cholecalciferol (CALCIUM 1000 + D) 1000-800 MG-UNIT TABS Take 1 tablet by  mouth daily.     . Choline Fenofibrate (TRILIPIX) 135 MG capsule Take 135 mg by mouth daily.      [name] dexlansoprazole (DEXILANT) 60 MG capsule Take 1 capsule (60 mg total) by mouth daily. 90 capsule 3  . Dexlansoprazole 30 MG capsule Take 30 mg by mouth daily.    . diclofenac Sodium (VOLTAREN) 1 % GEL Apply 2 g topically in the morning and at bedtime.    . folic acid (FOLVITE) 1 MG tablet Take 1 mg by mouth See admin instructions. Take 1 mg by mouth daily except for Saturday    . furosemide (LASIX) 20 MG tablet Take 20 mg by mouth daily.     . isosorbide dinitrate (ISORDIL) 30 MG tablet Take 1 tablet (30 mg total) by mouth daily. 30 tablet 5  . levocetirizine (XYZAL) 5 MG tablet Take 5 mg by mouth every evening.    [name] losartan (COZAAR) 25 MG tablet Take 25 mg by mouth daily.    . metaxalone (SKELAXIN) 800 MG tablet Take 800 mg by mouth 2 (two) times daily.     . metFORMIN (GLUCOPHAGE) 500 MG tablet Take 500 mg by mouth 2 (two) times daily with a meal.    . methotrexate (RHEUMATREX) 2.5 MG tablet Take 20 mg by mouth every Saturday.     . metoprolol tartrate (LOPRESSOR) 50 MG tablet Take 1 tablet (50 mg total) by mouth 2 (two) times daily. 60 tablet 5  . nitroGLYCERIN (NITROSTAT) 0.4 MG SL tablet Place 1 tablet (0.4 mg total) under the tongue every 5 (five) minutes as needed for chest pain. 25 tablet 3  . Omega-3 Fatty Acids (FISH OIL) 1000 MG CAPS Take 1,000 mg by mouth 2 (two) times daily.     [name] oxybutynin (DITROPAN-XL) 10 MG 24 hr tablet Take 10 mg by mouth daily.    . Polysaccharide Iron Complex (POLY-IRON 150 PO) Take 150 mg by mouth daily.     . potassium chloride SA (K-DUR,KLOR-CON) 20 MEQ tablet Take 20 mEq by mouth daily.    . traMADol (ULTRAM) 50 MG tablet Take 1-2 tablets by mouth 2 (two) times daily as needed.    . valACYclovir (VALTREX) 500 MG tablet Take 500 mg by mouth daily.     No current facility-administered medications for this visit.   Allergies:  Morphine and related    Social History: The patient  reports that she quit smoking about 41 years ago. Her smoking use included cigarettes. She has a 20.00 pack-year smoking history. She has never used smokeless tobacco. She reports that she does not drink alcohol or use drugs.   Family History: The patient's family history includes Diabetes in her sister; Heart attack in her father.   ROS:  Please see the history of present illness. Otherwise, complete review of systems is positive for none.  All other systems are reviewed and negative.   Physical Exam: VS:  BP 136/70  Pulse 62   Ht 5\' 6"  (1.676 m)   Wt 183 lb 3.2 oz (83.1 kg)   SpO2 98%   BMI 29.57 kg/m , BMI Body mass index is 29.57 kg/m.  Wt Readings from Last 3 Encounters:  07/19/19 183 lb 3.2 oz (83.1 kg)  07/10/19 186 lb 4 oz (84.5 kg)  07/10/19 186 lb (84.4 kg)    General: Patient appears comfortable at rest. Neck: Supple, no elevated JVP or carotid bruits, no thyromegaly. Lungs: Clear to auscultation, nonlabored breathing at rest. Cardiac: Regular rate and rhythm, no S3 or significant systolic murmur, no pericardial rub. Extremities: Bilateral 1+ pitting edema, distal pulses 2+. Skin: Warm and dry. Musculoskeletal: No kyphosis. Neuropsychiatric: Alert and oriented x3, affect grossly appropriate.  ECG:  An ECG dated 12/28/2018 was personally reviewed today and demonstrated:  Sinus rhythm rate of 62  Recent Labwork: 07/10/2019: ALT 26; AST 43; BUN 18; Creatinine, Ser 1.13; Hemoglobin 9.9; Platelets 268; Potassium 4.5; Sodium 141  No results found for: CHOL, TRIG, HDL, CHOLHDL, VLDL, LDLCALC, LDLDIRECT  Other Studies Reviewed Today: Cardiac catheterization 08/23/2015:  Prox Cx to Mid Cx lesion, 90% stenosed. Post intervention aspiration thrombectomy followed by Promus Premier DES 3.5 mm x 12 mm (3.75 mm) , there is a 0% residual stenosis.  Prox LAD-2 lesion, 50-75% stenosed.  The left ventricular systolic function is normal.  Elevated  LVEDP  Likely culprit lesion was the thrombotic stenosis of the circumflex. It would appear that there was reperfusion after heparin and aspirin was given in the emergency room. This would be consistent with her improved chest pain upon arrival.  Successful PCI of the proximal dominant Circumflex. There is residual disease in the proximal-mid LAD prior to D1. This does not appear to be flow-limiting, however would probably benefit from being investigated. I would determine course of action based on her symptoms. If she is a symptomatically, could consider discharge with plans for outpatient Myoview to basket the LAD, otherwise if she has symptoms of angina following this PCI, I would proceed with staged PCI of the LAD prior to her discharge.  Echocardiogram 08/25/2015: Study Conclusions  - Left ventricle: The cavity size was normal. Wall thickness was  increased in a pattern of mild LVH. Systolic function was normal.  The estimated ejection fraction was in the range of 60% to 65%.  Wall motion was normal; there were no regional wall motion  abnormalities. Doppler parameters are consistent with abnormal  left ventricular relaxation (grade 1 diastolic dysfunction). - Mitral valve: Mildly calcified annulus.  LexiscanMyoview 11/11/2015:  No diagnostic ST segment changes to indicate ischemia.  Small, mild intensity, apical anterior defect that is predominantly fixed and most consistent with variable soft tissue attenuation. No large degree of anterior ischemia noted.  This is a low risk study.  Nuclear stress EF: 54%.   Assessment and Plan:  1. Near syncope   2. CAD in native artery   3. Essential hypertension   4. Stage 3b chronic kidney disease    1. Near syncope Patient states she has had no further episodes of significant dizziness or near syncope.  She has a recent diagnosis of significant anemia which may have contributed to her feelings of dizziness and syncope.  Her  daughter states this has been going on for quite a while with dark tarry stools, early satiety, lack of appetite, and weight loss.  She is scheduled for EGD and colonoscopy with GI in [place]  2. CAD in native artery She denies  any progressive anginal or exertional symptoms.  She is compliant with all of her cardiac medications.  She has not had to use any sublingual nitroglycerin.  Continue aspirin 81 mg, sublingual nitroglycerin as needed, Imdur 30 mg daily, metoprolol 50 mg p.o. twice daily.  3. Essential hypertension Blood pressure has been reasonably well controlled with blood pressure today of 136/70.  Continue losartan 25 mg daily.  4. Stage 3b chronic kidney disease Patient states she has had recent bilateral kidney infections.  States her kidney function has decreased and she likely needs to go see her nephrologist Dr. Theador Hawthorne.  Last renal function 05/07/2019 showed a creatinine of 1.16 and GFR of 55.  5.  GI bleed Patient was recently noted to be significantly anemic with a hemoglobin around 7.  She has had recent dark tarry stools over the last several months.  She has issues with swallowing difficulty, nausea, early satiety, loss of appetite and weight loss of approximately 60 pounds over the last year.  She is scheduled to have both an EGD and a colonoscopy by GI in [place] soon.  Last H&H on 05/07/2019; 9.2 and 27.3   Medication Adjustments/Labs and Tests Ordered: Current medicines are reviewed at length with the patient today.  Concerns regarding medicines are outlined above.   Disposition: Follow-up with Dr Domenic Polite or APP 6 months  Signed, Levell July, NP 07/19/2019 11:44 AM    [facility], [place], [place] [address] Phone: [phone]; Fax: [phone]

## 2019-07-19 ENCOUNTER — Ambulatory Visit (INDEPENDENT_AMBULATORY_CARE_PROVIDER_SITE_OTHER): Payer: Medicare Other | Admitting: Family Medicine

## 2019-07-19 ENCOUNTER — Encounter: Payer: Self-pay | Admitting: Family Medicine

## 2019-07-19 ENCOUNTER — Other Ambulatory Visit: Payer: Self-pay

## 2019-07-19 VITALS — BP 136/70 | HR 62 | Ht 66.0 in | Wt 183.2 lb

## 2019-07-19 DIAGNOSIS — N3020 Other chronic cystitis without hematuria: Secondary | ICD-10-CM | POA: Diagnosis not present

## 2019-07-19 DIAGNOSIS — R55 Syncope and collapse: Secondary | ICD-10-CM | POA: Diagnosis not present

## 2019-07-19 DIAGNOSIS — I700 Atherosclerosis of aorta: Secondary | ICD-10-CM | POA: Diagnosis not present

## 2019-07-19 DIAGNOSIS — K922 Gastrointestinal hemorrhage, unspecified: Secondary | ICD-10-CM

## 2019-07-19 DIAGNOSIS — I2510 Atherosclerotic heart disease of native coronary artery without angina pectoris: Secondary | ICD-10-CM

## 2019-07-19 DIAGNOSIS — I10 Essential (primary) hypertension: Secondary | ICD-10-CM

## 2019-07-19 DIAGNOSIS — M47816 Spondylosis without myelopathy or radiculopathy, lumbar region: Secondary | ICD-10-CM | POA: Diagnosis not present

## 2019-07-19 DIAGNOSIS — N1832 Chronic kidney disease, stage 3b: Secondary | ICD-10-CM

## 2019-07-19 DIAGNOSIS — M160 Bilateral primary osteoarthritis of hip: Secondary | ICD-10-CM | POA: Diagnosis not present

## 2019-07-19 DIAGNOSIS — N200 Calculus of kidney: Secondary | ICD-10-CM | POA: Diagnosis not present

## 2019-07-19 NOTE — Patient Instructions (Addendum)

## 2019-07-23 DIAGNOSIS — K921 Melena: Secondary | ICD-10-CM | POA: Diagnosis not present

## 2019-07-30 DIAGNOSIS — E11610 Type 2 diabetes mellitus with diabetic neuropathic arthropathy: Secondary | ICD-10-CM | POA: Diagnosis not present

## 2019-07-30 DIAGNOSIS — G4733 Obstructive sleep apnea (adult) (pediatric): Secondary | ICD-10-CM | POA: Diagnosis not present

## 2019-07-30 DIAGNOSIS — I10 Essential (primary) hypertension: Secondary | ICD-10-CM | POA: Diagnosis not present

## 2019-07-31 DIAGNOSIS — Z01818 Encounter for other preprocedural examination: Secondary | ICD-10-CM | POA: Diagnosis not present

## 2019-08-02 DIAGNOSIS — I10 Essential (primary) hypertension: Secondary | ICD-10-CM | POA: Diagnosis not present

## 2019-08-02 DIAGNOSIS — Z955 Presence of coronary angioplasty implant and graft: Secondary | ICD-10-CM | POA: Diagnosis not present

## 2019-08-02 DIAGNOSIS — K921 Melena: Secondary | ICD-10-CM | POA: Diagnosis not present

## 2019-08-02 DIAGNOSIS — E785 Hyperlipidemia, unspecified: Secondary | ICD-10-CM | POA: Diagnosis not present

## 2019-08-02 DIAGNOSIS — M1990 Unspecified osteoarthritis, unspecified site: Secondary | ICD-10-CM | POA: Diagnosis not present

## 2019-08-02 DIAGNOSIS — Z7982 Long term (current) use of aspirin: Secondary | ICD-10-CM | POA: Diagnosis not present

## 2019-08-02 DIAGNOSIS — Z7984 Long term (current) use of oral hypoglycemic drugs: Secondary | ICD-10-CM | POA: Diagnosis not present

## 2019-08-02 DIAGNOSIS — Z7952 Long term (current) use of systemic steroids: Secondary | ICD-10-CM | POA: Diagnosis not present

## 2019-08-02 DIAGNOSIS — Q438 Other specified congenital malformations of intestine: Secondary | ICD-10-CM | POA: Diagnosis not present

## 2019-08-02 DIAGNOSIS — Z886 Allergy status to analgesic agent status: Secondary | ICD-10-CM | POA: Diagnosis not present

## 2019-08-02 DIAGNOSIS — E119 Type 2 diabetes mellitus without complications: Secondary | ICD-10-CM | POA: Diagnosis not present

## 2019-08-02 DIAGNOSIS — I2510 Atherosclerotic heart disease of native coronary artery without angina pectoris: Secondary | ICD-10-CM | POA: Diagnosis not present

## 2019-08-02 DIAGNOSIS — K219 Gastro-esophageal reflux disease without esophagitis: Secondary | ICD-10-CM | POA: Diagnosis not present

## 2019-08-02 DIAGNOSIS — I252 Old myocardial infarction: Secondary | ICD-10-CM | POA: Diagnosis not present

## 2019-08-02 DIAGNOSIS — K641 Second degree hemorrhoids: Secondary | ICD-10-CM | POA: Diagnosis not present

## 2019-08-02 DIAGNOSIS — K644 Residual hemorrhoidal skin tags: Secondary | ICD-10-CM | POA: Diagnosis not present

## 2019-08-02 DIAGNOSIS — K2950 Unspecified chronic gastritis without bleeding: Secondary | ICD-10-CM | POA: Diagnosis not present

## 2019-08-02 DIAGNOSIS — Z9049 Acquired absence of other specified parts of digestive tract: Secondary | ICD-10-CM | POA: Diagnosis not present

## 2019-08-02 DIAGNOSIS — Z96653 Presence of artificial knee joint, bilateral: Secondary | ICD-10-CM | POA: Diagnosis not present

## 2019-08-15 DIAGNOSIS — B9681 Helicobacter pylori [H. pylori] as the cause of diseases classified elsewhere: Secondary | ICD-10-CM | POA: Diagnosis not present

## 2019-08-15 DIAGNOSIS — K2970 Gastritis, unspecified, without bleeding: Secondary | ICD-10-CM | POA: Diagnosis not present

## 2019-08-21 DIAGNOSIS — K2970 Gastritis, unspecified, without bleeding: Secondary | ICD-10-CM | POA: Diagnosis not present

## 2019-08-24 DIAGNOSIS — I10 Essential (primary) hypertension: Secondary | ICD-10-CM | POA: Diagnosis not present

## 2019-08-24 DIAGNOSIS — G4733 Obstructive sleep apnea (adult) (pediatric): Secondary | ICD-10-CM | POA: Diagnosis not present

## 2019-08-24 DIAGNOSIS — E11610 Type 2 diabetes mellitus with diabetic neuropathic arthropathy: Secondary | ICD-10-CM | POA: Diagnosis not present

## 2019-08-28 DIAGNOSIS — M79675 Pain in left toe(s): Secondary | ICD-10-CM | POA: Diagnosis not present

## 2019-08-28 DIAGNOSIS — M79672 Pain in left foot: Secondary | ICD-10-CM | POA: Diagnosis not present

## 2019-08-28 DIAGNOSIS — I739 Peripheral vascular disease, unspecified: Secondary | ICD-10-CM | POA: Diagnosis not present

## 2019-08-28 DIAGNOSIS — E1140 Type 2 diabetes mellitus with diabetic neuropathy, unspecified: Secondary | ICD-10-CM | POA: Diagnosis not present

## 2019-08-28 DIAGNOSIS — M79674 Pain in right toe(s): Secondary | ICD-10-CM | POA: Diagnosis not present

## 2019-08-28 DIAGNOSIS — B353 Tinea pedis: Secondary | ICD-10-CM | POA: Diagnosis not present

## 2019-08-28 DIAGNOSIS — M79671 Pain in right foot: Secondary | ICD-10-CM | POA: Diagnosis not present

## 2019-10-10 DIAGNOSIS — K2970 Gastritis, unspecified, without bleeding: Secondary | ICD-10-CM | POA: Diagnosis not present

## 2019-10-10 DIAGNOSIS — B9681 Helicobacter pylori [H. pylori] as the cause of diseases classified elsewhere: Secondary | ICD-10-CM | POA: Diagnosis not present

## 2019-10-10 DIAGNOSIS — R1084 Generalized abdominal pain: Secondary | ICD-10-CM | POA: Diagnosis not present

## 2019-10-17 DIAGNOSIS — Z9049 Acquired absence of other specified parts of digestive tract: Secondary | ICD-10-CM | POA: Diagnosis not present

## 2019-10-17 DIAGNOSIS — I119 Hypertensive heart disease without heart failure: Secondary | ICD-10-CM | POA: Diagnosis not present

## 2019-10-17 DIAGNOSIS — I252 Old myocardial infarction: Secondary | ICD-10-CM | POA: Diagnosis not present

## 2019-10-17 DIAGNOSIS — R197 Diarrhea, unspecified: Secondary | ICD-10-CM | POA: Diagnosis not present

## 2019-10-17 DIAGNOSIS — E119 Type 2 diabetes mellitus without complications: Secondary | ICD-10-CM | POA: Diagnosis not present

## 2019-10-17 DIAGNOSIS — Z79899 Other long term (current) drug therapy: Secondary | ICD-10-CM | POA: Diagnosis not present

## 2019-10-17 DIAGNOSIS — R0682 Tachypnea, not elsewhere classified: Secondary | ICD-10-CM | POA: Diagnosis not present

## 2019-10-17 DIAGNOSIS — Z885 Allergy status to narcotic agent status: Secondary | ICD-10-CM | POA: Diagnosis not present

## 2019-10-17 DIAGNOSIS — E860 Dehydration: Secondary | ICD-10-CM | POA: Diagnosis not present

## 2019-10-17 DIAGNOSIS — R531 Weakness: Secondary | ICD-10-CM | POA: Diagnosis not present

## 2019-10-17 DIAGNOSIS — R1110 Vomiting, unspecified: Secondary | ICD-10-CM | POA: Diagnosis not present

## 2019-10-17 DIAGNOSIS — R030 Elevated blood-pressure reading, without diagnosis of hypertension: Secondary | ICD-10-CM | POA: Diagnosis not present

## 2019-10-17 DIAGNOSIS — D649 Anemia, unspecified: Secondary | ICD-10-CM | POA: Diagnosis not present

## 2019-10-17 DIAGNOSIS — Z7982 Long term (current) use of aspirin: Secondary | ICD-10-CM | POA: Diagnosis not present

## 2019-10-22 DIAGNOSIS — N182 Chronic kidney disease, stage 2 (mild): Secondary | ICD-10-CM | POA: Diagnosis not present

## 2019-10-22 DIAGNOSIS — D649 Anemia, unspecified: Secondary | ICD-10-CM | POA: Diagnosis not present

## 2019-10-22 DIAGNOSIS — K2970 Gastritis, unspecified, without bleeding: Secondary | ICD-10-CM | POA: Diagnosis not present

## 2019-11-06 DIAGNOSIS — N182 Chronic kidney disease, stage 2 (mild): Secondary | ICD-10-CM | POA: Diagnosis not present

## 2019-11-06 DIAGNOSIS — I10 Essential (primary) hypertension: Secondary | ICD-10-CM | POA: Diagnosis not present

## 2019-11-06 DIAGNOSIS — E11610 Type 2 diabetes mellitus with diabetic neuropathic arthropathy: Secondary | ICD-10-CM | POA: Diagnosis not present

## 2019-11-13 DIAGNOSIS — M79671 Pain in right foot: Secondary | ICD-10-CM | POA: Diagnosis not present

## 2019-11-13 DIAGNOSIS — M79675 Pain in left toe(s): Secondary | ICD-10-CM | POA: Diagnosis not present

## 2019-11-13 DIAGNOSIS — I739 Peripheral vascular disease, unspecified: Secondary | ICD-10-CM | POA: Diagnosis not present

## 2019-11-13 DIAGNOSIS — E1140 Type 2 diabetes mellitus with diabetic neuropathy, unspecified: Secondary | ICD-10-CM | POA: Diagnosis not present

## 2019-11-13 DIAGNOSIS — M79672 Pain in left foot: Secondary | ICD-10-CM | POA: Diagnosis not present

## 2019-11-13 DIAGNOSIS — B353 Tinea pedis: Secondary | ICD-10-CM | POA: Diagnosis not present

## 2019-11-13 DIAGNOSIS — M79674 Pain in right toe(s): Secondary | ICD-10-CM | POA: Diagnosis not present

## 2019-11-28 DIAGNOSIS — E11610 Type 2 diabetes mellitus with diabetic neuropathic arthropathy: Secondary | ICD-10-CM | POA: Diagnosis not present

## 2019-11-28 DIAGNOSIS — I10 Essential (primary) hypertension: Secondary | ICD-10-CM | POA: Diagnosis not present

## 2019-11-28 DIAGNOSIS — N182 Chronic kidney disease, stage 2 (mild): Secondary | ICD-10-CM | POA: Diagnosis not present

## 2019-12-05 DIAGNOSIS — Z79899 Other long term (current) drug therapy: Secondary | ICD-10-CM | POA: Diagnosis not present

## 2019-12-05 DIAGNOSIS — E559 Vitamin D deficiency, unspecified: Secondary | ICD-10-CM | POA: Diagnosis not present

## 2019-12-05 DIAGNOSIS — R809 Proteinuria, unspecified: Secondary | ICD-10-CM | POA: Diagnosis not present

## 2019-12-05 DIAGNOSIS — N1831 Chronic kidney disease, stage 3a: Secondary | ICD-10-CM | POA: Diagnosis not present

## 2019-12-05 DIAGNOSIS — E876 Hypokalemia: Secondary | ICD-10-CM | POA: Diagnosis not present

## 2019-12-17 DIAGNOSIS — M25551 Pain in right hip: Secondary | ICD-10-CM | POA: Diagnosis not present

## 2019-12-17 DIAGNOSIS — N182 Chronic kidney disease, stage 2 (mild): Secondary | ICD-10-CM | POA: Diagnosis not present

## 2019-12-17 DIAGNOSIS — K2970 Gastritis, unspecified, without bleeding: Secondary | ICD-10-CM | POA: Diagnosis not present

## 2019-12-17 DIAGNOSIS — E11319 Type 2 diabetes mellitus with unspecified diabetic retinopathy without macular edema: Secondary | ICD-10-CM | POA: Diagnosis not present

## 2019-12-17 DIAGNOSIS — D649 Anemia, unspecified: Secondary | ICD-10-CM | POA: Diagnosis not present

## 2019-12-17 DIAGNOSIS — M25559 Pain in unspecified hip: Secondary | ICD-10-CM | POA: Diagnosis not present

## 2019-12-17 DIAGNOSIS — E785 Hyperlipidemia, unspecified: Secondary | ICD-10-CM | POA: Diagnosis not present

## 2019-12-17 DIAGNOSIS — E119 Type 2 diabetes mellitus without complications: Secondary | ICD-10-CM | POA: Diagnosis not present

## 2019-12-20 ENCOUNTER — Encounter (INDEPENDENT_AMBULATORY_CARE_PROVIDER_SITE_OTHER): Payer: Self-pay | Admitting: Gastroenterology

## 2019-12-24 DIAGNOSIS — E11610 Type 2 diabetes mellitus with diabetic neuropathic arthropathy: Secondary | ICD-10-CM | POA: Diagnosis not present

## 2019-12-24 DIAGNOSIS — N182 Chronic kidney disease, stage 2 (mild): Secondary | ICD-10-CM | POA: Diagnosis not present

## 2019-12-24 DIAGNOSIS — I10 Essential (primary) hypertension: Secondary | ICD-10-CM | POA: Diagnosis not present

## 2019-12-26 DIAGNOSIS — N1832 Chronic kidney disease, stage 3b: Secondary | ICD-10-CM | POA: Diagnosis not present

## 2019-12-26 DIAGNOSIS — E559 Vitamin D deficiency, unspecified: Secondary | ICD-10-CM | POA: Diagnosis not present

## 2019-12-26 DIAGNOSIS — I5032 Chronic diastolic (congestive) heart failure: Secondary | ICD-10-CM | POA: Diagnosis not present

## 2019-12-26 DIAGNOSIS — D638 Anemia in other chronic diseases classified elsewhere: Secondary | ICD-10-CM | POA: Diagnosis not present

## 2019-12-26 DIAGNOSIS — N170 Acute kidney failure with tubular necrosis: Secondary | ICD-10-CM | POA: Diagnosis not present

## 2019-12-26 DIAGNOSIS — I129 Hypertensive chronic kidney disease with stage 1 through stage 4 chronic kidney disease, or unspecified chronic kidney disease: Secondary | ICD-10-CM | POA: Diagnosis not present

## 2019-12-26 DIAGNOSIS — F4320 Adjustment disorder, unspecified: Secondary | ICD-10-CM | POA: Diagnosis not present

## 2020-01-01 ENCOUNTER — Telehealth: Payer: Self-pay | Admitting: *Deleted

## 2020-01-01 NOTE — Telephone Encounter (Signed)
Primary Cardiologist:Samuel Domenic Polite, MD  Chart reviewed as part of pre-operative protocol coverage. Because of Diana Patterson's past medical history and time since last visit, he/she will require a follow-up visit in order to better assess preoperative cardiovascular risk.  Pre-op covering staff: - Please schedule appointment and call patient to inform them. - Please contact requesting surgeon's office via preferred method (i.e, phone, fax) to inform them of need for appointment prior to surgery.  If applicable, this message will also be routed to pharmacy pool and/or primary cardiologist for input on holding anticoagulant/antiplatelet agent as requested below so that this information is available at time of patient's appointment.   Deberah Pelton, NP  01/01/2020, 12:03 PM

## 2020-01-01 NOTE — Telephone Encounter (Signed)
Will send note to [facility] to assist in scheduling pre op appt.

## 2020-01-01 NOTE — Telephone Encounter (Signed)
Pt has appt 01/18/20 with Levell July, NP for pre op clearance. Will remove from the pre op call back pool.

## 2020-01-01 NOTE — Telephone Encounter (Signed)
   [facility] Pre-operative Risk Assessment    HEARTCARE STAFF: - Please ensure there is not already an duplicate clearance open for this procedure. - Under Visit Info/Reason for Call, type in Other and utilize the format Clearance MM/DD/YY or Clearance TBD. Do not use dashes or single digits. - If request is for dental extraction, please clarify the # of teeth to be extracted.  Request for surgical clearance:  1. What type of surgery is being performed? RIGHT TOTAL HP ARTHROPLASTY  ANTERIOR   2. When is this surgery scheduled? 03/03/2020 PRE OP DATE 02/12/2020  3. What type of clearance is required (medical clearance vs. Pharmacy clearance to hold med vs. Both)? MEDICAL   4. Are there any medications that need to be held prior to surgery and how long? N/A   5. Practice name and name of physician performing surgery?   [facility]  6. What is the office phone number? [phone]  ATTN: KELLY HANCOCK   7.   What is the office fax number? FAX # [phone]  8.   Anesthesia type (None, local, MAC, general) ? CHOICE    Julian Hy T 01/01/2020, 11:12 AM  _________________________________________________________________   (provider comments below)

## 2020-01-10 ENCOUNTER — Other Ambulatory Visit: Payer: Self-pay

## 2020-01-10 ENCOUNTER — Ambulatory Visit (INDEPENDENT_AMBULATORY_CARE_PROVIDER_SITE_OTHER): Payer: Medicare Other | Admitting: Gastroenterology

## 2020-01-10 ENCOUNTER — Encounter (INDEPENDENT_AMBULATORY_CARE_PROVIDER_SITE_OTHER): Payer: Self-pay | Admitting: Gastroenterology

## 2020-01-10 DIAGNOSIS — D509 Iron deficiency anemia, unspecified: Secondary | ICD-10-CM | POA: Diagnosis not present

## 2020-01-10 DIAGNOSIS — K921 Melena: Secondary | ICD-10-CM | POA: Diagnosis not present

## 2020-01-10 NOTE — Patient Instructions (Signed)
Please bring records of your most recent EGD and colonoscopy Continue oral iron supplementation

## 2020-01-10 NOTE — Progress Notes (Signed)
Maylon Peppers, M.D. Gastroenterology & Hepatology [facility] [address] [place], [place] [address] Primary Care Physician: Neale Burly, MD [place] [address]  Referring MD: PCP  I will communicate my assessment and recommendations to the referring MD via EMR. Note: Occasional unusual wording and randomly placed punctuation marks may result from the use of speech recognition technology to transcribe this document"  Chief Complaint: Anemia  History of Present Illness: Diana Patterson is a 74 y.o. female with PMH CAD s/p stent placement, NSTEMI, DM, HLD, TIA, GERD, who presents for evaluation of anemia and episodes of regurgitation.  States that for the last 2-3 months she has noticed recurrent episodes of melena but no rectal bleeding or hematochezia. She states she has been taking iron supplementation for long term due to a previous history of iron deficiency anemia.  However, she never saw her stools turn a dark color as they have turned recently. The patient denies having any  vomiting, fever, chills, hematochezia,  hematemesis, abdominal distention, abdominal pain, diarrhea, jaundice, pruritus.  The patient states that when she does not take the [place] she has some episodes of regurgitation but as long as she is taking the medication it controls her symptoms. Also, the patient reports multiple years of nausea, for which she takes Zofran chronically which has controlled her nausea.  Patient states she has lost close to 40 lb since the last time she was seen in the office 3 years ago.  Her appetite has decreased and she believes that's affected her weight.  She believes that her weight has been stable for the last few weeks.  Most recent available records from 12/05/2019 showed a hemoglobin of 10.0, MCV 96.1, normal platelet count of 186, with well cell count of 3.7. CMP showed albumin of 3.4 and mild  elevation of creatinine 1.24, rest of electrolytes were within normal limits and liver enzymes as well. Her most recent iron stores were from 12/29/2017 which showed an iron of 105, iron saturation of 30% and ferritin of 395.  Last EGD: 2021 - per patient she had an alteration, but no report is available Last Colonoscopy: 2021 - per patient it was normal, but no report is available  FHx: neg for any gastrointestinal/liver disease, daughter breast cancer Social: neg smoking, alcohol or illicit drug use Surgical: cholecystectomy, tubal pregnancy  Past Medical History: Past Medical History:  Diagnosis Date  . Anginal pain ([place])   . Arthritis    RA  . CAD (coronary artery disease)    DES to circumflex June 2017, moderate residual LAD disease  . CKD (chronic kidney disease) stage 3, GFR 30-59 ml/min ([facility])    Dr. Lowanda Foster  . Essential hypertension, benign   . Mixed hyperlipidemia   . NSTEMI (non-ST elevated myocardial infarction) ([place]) 2016   Probable type II event, negative Cardiolite 2012  . Sleep apnea   . TIA (transient ischemic attack)   . Type 2 diabetes mellitus ([place])     Past Surgical History: Past Surgical History:  Procedure Laterality Date  . CARDIAC CATHETERIZATION N/A 08/23/2015   Procedure: Left Heart Cath and Coronary Angiography;  Surgeon: Leonie Man, MD;  Location: [facility];  Service: Cardiovascular;  Laterality: N/A;  . CARDIAC CATHETERIZATION N/A 08/23/2015   Procedure: Coronary Stent Intervention;  Surgeon: Leonie Man, MD;  Location: [facility];  Service: Cardiovascular;  Laterality: N/A;  . CHOLECYSTECTOMY    .  EYE SURGERY  1990s  . KNEE ARTHROPLASTY Bilateral   . Tubal pregnancy removal      Family History: Family History  Problem Relation Age of Onset  . Heart attack Father   . Diabetes Sister     Social History: Social History   Tobacco Use  Smoking Status Former Smoker  . Packs/day: 1.00  . Years: 20.00  . Pack years:  20.00  . Types: Cigarettes  . Quit date: 03/22/1978  . Years since quitting: 41.8  Smokeless Tobacco Never Used   Social History   Substance and Sexual Activity  Alcohol Use No  . Alcohol/week: 0.0 standard drinks   Social History   Substance and Sexual Activity  Drug Use No    Allergies: Allergies  Allergen Reactions  . Morphine And Related Other (See Comments)    Drowsiness    Medications: Current Outpatient Medications  Medication Sig Dispense Refill  . aspirin 81 MG tablet Take 81 mg by mouth daily.      [name] atorvastatin (LIPITOR) 80 MG tablet Take 1 tablet (80 mg total) by mouth daily at 6 PM. 30 tablet 5  . Calcium Carb-Cholecalciferol (CALCIUM 1000 + D) 1000-800 MG-UNIT TABS Take 1 tablet by mouth daily.     . Choline Fenofibrate (TRILIPIX) 135 MG capsule Take 135 mg by mouth daily.      [name] dexlansoprazole (DEXILANT) 60 MG capsule Take 1 capsule (60 mg total) by mouth daily. 90 capsule 3  . diclofenac Sodium (VOLTAREN) 1 % GEL Apply 2 g topically in the morning and at bedtime.    . folic acid (FOLVITE) 1 MG tablet Take 1 mg by mouth See admin instructions. Take 1 mg by mouth daily except for Saturday    . furosemide (LASIX) 20 MG tablet Take 20 mg by mouth daily.     . isosorbide dinitrate (ISORDIL) 30 MG tablet Take 1 tablet (30 mg total) by mouth daily. 30 tablet 5  . levocetirizine (XYZAL) 5 MG tablet Take 5 mg by mouth every evening.    [name] losartan (COZAAR) 25 MG tablet Take 25 mg by mouth daily.    . metaxalone (SKELAXIN) 800 MG tablet Take 800 mg by mouth 2 (two) times daily.     . metFORMIN (GLUCOPHAGE) 500 MG tablet Take 500 mg by mouth 2 (two) times daily with a meal.    . methotrexate (RHEUMATREX) 2.5 MG tablet Take 20 mg by mouth every Saturday.     . metoprolol tartrate (LOPRESSOR) 50 MG tablet Take 1 tablet (50 mg total) by mouth 2 (two) times daily. 60 tablet 5  . nitroGLYCERIN (NITROSTAT) 0.4 MG SL tablet Place 1 tablet (0.4 mg total) under the tongue every  5 (five) minutes as needed for chest pain. 25 tablet 3  . Omega-3 Fatty Acids (FISH OIL) 1000 MG CAPS Take 1,000 mg by mouth 2 (two) times daily.     [name] oxybutynin (DITROPAN-XL) 10 MG 24 hr tablet Take 10 mg by mouth daily.    . Polysaccharide Iron Complex (POLY-IRON 150 PO) Take 150 mg by mouth daily.     . potassium chloride SA (K-DUR,KLOR-CON) 20 MEQ tablet Take 20 mEq by mouth daily.    . traMADol (ULTRAM) 50 MG tablet Take 1-2 tablets by mouth 2 (two) times daily as needed.    . valACYclovir (VALTREX) 500 MG tablet Take 500 mg by mouth daily.     No current facility-administered medications for this visit.    Review of Systems: GENERAL:  negative for malaise, night sweats HEENT: No changes in hearing or vision, no nose bleeds or other nasal problems. NECK: Negative for lumps, goiter, pain and significant neck swelling RESPIRATORY: Negative for cough, wheezing CARDIOVASCULAR: Negative for chest pain, leg swelling, palpitations, orthopnea GI: SEE HPI MUSCULOSKELETAL: Negative for joint pain or swelling, back pain, and muscle pain. SKIN: Negative for lesions, rash PSYCH: Negative for sleep disturbance, mood disorder and recent psychosocial stressors. HEMATOLOGY Negative for prolonged bleeding, bruising easily, and swollen nodes. ENDOCRINE: Negative for cold or heat intolerance, polyuria, polydipsia and goiter. NEURO: negative for tremor, gait imbalance, syncope and seizures. The remainder of the review of systems is noncontributory.   Physical Exam: BP (!) 158/64 (BP Location: Right Arm, Patient Position: Sitting, Cuff Size: Large)   Pulse 81   Temp 98.1 F (36.7 C) (Oral)   Ht 5\' 6"  (1.676 m)   Wt 169 lb (76.7 kg)   BMI 27.28 kg/m  GENERAL: The patient is AO x3, in no acute distress. HEENT: Head is normocephalic and atraumatic. EOMI are intact. Mouth is well hydrated and without lesions. NECK: Supple. No masses LUNGS: Clear to auscultation. No presence of  rhonchi/wheezing/rales. Adequate chest expansion HEART: RRR, normal s1 and s2. ABDOMEN: Soft, nontender, no guarding, no peritoneal signs, and nondistended. BS +. No masses. EXTREMITIES: Without any cyanosis, clubbing, rash, lesions or edema. NEUROLOGIC: AOx3, no focal motor deficit. SKIN: no jaundice, no rashes   Imaging/Labs: as above  I personally reviewed and interpreted the available labs, imaging and endoscopic files.  Impression and Plan: Radie Berges is a 74 y.o. female with PMH CAD s/p stent placement, NSTEMI, DM, HLD, TIA, GERD, who presents for evaluation of anemia and episodes of regurgitation.  Regarding her anemia, the patient has presented normocytic anemia in her most recent blood work-up, she has been on chronic iron supplementation orally.  I explained to her that it would be important for me to have the reports of her most recent EGD and colonoscopy to evaluate the best way to approach her case as if this investigations were negative we may need to proceed with capsule endoscopy.  She will bring the records of the EGD and colonoscopy that were performed this year.  For now, she needs to continue her iron supplementation and I will give her a call once I get the reports and decide next steps in her evaluation.  - Please bring records of your most recent EGD and colonoscopy - Continue oral iron supplementation  All questions were answered.      Maylon Peppers, MD Gastroenterology and Hepatology [facility]

## 2020-01-17 NOTE — Progress Notes (Signed)
Cardiology Office Note  Date: 01/18/2020   ID: Ahlayah, Tarkowski 05-Jun-1945, MRN [id number]  PCP:  Neale Burly, MD  Cardiologist:  Rozann Lesches, MD Electrophysiologist:  None   Chief Complaint: Preop clearance right total hip arthroplasty anterior scheduled for 03/03/2020 by Dr. Gaynelle Arabian  History of Present Illness: Diana Patterson is a 74 y.o. female with a history of CAD (DES to LCx 2017), HTN, HLD, NSTEMI, sleep apnea, TIA, type 2 DM, stage IIIb CKD, near syncope, history of GIB.  Previous hospitalization 11/09/2018 for lightheadedness and near syncope.  Had low BP and treated with IV fluids.  Carotid studies minimal plaque, chest x-ray negative, EKG NSR, echo EF greater than 55%.  Troponins negative, BNP normal.  Follow-up 01/16/2019 with Dr. Domenic Polite.  No subsequent near syncopal episodes, palpitations, or exertional chest pain.  Having continuing pain in right hip.  Was seeing Ortho at that time.  She was anticipating surgery.  Follows  with Dr. Theador Hawthorne, nephrology for renal disease.  Renal labs 07/10/2019: Creatinine 1.13, GFR 55.  Renal labs from [facility] 12/05/2019 showed creatinine of 1.24 and GFR of 49  Previous GI bleeding/anemia.  CBC 6 months ago on 07/10/2019: Hemoglobin 9.9, hematocrit 29.6.  Recent labs from [facility] on 12/05/2019 showed hemoglobin of 10.0 and hematocrit of 29.6. Had been experiencing melena.  Recent history of helicobactor pylori gastroenteritis .  She had colonoscopy and EGD by  Dr. Ladona Horns, [facility] in [place]   She is here today for preop clearance for right total hip arthroplasty anterior approach on 03/03/2020 with Dr. Wynelle Link.  She denies any recent anginal or exertional symptoms, palpitations or arrhythmias, orthostatic symptoms, CVA or TIA-like symptoms, PND, orthopnea.  Recently had both an EGD and colonoscopy by Dr. Ladona Horns at [facility].  She states she had those records sent to Dr.  Wynelle Link for review.  She was having issues in April with GI bleeding and melena/dark tarry stools.  Continues with some occasional dark tarry stools.  Most recent H&H in epic shows hemoglobin 9.9 and hematocrit 29.6.  She does have CKD stage III followed by nephrology.  Her mobility is obviously limited by her hip.  She is using a Rollator to ambulate today.  Denies any claudication-like symptoms but does complain of hip pain.  No DVT or PE-like symptoms, or lower extremity edema.  Past Medical History:  Diagnosis Date  . Anginal pain ([place])   . Arthritis    RA  . CAD (coronary artery disease)    DES to circumflex June 2017, moderate residual LAD disease  . CKD (chronic kidney disease) stage 3, GFR 30-59 ml/min (HCC)    Dr. Lowanda [place]  . Essential hypertension, benign   . Mixed hyperlipidemia   . NSTEMI (non-ST elevated myocardial infarction) ([place]) 2016   Probable type II event, negative Cardiolite 2012  . Sleep apnea   . TIA (transient ischemic attack)   . Type 2 diabetes mellitus ([place])     Past Surgical History:  Procedure Laterality Date  . CARDIAC CATHETERIZATION N/A 08/23/2015   Procedure: Left Heart Cath and Coronary Angiography;  Surgeon: Leonie Man, MD;  Location: [place] [facility];  Service: Cardiovascular;  Laterality: N/A;  . CARDIAC CATHETERIZATION N/A 08/23/2015   Procedure: Coronary Stent Intervention;  Surgeon: Leonie Man, MD;  Location: [place] [facility];  Service: Cardiovascular;  Laterality: N/A;  . CHOLECYSTECTOMY    . EYE SURGERY  1990s  . KNEE  ARTHROPLASTY Bilateral   . Tubal pregnancy removal      Current Outpatient Medications  Medication Sig Dispense Refill  . acetaminophen (TYLENOL) 500 MG tablet Take 500 mg by mouth every 6 (six) hours as needed. Patient takes 3 by mouth in the morning and 3 by mouth at night.    . ALPRAZolam (XANAX) 0.5 MG tablet Take 0.5 mg by mouth at bedtime.    [name] aspirin 81 MG tablet Take 81 mg by mouth daily.      [name]  atorvastatin (LIPITOR) 80 MG tablet Take 1 tablet (80 mg total) by mouth daily at 6 PM. 30 tablet 5  . Calcium Carb-Cholecalciferol (CALCIUM 1000 + D) 1000-800 MG-UNIT TABS Take 1 tablet by mouth daily.     . Choline Fenofibrate (TRILIPIX) 135 MG capsule Take 135 mg by mouth daily.      [name] dexlansoprazole (DEXILANT) 60 MG capsule Take 1 capsule (60 mg total) by mouth daily. 90 capsule 3  . diclofenac Sodium (VOLTAREN) 1 % GEL Apply 2 g topically in the morning and at bedtime.    . folic acid (FOLVITE) 1 MG tablet Take 1 mg by mouth See admin instructions. Take 1 mg by mouth daily except for Saturday    . furosemide (LASIX) 20 MG tablet Take 20 mg by mouth daily.     . isosorbide dinitrate (ISORDIL) 30 MG tablet Take 1 tablet (30 mg total) by mouth daily. 30 tablet 5  . levocetirizine (XYZAL) 5 MG tablet Take 5 mg by mouth every evening.    [name] losartan (COZAAR) 25 MG tablet Take 25 mg by mouth daily.    . metaxalone (SKELAXIN) 800 MG tablet Take 800 mg by mouth 2 (two) times daily.     . metFORMIN (GLUCOPHAGE) 500 MG tablet Take 500 mg by mouth 2 (two) times daily with a meal.    . methotrexate (RHEUMATREX) 2.5 MG tablet Take 20 mg by mouth every Saturday.     . metoprolol tartrate (LOPRESSOR) 50 MG tablet Take 1 tablet (50 mg total) by mouth 2 (two) times daily. 60 tablet 5  . nitroGLYCERIN (NITROSTAT) 0.4 MG SL tablet Place 1 tablet (0.4 mg total) under the tongue every 5 (five) minutes as needed for chest pain. 25 tablet 3  . Omega-3 Fatty Acids (FISH OIL) 1000 MG CAPS Take 1,000 mg by mouth 2 (two) times daily.     [name] oxybutynin (DITROPAN-XL) 10 MG 24 hr tablet Take 10 mg by mouth daily.     . Polysaccharide Iron Complex (POLY-IRON 150 PO) Take 150 mg by mouth daily.     . potassium chloride SA (K-DUR,KLOR-CON) 20 MEQ tablet Take 20 mEq by mouth daily.    . traMADol (ULTRAM) 50 MG tablet Take 1-2 tablets by mouth 2 (two) times daily as needed.    . valACYclovir (VALTREX) 500 MG tablet Take 500 mg  by mouth daily.     No current facility-administered medications for this visit.   Allergies:  Morphine and related   Social History: The patient  reports that she quit smoking about 41 years ago. Her smoking use included cigarettes. She has a 20.00 pack-year smoking history. She has never used smokeless tobacco. She reports that she does not drink alcohol and does not use drugs.   Family History: The patient's family history includes Diabetes in her sister; Heart attack in her father.   ROS:  Please see the history of present illness. Otherwise, complete review of systems is positive for  none.  All other systems are reviewed and negative.   Physical Exam: VS:  BP (!) 110/58   Pulse 66   Ht 5\' 5"  (1.651 m)   Wt 167 lb (75.8 kg)   SpO2 98%   BMI 27.79 kg/m , BMI Body mass index is 27.79 kg/m.  Wt Readings from Last 3 Encounters:  01/18/20 167 lb (75.8 kg)  01/10/20 169 lb (76.7 kg)  07/19/19 183 lb 3.2 oz (83.1 kg)    General: Patient appears comfortable at rest. Neck: Supple, no elevated JVP or carotid bruits, no thyromegaly. Lungs: Clear to auscultation, nonlabored breathing at rest. Cardiac: Regular rate and rhythm, no S3 or significant systolic murmur, no pericardial rub. Extremities: No pitting edema, distal pulses 2+. Skin: Warm and dry. Musculoskeletal: No kyphosis. Neuropsychiatric: Alert and oriented x3, affect grossly appropriate.  ECG:  An ECG dated 01/18/2020 was personally reviewed today and demonstrated:  Normal sinus rhythm rate of 66.  Recent Labwork: 07/10/2019: ALT 26; AST 43; BUN 18; Creatinine, Ser 1.13; Hemoglobin 9.9; Platelets 268; Potassium 4.5; Sodium 141  No results found for: CHOL, TRIG, HDL, CHOLHDL, VLDL, LDLCALC, LDLDIRECT  Other Studies Reviewed Today:  Echocardiogram 11/14/2018 at [facility] Narrative   Normal left ventricular systolic function, ejection fraction > 55%   Normal right ventricular systolic function   Mitral annular  calcification   Aortic sclerosis    Cardiac catheterization 08/23/2015 Coronary Stent Intervention  Left Heart Cath and Coronary Angiography  Conclusion   Prox Cx to Mid Cx lesion, 90% stenosed. Post intervention aspiration thrombectomy followed by Promus Premier DES 3.5 mm x 12 mm (3.75 mm) , there is a 0% residual stenosis.  Prox LAD-2 lesion, 50-75% stenosed.  The left ventricular systolic function is normal.  Elevated LVEDP   Diagnostic Dominance: Co-dominant  Intervention      Assessment and Plan:  1. Preoperative clearance   2. Coronary artery disease involving native coronary artery of native heart without angina pectoris   3. Essential hypertension   4. Mixed hyperlipidemia    1. Preoperative clearance Patient is pending right total hip arthroplasty anterior approach on 03/03/2020 by Dr. Wynelle Link revised cardiac risk index score = 1 pacing the patient had a perioperative risk of major cardiac event at 0.9%.  Patient is cleared to undergo hip replacement under general anesthesia from a cardiac standpoint.  2. Coronary artery disease involving native coronary artery of native heart without angina pectoris Denies any anginal or exertional symptoms.  Not very active due to issues with hip pain.  Continue aspirin 81 mg daily, Imdur 30 mg daily, nitroglycerin sublingual as needed.  3. Essential hypertension Blood pressure well controlled on current therapy.  Blood pressure today 110/58. Continue losartan 25 mg daily.  Continue metoprolol 50 mg p.o. twice daily.  Continue Lasix 20 mg daily.   4. Mixed hyperlipidemia Continue atorvastatin 80 mg p.o. daily.   5.  Anemia Recent hemoglobin and hematocrit on 07/10/2019 9.9 and 29.6.  She has had some issues with some GI bleeding and black tarry stools.  She had a recent EGD and colonoscopy done by Dr. Ladona Horns at [facility].  Patient states those results have been sent to the orthopedic surgeon Dr. Wynelle Link.  We do not  have those results available  Medication Adjustments/Labs and Tests Ordered: Current medicines are reviewed at length with the patient today.  Concerns regarding medicines are outlined above.   Disposition: Follow-up with Dr. Domenic Polite or APP 6 months  Signed, Levell July, NP  01/18/2020 8:37 AM    [facility] [place], [place], [place] [address] Phone: [phone]; Fax: [phone]

## 2020-01-18 ENCOUNTER — Ambulatory Visit (INDEPENDENT_AMBULATORY_CARE_PROVIDER_SITE_OTHER): Payer: Medicare Other | Admitting: Family Medicine

## 2020-01-18 ENCOUNTER — Other Ambulatory Visit: Payer: Self-pay

## 2020-01-18 ENCOUNTER — Encounter: Payer: Self-pay | Admitting: Family Medicine

## 2020-01-18 VITALS — BP 110/58 | HR 66 | Ht 65.0 in | Wt 167.0 lb

## 2020-01-18 DIAGNOSIS — I10 Essential (primary) hypertension: Secondary | ICD-10-CM | POA: Diagnosis not present

## 2020-01-18 DIAGNOSIS — I2510 Atherosclerotic heart disease of native coronary artery without angina pectoris: Secondary | ICD-10-CM

## 2020-01-18 DIAGNOSIS — E782 Mixed hyperlipidemia: Secondary | ICD-10-CM

## 2020-01-18 DIAGNOSIS — Z01818 Encounter for other preprocedural examination: Secondary | ICD-10-CM | POA: Diagnosis not present

## 2020-01-18 NOTE — Patient Instructions (Signed)
Medication Instructions:  Continue all current medications.   Labwork: none  Testing/Procedures: none  Follow-Up: 6 months   Any Other Special Instructions Will Be Listed Below (If Applicable).   If you need a refill on your cardiac medications before your next appointment, please call your pharmacy.  

## 2020-01-21 ENCOUNTER — Telehealth (INDEPENDENT_AMBULATORY_CARE_PROVIDER_SITE_OTHER): Payer: Self-pay | Admitting: Gastroenterology

## 2020-01-21 ENCOUNTER — Encounter (INDEPENDENT_AMBULATORY_CARE_PROVIDER_SITE_OTHER): Payer: Self-pay | Admitting: *Deleted

## 2020-01-21 NOTE — Telephone Encounter (Signed)
Spoke with the patient today regarding her most recent EGD and colonoscopy which were performed 08/20/2019 at [facility].  This test were performed by Dr. Karlyn Agee.  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  Colonoscopy showed grade 2 hemorrhoids without any other alteration.  Due to this, we will proceed with a capsule endoscopy which will be ordered today.  Patient was cleaning all the benefits, risk and potential complications from the procedure she understood and agreed.  Ann, can you please set up her capsule endoscopy please?  Maylon Peppers, MD Gastroenterology and Hepatology [facility]

## 2020-01-21 NOTE — Telephone Encounter (Signed)
GIVENS sch'd 02/06/20, patient aware, she will pick up instructions

## 2020-01-22 DIAGNOSIS — Z1331 Encounter for screening for depression: Secondary | ICD-10-CM | POA: Diagnosis not present

## 2020-01-22 DIAGNOSIS — N182 Chronic kidney disease, stage 2 (mild): Secondary | ICD-10-CM | POA: Diagnosis not present

## 2020-01-22 DIAGNOSIS — E1169 Type 2 diabetes mellitus with other specified complication: Secondary | ICD-10-CM | POA: Diagnosis not present

## 2020-01-22 DIAGNOSIS — E7849 Other hyperlipidemia: Secondary | ICD-10-CM | POA: Diagnosis not present

## 2020-01-22 DIAGNOSIS — I10 Essential (primary) hypertension: Secondary | ICD-10-CM | POA: Diagnosis not present

## 2020-01-22 DIAGNOSIS — M25551 Pain in right hip: Secondary | ICD-10-CM | POA: Diagnosis not present

## 2020-01-22 DIAGNOSIS — G4730 Sleep apnea, unspecified: Secondary | ICD-10-CM | POA: Diagnosis not present

## 2020-01-22 DIAGNOSIS — Z0000 Encounter for general adult medical examination without abnormal findings: Secondary | ICD-10-CM | POA: Diagnosis not present

## 2020-01-24 DIAGNOSIS — E11610 Type 2 diabetes mellitus with diabetic neuropathic arthropathy: Secondary | ICD-10-CM | POA: Diagnosis not present

## 2020-01-24 DIAGNOSIS — I10 Essential (primary) hypertension: Secondary | ICD-10-CM | POA: Diagnosis not present

## 2020-01-24 DIAGNOSIS — N182 Chronic kidney disease, stage 2 (mild): Secondary | ICD-10-CM | POA: Diagnosis not present

## 2020-01-30 DIAGNOSIS — Z1159 Encounter for screening for other viral diseases: Secondary | ICD-10-CM | POA: Diagnosis not present

## 2020-02-04 ENCOUNTER — Telehealth (INDEPENDENT_AMBULATORY_CARE_PROVIDER_SITE_OTHER): Payer: Self-pay

## 2020-02-04 NOTE — Telephone Encounter (Signed)
Diana Patterson called today and stated she cant do the GIVENS Capsule Study Wednesday 02/06/20 she will have to wait until after the first of the year to do this, she will call back to let us know

## 2020-02-05 DIAGNOSIS — E1140 Type 2 diabetes mellitus with diabetic neuropathy, unspecified: Secondary | ICD-10-CM | POA: Diagnosis not present

## 2020-02-05 DIAGNOSIS — M79674 Pain in right toe(s): Secondary | ICD-10-CM | POA: Diagnosis not present

## 2020-02-05 DIAGNOSIS — M79675 Pain in left toe(s): Secondary | ICD-10-CM | POA: Diagnosis not present

## 2020-02-05 DIAGNOSIS — M79671 Pain in right foot: Secondary | ICD-10-CM | POA: Diagnosis not present

## 2020-02-05 DIAGNOSIS — M79672 Pain in left foot: Secondary | ICD-10-CM | POA: Diagnosis not present

## 2020-02-05 DIAGNOSIS — I739 Peripheral vascular disease, unspecified: Secondary | ICD-10-CM | POA: Diagnosis not present

## 2020-02-05 DIAGNOSIS — B353 Tinea pedis: Secondary | ICD-10-CM | POA: Diagnosis not present

## 2020-02-05 NOTE — Telephone Encounter (Signed)
Thanks Leigh Ann °

## 2020-02-06 ENCOUNTER — Encounter (HOSPITAL_COMMUNITY): Admission: RE | Payer: Self-pay | Source: Home / Self Care

## 2020-02-06 ENCOUNTER — Ambulatory Visit (HOSPITAL_COMMUNITY): Admission: RE | Admit: 2020-02-06 | Payer: Medicare Other | Source: Home / Self Care | Admitting: Gastroenterology

## 2020-02-06 SURGERY — IMAGING PROCEDURE, GI TRACT, INTRALUMINAL, VIA CAPSULE

## 2020-02-12 DIAGNOSIS — M1611 Unilateral primary osteoarthritis, right hip: Secondary | ICD-10-CM | POA: Diagnosis not present

## 2020-02-20 NOTE — Patient Instructions (Addendum)
DUE TO COVID-19 ONLY ONE VISITOR IS ALLOWED TO COME WITH YOU AND STAY IN THE WAITING ROOM ONLY DURING PRE OP AND PROCEDURE DAY OF SURGERY. THE 1 VISITOR  MAY VISIT WITH YOU AFTER SURGERY IN YOUR PRIVATE ROOM DURING VISITING HOURS ONLY!  YOU NEED TO HAVE A COVID 19 TEST ON_12/11______ @__11 :05_____, THIS TEST MUST BE DONE BEFORE SURGERY,  COVID TESTING SITE [address] [place] [address], IT IS ON THE RIGHT GOING OUT [address] APPROXIMATELY  2 MINUTES PAST [employer] ON THE RIGHT. ONCE YOUR COVID TEST IS COMPLETED,  PLEASE BEGIN THE QUARANTINE INSTRUCTIONS AS OUTLINED IN YOUR HANDOUT.                Diana Patterson    Your procedure is scheduled on: 03/05/20   Report to [facility] Main  Entrance   Report to admitting at   9:40 AM     Call this number if you have problems the morning of surgery [phone]    . BRUSH YOUR TEETH MORNING OF SURGERY AND RINSE YOUR MOUTH OUT, NO CHEWING GUM CANDY OR MINTS.   No food after midnight.    You may have clear liquid until 8:00 AM.    At 7:30 AM drink pre surgery drink.   Nothing by mouth after 8:00 AM.   Take these medicines the morning of surgery with A SIP OF WATER: Metoprolol, Dexilant                       Bring your mask and tubing to the hospital    How to Manage Your Diabetes Before and After Surgery  Why is it important to control my blood sugar before and after surgery? . Improving blood sugar levels before and after surgery helps healing and can limit problems. . A way of improving blood sugar control is eating a healthy diet by: o  Eating less sugar and carbohydrates o  Increasing activity/exercise o  Talking with your doctor about reaching your blood sugar goals . High blood sugars (greater than 180 mg/dL) can raise your risk of infections and slow your recovery, so you will need to focus on controlling your diabetes during the weeks before surgery. . Make sure that the doctor who  takes care of your diabetes knows about your planned surgery including the date and location.  How do I manage my blood sugar before surgery? . Check your blood sugar at least 4 times a day, starting 2 days before surgery, to make sure that the level is not too high or low. o Check your blood sugar the morning of your surgery when you wake up and every 2 hours until you get to the Short Stay unit. . If your blood sugar is less than 70 mg/dL, you will need to treat for low blood sugar: o Do not take insulin. o Treat a low blood sugar (less than 70 mg/dL) with  cup of clear juice (cranberry or apple), 4 glucose tablets, OR glucose gel. o Recheck blood sugar in 15 minutes after treatment (to make sure it is greater than 70 mg/dL). If your blood sugar is not greater than 70 mg/dL on recheck, call [phone] for further instructions. . Report your blood sugar to the short stay nurse when you get to Short Stay.  . If you are admitted to the hospital after surgery: o Your blood sugar will be checked by the staff and you will probably be  given insulin after surgery (instead of oral diabetes medicines) to make sure you have good blood sugar levels. o The goal for blood sugar control after surgery is 80-180 mg/dL.   WHAT DO I DO ABOUT MY DIABETES MEDICATION?  [name] Do not take oral diabetes medicines (pills) the morning of surgery..                               You may not have any metal on your body including hair pins and              piercings  Do not wear jewelry, make-up, lotions, powders or perfumes, deodorant             Do not wear nail polish on your fingernails.  Do not shave  48 hours prior to surgery.                 Do not bring valuables to the hospital. [place].  Contacts, dentures or bridgework may not be worn into surgery.        Name and phone number of your driver:  Special Instructions: N/A              Please read over  the following fact sheets you were given: _____________________________________________________________________             [facility] - Preparing for Surgery Before surgery, you can play an important role.   Because skin is not sterile, your skin needs to be as free of germs as possible.   You can reduce the number of germs on your skin by washing with CHG (chlorahexidine gluconate) soap before surgery .  CHG is an antiseptic cleaner which kills germs and bonds with the skin to continue killing germs even after washing. Please DO NOT use if you have an allergy to CHG or antibacterial soaps.   If your skin becomes reddened/irritated stop using the CHG and inform your nurse when you arrive at Short Stay. Do not shave (including legs and underarms) for at least 48 hours prior to the first CHG shower.  Please follow these instructions carefully:  1.  Shower with CHG Soap the night before surgery and the  morning of Surgery.  2.  If you choose to wash your hair, wash your hair first as usual with your  normal  shampoo.  3.  After you shampoo, rinse your hair and body thoroughly to remove the  shampoo.                                        4.  Use CHG as you would any other liquid soap.  You can apply chg directly  to the skin and wash                       Gently with a scrungie or clean washcloth.  5.  Apply the CHG Soap to your body ONLY FROM THE NECK DOWN.   Do not use on face/ open                           Wound or open sores. Avoid contact with eyes, ears  mouth and genitals (private parts).                       Wash face,  Genitals (private parts) with your normal soap.             6.  Wash thoroughly, paying special attention to the area where your surgery  will be performed.  7.  Thoroughly rinse your body with warm water from the neck down.  8.  DO NOT shower/wash with your normal soap after using and rinsing off  the CHG Soap.             9.  Pat yourself dry with a clean towel.             10.  Wear clean pajamas.            11.  Place clean sheets on your bed the night of your first shower and do not  sleep with pets. Day of Surgery : Do not apply any lotions/deodorants the morning of surgery.  Please wear clean clothes to the hospital/surgery center.  FAILURE TO FOLLOW THESE INSTRUCTIONS MAY RESULT IN THE CANCELLATION OF YOUR SURGERY PATIENT SIGNATURE_________________________________  NURSE SIGNATURE__________________________________  ________________________________________________________________________   Adam Phenix  An incentive spirometer is a tool that can help keep your lungs clear and active. This tool measures how well you are filling your lungs with each breath. Taking long deep breaths may help reverse or decrease the chance of developing breathing (pulmonary) problems (especially infection) following:  A long period of time when you are unable to move or be active. BEFORE THE PROCEDURE   If the spirometer includes an indicator to show your best effort, your nurse or respiratory therapist will set it to a desired goal.  If possible, sit up straight or lean slightly forward. Try not to slouch.  Hold the incentive spirometer in an upright position. INSTRUCTIONS FOR USE  1. Sit on the edge of your bed if possible, or sit up as far as you can in bed or on a chair. 2. Hold the incentive spirometer in an upright position. 3. Breathe out normally. 4. Place the mouthpiece in your mouth and seal your lips tightly around it. 5. Breathe in slowly and as deeply as possible, raising the piston or the ball toward the top of the column. 6. Hold your breath for 3-5 seconds or for as long as possible. Allow the piston or ball to fall to the bottom of the column. 7. Remove the mouthpiece from your mouth and breathe out normally. 8. Rest for a few seconds and repeat Steps 1 through 7 at least 10 times every 1-2 hours when you are awake. Take your time and  take a few normal breaths between deep breaths. 9. The spirometer may include an indicator to show your best effort. Use the indicator as a goal to work toward during each repetition. 10. After each set of 10 deep breaths, practice coughing to be sure your lungs are clear. If you have an incision (the cut made at the time of surgery), support your incision when coughing by placing a pillow or rolled up towels firmly against it. Once you are able to get out of bed, walk around indoors and cough well. You may stop using the incentive spirometer when instructed by your caregiver.  RISKS AND COMPLICATIONS  Take your time so you do not get dizzy or light-headed.  If you are in pain, you may  need to take or ask for pain medication before doing incentive spirometry. It is harder to take a deep breath if you are having pain. AFTER USE  Rest and breathe slowly and easily.  It can be helpful to keep track of a log of your progress. Your caregiver can provide you with a simple table to help with this. If you are using the spirometer at home, follow these instructions: [place] IF:   You are having difficultly using the spirometer.  You have trouble using the spirometer as often as instructed.  Your pain medication is not giving enough relief while using the spirometer.  You develop fever of 100.5 F (38.1 C) or higher. SEEK IMMEDIATE MEDICAL CARE IF:   You cough up bloody sputum that had not been present before.  You develop fever of 102 F (38.9 C) or greater.  You develop worsening pain at or near the incision site. MAKE SURE YOU:   Understand these instructions.  Will watch your condition.  Will get help right away if you are not doing well or get worse. Document Released: 07/19/2006 Document Revised: 05/31/2011 Document Reviewed: 09/19/2006 [facility] Patient Information 2014 [place], [place].   ________________________________________________________________________

## 2020-02-21 DIAGNOSIS — E11610 Type 2 diabetes mellitus with diabetic neuropathic arthropathy: Secondary | ICD-10-CM | POA: Diagnosis not present

## 2020-02-21 DIAGNOSIS — I10 Essential (primary) hypertension: Secondary | ICD-10-CM | POA: Diagnosis not present

## 2020-02-21 DIAGNOSIS — N182 Chronic kidney disease, stage 2 (mild): Secondary | ICD-10-CM | POA: Diagnosis not present

## 2020-02-22 ENCOUNTER — Other Ambulatory Visit: Payer: Self-pay

## 2020-02-22 ENCOUNTER — Encounter (HOSPITAL_COMMUNITY): Payer: Self-pay

## 2020-02-22 ENCOUNTER — Encounter (HOSPITAL_COMMUNITY)
Admission: RE | Admit: 2020-02-22 | Discharge: 2020-02-22 | Disposition: A | Discharge status: Home / Self Care | Payer: Medicare Other | Source: Ambulatory Visit | Attending: Orthopedic Surgery | Admitting: Orthopedic Surgery

## 2020-02-22 DIAGNOSIS — Z01812 Encounter for preprocedural laboratory examination: Secondary | ICD-10-CM | POA: Diagnosis not present

## 2020-02-22 HISTORY — DX: Cramp and spasm: R25.2

## 2020-02-22 HISTORY — DX: Cerebral infarction, unspecified: I63.9

## 2020-02-22 LAB — TYPE AND SCREEN
ABO/RH(D): A POS
Antibody Screen: NEGATIVE

## 2020-02-22 LAB — CBC
HCT: 28.1 % — ABNORMAL LOW (ref 36.0–46.0)
Hemoglobin: 9.6 g/dL — ABNORMAL LOW (ref 12.0–15.0)
MCH: 33 pg (ref 26.0–34.0)
MCHC: 34.2 g/dL (ref 30.0–36.0)
MCV: 96.6 fL (ref 80.0–100.0)
Platelets: 198 10*3/uL (ref 150–400)
RBC: 2.91 MIL/uL — ABNORMAL LOW (ref 3.87–5.11)
RDW: 18.5 % — ABNORMAL HIGH (ref 11.5–15.5)
WBC: 4.7 10*3/uL (ref 4.0–10.5)
nRBC: 0 % (ref 0.0–0.2)

## 2020-02-22 LAB — COMPREHENSIVE METABOLIC PANEL
ALT: 32 U/L (ref 0–44)
AST: 43 U/L — ABNORMAL HIGH (ref 15–41)
Albumin: 3.6 g/dL (ref 3.5–5.0)
Alkaline Phosphatase: 80 U/L (ref 38–126)
Anion gap: 9 (ref 5–15)
BUN: 19 mg/dL (ref 8–23)
CO2: 26 mmol/L (ref 22–32)
Calcium: 9.4 mg/dL (ref 8.9–10.3)
Chloride: 105 mmol/L (ref 98–111)
Creatinine, Ser: 1.02 mg/dL — ABNORMAL HIGH (ref 0.44–1.00)
GFR, Estimated: 57 mL/min — ABNORMAL LOW (ref >60)
Glucose, Bld: 104 mg/dL — ABNORMAL HIGH (ref 70–99)
Potassium: 4.6 mmol/L (ref 3.5–5.1)
Sodium: 140 mmol/L (ref 135–145)
Total Bilirubin: 0.7 mg/dL (ref 0.3–1.2)
Total Protein: 6.9 g/dL (ref 6.5–8.1)

## 2020-02-22 LAB — PROTIME-INR
INR: 1.2 (ref 0.8–1.2)
Prothrombin Time: 14.3 seconds (ref 11.4–15.2)

## 2020-02-22 LAB — GLUCOSE, CAPILLARY: Glucose-Capillary: 88 mg/dL (ref 70–99)

## 2020-02-22 LAB — HEMOGLOBIN A1C
Hgb A1c MFr Bld: 4.9 % (ref 4.8–5.6)
Mean Plasma Glucose: 93.93 mg/dL

## 2020-02-22 LAB — APTT: aPTT: 30 seconds (ref 24–36)

## 2020-02-22 LAB — SURGICAL PCR SCREEN
MRSA, PCR: NEGATIVE
Staphylococcus aureus: NEGATIVE

## 2020-02-22 NOTE — Progress Notes (Signed)
COVID Vaccine Completed:yes Date COVID Vaccine completed:2/29/21 COVID vaccine manufacturer:  Moderna    PCP - Dr. Nemiah Commander Cardiologist - Dr. Myles Gip  Chest x-ray - no EKG - 01/18/20-Epic Stress Test - no ECHO - 2017-Epic Cardiac Cath - with stent 08/23/19-Epic Pacemaker/ICD device last checked:NA  Sleep Study - yes CPAP - yes  Fasting Blood Sugar - 86-147 Checks Blood Sugar QD_____ times a day  Blood Thinner Instructions:[facility]/ Dr. Domenic Polite Aspirin Instructions:Stop 5 days prior to DOS/ [place] Last Dose:02/25/20  Anesthesia review:   Patient denies shortness of breath, fever, cough and chest pain at PAT appointment yes   Patient verbalized understanding of instructions that were given to them at the PAT appointment. Patient was also instructed that they will need to review over the PAT instructions again at home before surgery.Yes Pt takes stairs very slowly and uses a walker. She has no SOB walking or with ADLs

## 2020-02-25 ENCOUNTER — Encounter (HOSPITAL_COMMUNITY): Payer: Self-pay | Admitting: Physician Assistant

## 2020-02-25 NOTE — Progress Notes (Signed)
Anesthesia Chart Review   Case: [id number] Date/Time: 03/05/20 1155   Procedure: TOTAL HIP ARTHROPLASTY ANTERIOR APPROACH (Right Hip) - 19min   Anesthesia type: Choice   Pre-op diagnosis: right hip osteoarthritis   Location: [facility] ROOM 10 / [facility]   Surgeons: Gaynelle Arabian, MD      DISCUSSION:74 y.o. former smoker with h/o HTN, DM II, CKD Stage III, sleep apnea, CAD (DES 2017), right hip OA scheduled for above procedure 03/05/2020 with Dr. Gaynelle Arabian.   Pt last seen by cardiology 01/18/20. Per OV note, "Patient is pending right total hip arthroplasty anterior approach on 03/03/2020 by Dr. Wynelle Link revised cardiac risk index score = 1 pacing the patient had a perioperative risk of major cardiac event at 0.9%.  Patient is cleared to undergo hip replacement under general anesthesia from a cardiac standpoint."   VS: There were no vitals taken for this visit.  PROVIDERS: Neale Burly, MD is PCP   Rozann Lesches, MD is Cardiologist  LABS: labs forwarded to surgeon (all labs ordered are listed, but only abnormal results are displayed)  Labs Reviewed  CBC - Abnormal; Notable for the following components:      Result Value   RBC 2.91 (*)    Hemoglobin 9.6 (*)    HCT 28.1 (*)    RDW 18.5 (*)    All other components within normal limits  COMPREHENSIVE METABOLIC PANEL - Abnormal; Notable for the following components:   Glucose, Bld 104 (*)    Creatinine, Ser 1.02 (*)    AST 43 (*)    GFR, Estimated 57 (*)    All other components within normal limits  SURGICAL PCR SCREEN  HEMOGLOBIN A1C  APTT  PROTIME-INR  GLUCOSE, CAPILLARY  TYPE AND SCREEN     IMAGES:   EKG: 01/18/20 Rate 66 bpm  NSR  CV: Myocardial Perfusion 11/11/2015  No diagnostic ST segment changes to indicate ischemia.  Small, mild intensity, apical anterior defect that is predominantly fixed and most consistent with variable soft tissue attenuation. No large degree of anterior ischemia noted.  This is a  low risk study.  Nuclear stress EF: 54%.  Echo 08/25/2015 Study Conclusions   - Left ventricle: The cavity size was normal. Wall thickness was  increased in a pattern of mild LVH. Systolic function was normal.  The estimated ejection fraction was in the range of 60% to 65%.  Wall motion was normal; there were no regional wall motion  abnormalities. Doppler parameters are consistent with abnormal  left ventricular relaxation (grade 1 diastolic dysfunction).  - Mitral valve: Mildly calcified annulus.  Past Medical History:  Diagnosis Date  . Anginal pain ([place]) 2017  . Arthritis    RA  . CAD (coronary artery disease)    DES to circumflex June 2017, moderate residual LAD disease  . CKD (chronic kidney disease) stage 3, GFR 30-59 ml/min (HCC)    Dr. Lowanda Foster  . Essential hypertension, benign   . Mixed hyperlipidemia   . Muscle cramps at night   . NSTEMI (non-ST elevated myocardial infarction) ([place]) 2016   Probable type II event, negative Cardiolite 2012  . Sleep apnea   . Stroke ([place])    tia  . TIA (transient ischemic attack)   . Type 2 diabetes mellitus ([place])     Past Surgical History:  Procedure Laterality Date  . CARDIAC CATHETERIZATION N/A 08/23/2015   Procedure: Left Heart Cath and Coronary Angiography;  Surgeon: Leonie Man, MD;  Location: [facility];  Service: Cardiovascular;  Laterality: N/A;  . CARDIAC CATHETERIZATION N/A 08/23/2015   Procedure: Coronary Stent Intervention;  Surgeon: Leonie Man, MD;  Location: [facility];  Service: Cardiovascular;  Laterality: N/A;  . CHOLECYSTECTOMY    . EYE SURGERY  1990s  . KNEE ARTHROPLASTY Bilateral   . Tubal pregnancy removal      MEDICATIONS: . acetaminophen (TYLENOL) 650 MG CR tablet  . aspirin 81 MG tablet  . aspirin-sod bicarb-citric acid (ALKA-SELTZER) 325 MG TBEF tablet  . atorvastatin (LIPITOR) 80 MG tablet  . Calcium Carb-Cholecalciferol (CALCIUM 1000 + D) 1000-800 MG-UNIT TABS  .  Carboxymethylcellul-Glycerin (LUBRICATING EYE DROPS OP)  . Choline Fenofibrate (TRILIPIX) 135 MG capsule  . clotrimazole-betamethasone (LOTRISONE) cream  . Dexlansoprazole (DEXILANT) 30 MG capsule  . dexlansoprazole (DEXILANT) 60 MG capsule  . diclofenac Sodium (VOLTAREN) 1 % GEL  . econazole nitrate 1 % cream  . folic acid (FOLVITE) 1 MG tablet  . furosemide (LASIX) 20 MG tablet  . isosorbide dinitrate (ISORDIL) 30 MG tablet  . levocetirizine (XYZAL) 5 MG tablet  . losartan-hydrochlorothiazide (HYZAAR) 50-12.5 MG tablet  . metaxalone (SKELAXIN) 800 MG tablet  . metFORMIN (GLUCOPHAGE) 500 MG tablet  . methotrexate (RHEUMATREX) 2.5 MG tablet  . metoprolol tartrate (LOPRESSOR) 50 MG tablet  . montelukast (SINGULAIR) 10 MG tablet  . nitroGLYCERIN (NITROSTAT) 0.4 MG SL tablet  . Omega-3 300 MG CAPS  . Polysaccharide Iron Complex (POLY-IRON 150 PO)  . potassium chloride SA (K-DUR,KLOR-CON) 20 MEQ tablet  . valACYclovir (VALTREX) 500 MG tablet   No current facility-administered medications for this encounter.

## 2020-02-26 DIAGNOSIS — N1832 Chronic kidney disease, stage 3b: Secondary | ICD-10-CM | POA: Diagnosis not present

## 2020-02-26 DIAGNOSIS — D638 Anemia in other chronic diseases classified elsewhere: Secondary | ICD-10-CM | POA: Diagnosis not present

## 2020-02-26 DIAGNOSIS — N170 Acute kidney failure with tubular necrosis: Secondary | ICD-10-CM | POA: Diagnosis not present

## 2020-02-26 DIAGNOSIS — I5032 Chronic diastolic (congestive) heart failure: Secondary | ICD-10-CM | POA: Diagnosis not present

## 2020-02-26 DIAGNOSIS — E559 Vitamin D deficiency, unspecified: Secondary | ICD-10-CM | POA: Diagnosis not present

## 2020-02-26 DIAGNOSIS — B700 Diphyllobothriasis: Secondary | ICD-10-CM | POA: Diagnosis not present

## 2020-02-28 DIAGNOSIS — Z0000 Encounter for general adult medical examination without abnormal findings: Secondary | ICD-10-CM | POA: Diagnosis not present

## 2020-02-28 DIAGNOSIS — I10 Essential (primary) hypertension: Secondary | ICD-10-CM | POA: Diagnosis not present

## 2020-02-28 DIAGNOSIS — N182 Chronic kidney disease, stage 2 (mild): Secondary | ICD-10-CM | POA: Diagnosis not present

## 2020-02-28 DIAGNOSIS — Z1331 Encounter for screening for depression: Secondary | ICD-10-CM | POA: Diagnosis not present

## 2020-02-28 DIAGNOSIS — G4730 Sleep apnea, unspecified: Secondary | ICD-10-CM | POA: Diagnosis not present

## 2020-02-28 DIAGNOSIS — D649 Anemia, unspecified: Secondary | ICD-10-CM | POA: Diagnosis not present

## 2020-02-28 DIAGNOSIS — E7849 Other hyperlipidemia: Secondary | ICD-10-CM | POA: Diagnosis not present

## 2020-02-28 DIAGNOSIS — E1169 Type 2 diabetes mellitus with other specified complication: Secondary | ICD-10-CM | POA: Diagnosis not present

## 2020-02-28 DIAGNOSIS — M25551 Pain in right hip: Secondary | ICD-10-CM | POA: Diagnosis not present

## 2020-02-29 ENCOUNTER — Other Ambulatory Visit (INDEPENDENT_AMBULATORY_CARE_PROVIDER_SITE_OTHER): Payer: Self-pay

## 2020-02-29 DIAGNOSIS — D638 Anemia in other chronic diseases classified elsewhere: Secondary | ICD-10-CM | POA: Diagnosis not present

## 2020-02-29 DIAGNOSIS — I129 Hypertensive chronic kidney disease with stage 1 through stage 4 chronic kidney disease, or unspecified chronic kidney disease: Secondary | ICD-10-CM | POA: Diagnosis not present

## 2020-02-29 DIAGNOSIS — N393 Stress incontinence (female) (male): Secondary | ICD-10-CM | POA: Diagnosis not present

## 2020-02-29 DIAGNOSIS — N1831 Chronic kidney disease, stage 3a: Secondary | ICD-10-CM | POA: Diagnosis not present

## 2020-02-29 DIAGNOSIS — I5032 Chronic diastolic (congestive) heart failure: Secondary | ICD-10-CM | POA: Diagnosis not present

## 2020-03-01 ENCOUNTER — Other Ambulatory Visit (HOSPITAL_COMMUNITY): Payer: Medicare Other

## 2020-03-03 ENCOUNTER — Encounter (INDEPENDENT_AMBULATORY_CARE_PROVIDER_SITE_OTHER): Payer: Self-pay

## 2020-03-03 ENCOUNTER — Other Ambulatory Visit (INDEPENDENT_AMBULATORY_CARE_PROVIDER_SITE_OTHER): Payer: Self-pay

## 2020-03-05 ENCOUNTER — Encounter (HOSPITAL_COMMUNITY): Admission: RE | Payer: Self-pay | Source: Other Acute Inpatient Hospital

## 2020-03-05 ENCOUNTER — Ambulatory Visit (HOSPITAL_COMMUNITY)
Admission: RE | Admit: 2020-03-05 | Payer: Medicare Other | Source: Other Acute Inpatient Hospital | Admitting: Orthopedic Surgery

## 2020-03-05 SURGERY — ARTHROPLASTY, HIP, TOTAL, ANTERIOR APPROACH
Anesthesia: Choice | Site: Hip | Laterality: Right

## 2020-03-07 DIAGNOSIS — Z23 Encounter for immunization: Secondary | ICD-10-CM | POA: Diagnosis not present

## 2020-03-07 DIAGNOSIS — Z1231 Encounter for screening mammogram for malignant neoplasm of breast: Secondary | ICD-10-CM | POA: Diagnosis not present

## 2020-04-01 ENCOUNTER — Encounter (HOSPITAL_COMMUNITY)
Admission: RE | Disposition: A | Discharge status: Home / Self Care | Payer: Self-pay | Source: Home / Self Care | Attending: Gastroenterology

## 2020-04-01 ENCOUNTER — Ambulatory Visit (HOSPITAL_COMMUNITY)
Admission: RE | Admit: 2020-04-01 | Discharge: 2020-04-01 | Disposition: A | Discharge status: Home / Self Care | Payer: Medicare Other | Attending: Gastroenterology | Admitting: Gastroenterology

## 2020-04-01 DIAGNOSIS — D649 Anemia, unspecified: Secondary | ICD-10-CM | POA: Insufficient documentation

## 2020-04-01 DIAGNOSIS — K2970 Gastritis, unspecified, without bleeding: Secondary | ICD-10-CM | POA: Diagnosis not present

## 2020-04-01 DIAGNOSIS — D509 Iron deficiency anemia, unspecified: Secondary | ICD-10-CM

## 2020-04-01 HISTORY — PX: GIVENS CAPSULE STUDY: SHX5432

## 2020-04-01 SURGERY — IMAGING PROCEDURE, GI TRACT, INTRALUMINAL, VIA CAPSULE

## 2020-04-02 DIAGNOSIS — I5032 Chronic diastolic (congestive) heart failure: Secondary | ICD-10-CM | POA: Diagnosis not present

## 2020-04-02 DIAGNOSIS — I129 Hypertensive chronic kidney disease with stage 1 through stage 4 chronic kidney disease, or unspecified chronic kidney disease: Secondary | ICD-10-CM | POA: Diagnosis not present

## 2020-04-02 DIAGNOSIS — D638 Anemia in other chronic diseases classified elsewhere: Secondary | ICD-10-CM | POA: Diagnosis not present

## 2020-04-02 NOTE — Procedures (Signed)
Small Bowel [place]ens Capsule Study Procedure date:  04/02/2019  Referring Provider:  PCP PCP:  Dr. Neale Burly, MD  Indication for procedure:  Normocytic anemia  Findings:  Mild gastritis (focal erythema in stomach), normal esophagus, small bowel and colon.  First Gastric image:  00:00:43 First Duodenal image: 00:32:22 First Cecal image: 03:20:38 Gastric Passage time: 00h 61m Small Bowel Passage time:  02h 36m    Summary & Recommendations: - Will refer to hematology for further evaluation of normocytic anemia - Continue oral iron for now - No further endoscopic intervention warranted  I personally communicated these recommendations to the patient  Maylon Peppers, MD Gastroenterology and Hepatology [facility]

## 2020-04-04 ENCOUNTER — Encounter (HOSPITAL_COMMUNITY): Payer: Self-pay | Admitting: Gastroenterology

## 2020-04-04 DIAGNOSIS — I5032 Chronic diastolic (congestive) heart failure: Secondary | ICD-10-CM | POA: Diagnosis not present

## 2020-04-04 DIAGNOSIS — D508 Other iron deficiency anemias: Secondary | ICD-10-CM | POA: Diagnosis not present

## 2020-04-04 DIAGNOSIS — D638 Anemia in other chronic diseases classified elsewhere: Secondary | ICD-10-CM | POA: Diagnosis not present

## 2020-04-04 DIAGNOSIS — E1122 Type 2 diabetes mellitus with diabetic chronic kidney disease: Secondary | ICD-10-CM | POA: Diagnosis not present

## 2020-04-04 DIAGNOSIS — R809 Proteinuria, unspecified: Secondary | ICD-10-CM | POA: Diagnosis not present

## 2020-04-04 DIAGNOSIS — N189 Chronic kidney disease, unspecified: Secondary | ICD-10-CM | POA: Diagnosis not present

## 2020-04-04 DIAGNOSIS — I129 Hypertensive chronic kidney disease with stage 1 through stage 4 chronic kidney disease, or unspecified chronic kidney disease: Secondary | ICD-10-CM | POA: Diagnosis not present

## 2020-04-14 DIAGNOSIS — Z20822 Contact with and (suspected) exposure to covid-19: Secondary | ICD-10-CM | POA: Diagnosis not present

## 2020-04-14 DIAGNOSIS — Z1152 Encounter for screening for COVID-19: Secondary | ICD-10-CM | POA: Diagnosis not present

## 2020-04-22 DIAGNOSIS — N1831 Chronic kidney disease, stage 3a: Secondary | ICD-10-CM | POA: Diagnosis not present

## 2020-04-22 DIAGNOSIS — I10 Essential (primary) hypertension: Secondary | ICD-10-CM | POA: Diagnosis not present

## 2020-04-22 DIAGNOSIS — E1121 Type 2 diabetes mellitus with diabetic nephropathy: Secondary | ICD-10-CM | POA: Diagnosis not present

## 2020-04-22 DIAGNOSIS — G4730 Sleep apnea, unspecified: Secondary | ICD-10-CM | POA: Diagnosis not present

## 2020-04-22 DIAGNOSIS — D649 Anemia, unspecified: Secondary | ICD-10-CM | POA: Diagnosis not present

## 2020-04-22 DIAGNOSIS — M069 Rheumatoid arthritis, unspecified: Secondary | ICD-10-CM | POA: Diagnosis not present

## 2020-04-24 DIAGNOSIS — E1140 Type 2 diabetes mellitus with diabetic neuropathy, unspecified: Secondary | ICD-10-CM | POA: Diagnosis not present

## 2020-04-24 DIAGNOSIS — I739 Peripheral vascular disease, unspecified: Secondary | ICD-10-CM | POA: Diagnosis not present

## 2020-04-24 DIAGNOSIS — M79672 Pain in left foot: Secondary | ICD-10-CM | POA: Diagnosis not present

## 2020-04-24 DIAGNOSIS — B353 Tinea pedis: Secondary | ICD-10-CM | POA: Diagnosis not present

## 2020-04-24 DIAGNOSIS — M79675 Pain in left toe(s): Secondary | ICD-10-CM | POA: Diagnosis not present

## 2020-04-24 DIAGNOSIS — M79674 Pain in right toe(s): Secondary | ICD-10-CM | POA: Diagnosis not present

## 2020-04-24 DIAGNOSIS — M79671 Pain in right foot: Secondary | ICD-10-CM | POA: Diagnosis not present

## 2020-05-09 DIAGNOSIS — D631 Anemia in chronic kidney disease: Secondary | ICD-10-CM | POA: Diagnosis not present

## 2020-05-09 DIAGNOSIS — N1831 Chronic kidney disease, stage 3a: Secondary | ICD-10-CM | POA: Diagnosis not present

## 2020-05-09 DIAGNOSIS — D509 Iron deficiency anemia, unspecified: Secondary | ICD-10-CM | POA: Diagnosis not present

## 2020-05-16 DIAGNOSIS — D509 Iron deficiency anemia, unspecified: Secondary | ICD-10-CM | POA: Diagnosis not present

## 2020-05-16 DIAGNOSIS — D631 Anemia in chronic kidney disease: Secondary | ICD-10-CM | POA: Diagnosis not present

## 2020-05-16 DIAGNOSIS — N1831 Chronic kidney disease, stage 3a: Secondary | ICD-10-CM | POA: Diagnosis not present

## 2020-05-20 DIAGNOSIS — N390 Urinary tract infection, site not specified: Secondary | ICD-10-CM | POA: Diagnosis not present

## 2020-05-28 DIAGNOSIS — D508 Other iron deficiency anemias: Secondary | ICD-10-CM | POA: Diagnosis not present

## 2020-05-28 DIAGNOSIS — I5032 Chronic diastolic (congestive) heart failure: Secondary | ICD-10-CM | POA: Diagnosis not present

## 2020-05-28 DIAGNOSIS — I129 Hypertensive chronic kidney disease with stage 1 through stage 4 chronic kidney disease, or unspecified chronic kidney disease: Secondary | ICD-10-CM | POA: Diagnosis not present

## 2020-05-28 DIAGNOSIS — R809 Proteinuria, unspecified: Secondary | ICD-10-CM | POA: Diagnosis not present

## 2020-05-28 DIAGNOSIS — D638 Anemia in other chronic diseases classified elsewhere: Secondary | ICD-10-CM | POA: Diagnosis not present

## 2020-06-04 DIAGNOSIS — K3184 Gastroparesis: Secondary | ICD-10-CM | POA: Diagnosis not present

## 2020-06-06 DIAGNOSIS — E1122 Type 2 diabetes mellitus with diabetic chronic kidney disease: Secondary | ICD-10-CM | POA: Diagnosis not present

## 2020-06-06 DIAGNOSIS — D638 Anemia in other chronic diseases classified elsewhere: Secondary | ICD-10-CM | POA: Diagnosis not present

## 2020-06-06 DIAGNOSIS — N189 Chronic kidney disease, unspecified: Secondary | ICD-10-CM | POA: Diagnosis not present

## 2020-06-06 DIAGNOSIS — I5032 Chronic diastolic (congestive) heart failure: Secondary | ICD-10-CM | POA: Diagnosis not present

## 2020-06-06 DIAGNOSIS — E1129 Type 2 diabetes mellitus with other diabetic kidney complication: Secondary | ICD-10-CM | POA: Diagnosis not present

## 2020-06-06 DIAGNOSIS — I129 Hypertensive chronic kidney disease with stage 1 through stage 4 chronic kidney disease, or unspecified chronic kidney disease: Secondary | ICD-10-CM | POA: Diagnosis not present

## 2020-06-06 DIAGNOSIS — R809 Proteinuria, unspecified: Secondary | ICD-10-CM | POA: Diagnosis not present

## 2020-06-12 ENCOUNTER — Telehealth: Payer: Self-pay | Admitting: Cardiology

## 2020-06-12 NOTE — Telephone Encounter (Signed)
   [facility] Pre-operative Risk Assessment    HEARTCARE STAFF: - Please ensure there is not already an duplicate clearance open for this procedure. - Under Visit Info/Reason for Call, type in Other and utilize the format Clearance MM/DD/YY or Clearance TBD. Do not use dashes or single digits. - If request is for dental extraction, please clarify the # of teeth to be extracted.  Request for surgical clearance:  1. What type of surgery is being performed? Right total hip arthroplasty-Anterior  2. When is this surgery scheduled? 08/20/20   3. What type of clearance is required (medical clearance vs. Pharmacy clearance to hold med vs. Both)? Both  4. Are there any medications that need to be held prior to surgery and how long? Specify and Provide Aspirin Instructions  5. Practice name and name of physician performing surgery?     Dr. Hector Shade at [facility]   6. What is the office phone number? [phone](LEZVClaiborne Billings [phone]   7.   What is the office fax number?  [phone](JSCB) send to Glendale Chard Fax# [phone]  8.   Anesthesia type (None, local, MAC, general) ? Not specified   Nelda Marseille 06/12/2020, 11:06 AM  _________________________________________________________________   (provider comments below)

## 2020-06-12 NOTE — Telephone Encounter (Signed)
Left a voice message for the patient to give the office a call to get scheduled for a pre-op clearance appointment

## 2020-06-12 NOTE — Telephone Encounter (Signed)
Patient was previously cleared for surgery in October 2021 and was instructed to follow-up in 6 months.  Surgery has been delayed until June of this year which put her at least 8 months out after her last preop clearance.  I would recommend have a cardiology visit around May of this year to make sure her overall clinical condition is unchanged prior to her surgery.     Primary Cardiologist: Rozann Lesches, MD  Chart reviewed as part of pre-operative protocol coverage. Because of Diana Patterson's past medical history and time since last visit, she will require a follow-up visit in order to better assess preoperative cardiovascular risk.  Pre-op covering staff: - Please schedule appointment and call patient to inform them. If patient already had an upcoming appointment within acceptable timeframe, please add "pre-op clearance" to the appointment notes so provider is aware. - Please contact requesting surgeon's office via preferred method (i.e, phone, fax) to inform them of need for appointment prior to surgery.  If applicable, this message will also be routed to pharmacy pool and/or primary cardiologist for input on holding anticoagulant/antiplatelet agent as requested below so that this information is available to the clearing provider at time of patient's appointment.   [place], [place]  06/12/2020, 3:00 PM

## 2020-06-12 NOTE — Telephone Encounter (Signed)
Called and left a voice message for Glendale Chard the surgery scheduler.

## 2020-06-13 NOTE — Telephone Encounter (Signed)
1st attempt to reach pt regarding surgical clearance request and the need for an appointment. Left a message for her to call back to make an appointment with Dr. Domenic Polite or APP in [place].

## 2020-06-16 NOTE — Telephone Encounter (Signed)
Pt has been scheduled to see Diana July, NP 07/21/20 for pre op clearance. Pt thanked me for the call. I will forward notes to NP for upcoming appt.

## 2020-06-24 ENCOUNTER — Encounter: Payer: Self-pay | Admitting: Cardiology

## 2020-06-24 NOTE — Progress Notes (Signed)
Cardiology Office Note  Date: 06/25/2020   ID: Diana Patterson, DOB [birth year], MRN [id number]  PCP:  Neale Burly, MD  Cardiologist:  Rozann Lesches, MD Electrophysiologist:  None   Chief Complaint  Patient presents with  . Cardiac follow-up    History of Present Illness: Diana Patterson is a 75 y.o. female last seen in October 2021 by Mr. Leonides Sake NP.  She presents to the office for cardiac assessment prior to surgery. She is scheduled for right total hip arthroplasty on April 18 with Dr. Wynelle Link.  She is here today with her daughter and a caregiver.  Reports doing well from a cardiac perspective, no angina symptoms or nitroglycerin use.  She is using a walker to get around due to hip pain.  She has already had both of her knees replaced.  Last coronary intervention was DES to the circumflex in 2017, moderate residual LAD disease managed medically at that point.  Follow-up Myoview did not indicate any anterior distribution ischemia.  We went over her medications which are outlined below.  Cardiac regimen has been stable.  I personally reviewed her ECG today which shows normal sinus rhythm.  Past Medical History:  Diagnosis Date  . Arthritis   . CAD (coronary artery disease)    DES to circumflex June 2017, moderate residual LAD disease  . CKD (chronic kidney disease) stage 3, GFR 30-59 ml/min (HCC)   . Essential hypertension   . Mixed hyperlipidemia   . Muscle cramps at night   . NSTEMI (non-ST elevated myocardial infarction) ([place]) 2016   Probable type II event, negative Cardiolite 2012  . Sleep apnea   . Stroke ([place])    tia  . TIA (transient ischemic attack)   . Type 2 diabetes mellitus ([place])     Past Surgical History:  Procedure Laterality Date  . CARDIAC CATHETERIZATION N/A 08/23/2015   Procedure: Left Heart Cath and Coronary Angiography;  Surgeon: Leonie Man, MD;  Location: [facility];  Service: Cardiovascular;  Laterality: N/A;  .  CARDIAC CATHETERIZATION N/A 08/23/2015   Procedure: Coronary Stent Intervention;  Surgeon: Leonie Man, MD;  Location: [facility];  Service: Cardiovascular;  Laterality: N/A;  . CHOLECYSTECTOMY    . EYE SURGERY  1990s  . GIVENS CAPSULE STUDY N/A 04/01/2020   Procedure: GIVENS CAPSULE STUDY;  Surgeon: Harvel Quale, MD;  [facility];  Service: Gastroenterology;  Laterality: N/A;  7:30  . KNEE ARTHROPLASTY Bilateral   . Tubal pregnancy removal      Current Outpatient Medications  Medication Sig Dispense Refill  . acetaminophen (TYLENOL) 650 MG CR tablet Take 1,300-1,950 mg by mouth every 8 (eight) hours as needed for pain.    [name] ALPRAZolam (XANAX) 0.5 MG tablet Take 0.5 mg by mouth at bedtime as needed for anxiety.    . ASPIRIN LOW DOSE 81 MG EC tablet Take 81 mg by mouth daily.    [name] aspirin-sod bicarb-citric acid (ALKA-SELTZER) 325 MG TBEF tablet Take 650 mg by mouth every 6 (six) hours as needed (upset stomach).    [name] atorvastatin (LIPITOR) 80 MG tablet Take 1 tablet (80 mg total) by mouth daily at 6 PM. 30 tablet 5  . Calcium Carb-Cholecalciferol 1000-800 MG-UNIT TABS Take 1 tablet by mouth daily.    . Carboxymethylcellul-Glycerin (LUBRICATING EYE DROPS OP) Place 1 drop into both eyes daily as needed (dry eyes).    . Choline Fenofibrate 135 MG capsule Take 135 mg  by mouth daily in the afternoon.    . clotrimazole-betamethasone (LOTRISONE) cream Apply 1 application topically daily.    [name] Dexlansoprazole (DEXILANT) 30 MG capsule Take 30 mg by mouth daily.    . diclofenac Sodium (VOLTAREN) 1 % GEL Apply 2 g topically in the morning and at bedtime.    [name] econazole nitrate 1 % cream Apply 1 application topically daily as needed (irritation).     . folic acid (FOLVITE) 1 MG tablet Take 1 mg by mouth See admin instructions. Take 1 mg by mouth daily except for Saturday    . furosemide (LASIX) 20 MG tablet Take 20 mg by mouth daily.     . isosorbide dinitrate (ISORDIL)  30 MG tablet Take 1 tablet (30 mg total) by mouth daily. 30 tablet 5  . levocetirizine (XYZAL) 5 MG tablet Take 5 mg by mouth daily in the afternoon.     [name] losartan-hydrochlorothiazide (HYZAAR) 50-12.5 MG tablet Take 1 tablet by mouth daily.    . metaxalone (SKELAXIN) 800 MG tablet Take 800 mg by mouth 2 (two) times daily.     . metFORMIN (GLUCOPHAGE) 1000 MG tablet Take 1 tablet by mouth daily.    . methotrexate (RHEUMATREX) 2.5 MG tablet Take 20 mg by mouth every Saturday.     . metoprolol tartrate (LOPRESSOR) 50 MG tablet Take 25 mg by mouth 2 (two) times daily.    . montelukast (SINGULAIR) 10 MG tablet Take 10 mg by mouth daily in the afternoon.    . nitroGLYCERIN (NITROSTAT) 0.4 MG SL tablet Place 1 tablet (0.4 mg total) under the tongue every 5 (five) minutes as needed for chest pain. 25 tablet 3  . Omega-3 Fatty Acids (FISH OIL) 1000 MG CAPS Take 1 capsule by mouth 3 (three) times daily.    . ondansetron (ZOFRAN) 4 MG tablet Take 4 mg by mouth every 6 (six) hours as needed for nausea or vomiting.    . Polysaccharide Iron Complex (POLY-IRON 150 PO) Take 150 mg by mouth 3 (three) times daily.    . potassium chloride SA (K-DUR,KLOR-CON) 20 MEQ tablet Take 20 mEq by mouth daily.    . valACYclovir (VALTREX) 500 MG tablet Take 500 mg by mouth daily.    . vitamin B-12 (CYANOCOBALAMIN) 1000 MCG tablet Take 1,000 mcg by mouth daily.     No current facility-administered medications for this visit.   Allergies:  Morphine and related   ROS: No palpitations or syncope.  Physical Exam: VS:  BP (!) 145/70   Pulse 74   Ht 5\' 6"  (1.676 m)   Wt 168 lb (76.2 kg)   SpO2 98%   BMI 27.12 kg/m , BMI Body mass index is 27.12 kg/m.  Wt Readings from Last 3 Encounters:  06/25/20 168 lb (76.2 kg)  01/18/20 167 lb (75.8 kg)  01/10/20 169 lb (76.7 kg)    General: Patient appears comfortable at rest. HEENT: Conjunctiva and lids normal, wearing a mask. Neck: Supple, no elevated JVP or carotid bruits,  no thyromegaly. Lungs: Clear to auscultation, nonlabored breathing at rest. Cardiac: Regular rate and rhythm, no S3, soft systolic murmur, no pericardial rub. Extremities: No pitting edema.  ECG:  An ECG dated 07/25/2019 was personally reviewed today and demonstrated:  Sinus rhythm with low voltage.  Recent Labwork: 02/22/2020: ALT 32; AST 43; BUN 19; Creatinine, Ser 1.02; Hemoglobin 9.6; Platelets 198; Potassium 4.6; Sodium 140  March 2022: Hemoglobin 9.7, platelets 250, BUN 21, creatinine 1.24, potassium 4.3  Other Studies  Reviewed Today:  Cardiac catheterization 08/23/2015:  Prox Cx to Mid Cx lesion, 90% stenosed. Post intervention aspiration thrombectomy followed by Promus Premier DES 3.5 mm x 12 mm (3.75 mm) , there is a 0% residual stenosis.  Prox LAD-2 lesion, 50-75% stenosed.  The left ventricular systolic function is normal.  Elevated LVEDP   Likely culprit lesion was the thrombotic stenosis of the circumflex. It would appear that there was reperfusion after heparin and aspirin was given in the emergency room. This would be consistent with her improved chest pain upon arrival.  Successful PCI of the proximal dominant Circumflex. There is residual disease in the proximal-mid LAD prior to D1. This does not appear to be flow-limiting, however would probably benefit from being investigated.  Echocardiogram 08/25/2015: - Left ventricle: The cavity size was normal. Wall thickness was  increased in a pattern of mild LVH. Systolic function was normal.  The estimated ejection fraction was in the range of 60% to 65%.  Wall motion was normal; there were no regional wall motion  abnormalities. Doppler parameters are consistent with abnormal  left ventricular relaxation (grade 1 diastolic dysfunction).  - Mitral valve: Mildly calcified annulus.   Assessment and Plan:  1.  Preoperative cardiac evaluation prior to right total hip arthroplasty under general anesthesia on April 18.   She is clinically stable without active angina on medical therapy and her ECG is normal today.  RCRI risk calculator indicates intermediate perioperative cardiac risk, class III at 6.6% chance of major adverse cardiac event.  Acceptable for her to proceed at this time without further testing.  If necessary, aspirin can be held 7 days prior.  2.  CAD status post DES to the circumflex in 2017 with moderate residual LAD disease managed medically.  No active angina at this time.  Continue aspirin, Lipitor, Isordil, Hyzaar, Lopressor, and as needed nitroglycerin.  Medication Adjustments/Labs and Tests Ordered: Current medicines are reviewed at length with the patient today.  Concerns regarding medicines are outlined above.   Tests Ordered: Orders Placed This Encounter  Procedures  . EKG 12-Lead    Medication Changes: No orders of the defined types were placed in this encounter.   Disposition:  Follow up 6 months in the [place] office.  Signed, Satira Sark, MD, [facility] 06/25/2020 10:09 AM    [facility] [place], [place], [place] [address] Phone: [phone]; Fax: [phone]

## 2020-06-25 ENCOUNTER — Ambulatory Visit (INDEPENDENT_AMBULATORY_CARE_PROVIDER_SITE_OTHER): Payer: Medicare Other | Admitting: Cardiology

## 2020-06-25 ENCOUNTER — Encounter: Payer: Self-pay | Admitting: Cardiology

## 2020-06-25 VITALS — BP 145/70 | HR 74 | Ht 66.0 in | Wt 168.0 lb

## 2020-06-25 DIAGNOSIS — Z01810 Encounter for preprocedural cardiovascular examination: Secondary | ICD-10-CM

## 2020-06-25 DIAGNOSIS — I25119 Atherosclerotic heart disease of native coronary artery with unspecified angina pectoris: Secondary | ICD-10-CM

## 2020-06-25 NOTE — Patient Instructions (Signed)

## 2020-07-01 ENCOUNTER — Encounter (HOSPITAL_COMMUNITY): Admission: RE | Admit: 2020-07-01 | Payer: Medicare Other | Source: Ambulatory Visit

## 2020-07-01 DIAGNOSIS — N189 Chronic kidney disease, unspecified: Secondary | ICD-10-CM | POA: Diagnosis not present

## 2020-07-01 DIAGNOSIS — R109 Unspecified abdominal pain: Secondary | ICD-10-CM | POA: Diagnosis not present

## 2020-07-01 DIAGNOSIS — D500 Iron deficiency anemia secondary to blood loss (chronic): Secondary | ICD-10-CM | POA: Diagnosis not present

## 2020-07-01 DIAGNOSIS — R634 Abnormal weight loss: Secondary | ICD-10-CM | POA: Diagnosis not present

## 2020-07-01 DIAGNOSIS — I509 Heart failure, unspecified: Secondary | ICD-10-CM | POA: Diagnosis not present

## 2020-07-01 DIAGNOSIS — Z6830 Body mass index (BMI) 30.0-30.9, adult: Secondary | ICD-10-CM | POA: Diagnosis not present

## 2020-07-01 DIAGNOSIS — Z7984 Long term (current) use of oral hypoglycemic drugs: Secondary | ICD-10-CM | POA: Diagnosis not present

## 2020-07-01 DIAGNOSIS — Z7982 Long term (current) use of aspirin: Secondary | ICD-10-CM | POA: Diagnosis not present

## 2020-07-01 DIAGNOSIS — Z79899 Other long term (current) drug therapy: Secondary | ICD-10-CM | POA: Diagnosis not present

## 2020-07-01 DIAGNOSIS — E1122 Type 2 diabetes mellitus with diabetic chronic kidney disease: Secondary | ICD-10-CM | POA: Diagnosis not present

## 2020-07-01 DIAGNOSIS — I2510 Atherosclerotic heart disease of native coronary artery without angina pectoris: Secondary | ICD-10-CM | POA: Diagnosis not present

## 2020-07-01 DIAGNOSIS — I110 Hypertensive heart disease with heart failure: Secondary | ICD-10-CM | POA: Diagnosis not present

## 2020-07-01 DIAGNOSIS — E0822 Diabetes mellitus due to underlying condition with diabetic chronic kidney disease: Secondary | ICD-10-CM | POA: Diagnosis not present

## 2020-07-01 DIAGNOSIS — I130 Hypertensive heart and chronic kidney disease with heart failure and stage 1 through stage 4 chronic kidney disease, or unspecified chronic kidney disease: Secondary | ICD-10-CM | POA: Diagnosis not present

## 2020-07-03 DIAGNOSIS — Z8719 Personal history of other diseases of the digestive system: Secondary | ICD-10-CM | POA: Diagnosis not present

## 2020-07-03 DIAGNOSIS — Q631 Lobulated, fused and horseshoe kidney: Secondary | ICD-10-CM | POA: Diagnosis not present

## 2020-07-03 DIAGNOSIS — I700 Atherosclerosis of aorta: Secondary | ICD-10-CM | POA: Diagnosis not present

## 2020-07-03 DIAGNOSIS — D509 Iron deficiency anemia, unspecified: Secondary | ICD-10-CM | POA: Diagnosis not present

## 2020-07-03 DIAGNOSIS — R634 Abnormal weight loss: Secondary | ICD-10-CM | POA: Diagnosis not present

## 2020-07-03 DIAGNOSIS — K922 Gastrointestinal hemorrhage, unspecified: Secondary | ICD-10-CM | POA: Diagnosis not present

## 2020-07-03 DIAGNOSIS — R109 Unspecified abdominal pain: Secondary | ICD-10-CM | POA: Diagnosis not present

## 2020-07-03 DIAGNOSIS — I2510 Atherosclerotic heart disease of native coronary artery without angina pectoris: Secondary | ICD-10-CM | POA: Diagnosis not present

## 2020-07-03 DIAGNOSIS — N281 Cyst of kidney, acquired: Secondary | ICD-10-CM | POA: Diagnosis not present

## 2020-07-10 DIAGNOSIS — M79672 Pain in left foot: Secondary | ICD-10-CM | POA: Diagnosis not present

## 2020-07-10 DIAGNOSIS — B353 Tinea pedis: Secondary | ICD-10-CM | POA: Diagnosis not present

## 2020-07-10 DIAGNOSIS — M79675 Pain in left toe(s): Secondary | ICD-10-CM | POA: Diagnosis not present

## 2020-07-10 DIAGNOSIS — M79671 Pain in right foot: Secondary | ICD-10-CM | POA: Diagnosis not present

## 2020-07-10 DIAGNOSIS — I739 Peripheral vascular disease, unspecified: Secondary | ICD-10-CM | POA: Diagnosis not present

## 2020-07-10 DIAGNOSIS — E1140 Type 2 diabetes mellitus with diabetic neuropathy, unspecified: Secondary | ICD-10-CM | POA: Diagnosis not present

## 2020-07-10 DIAGNOSIS — M79674 Pain in right toe(s): Secondary | ICD-10-CM | POA: Diagnosis not present

## 2020-07-21 ENCOUNTER — Ambulatory Visit: Payer: Medicare Other | Admitting: Family Medicine

## 2020-07-22 DIAGNOSIS — M069 Rheumatoid arthritis, unspecified: Secondary | ICD-10-CM | POA: Diagnosis not present

## 2020-07-22 DIAGNOSIS — D649 Anemia, unspecified: Secondary | ICD-10-CM | POA: Diagnosis not present

## 2020-07-22 DIAGNOSIS — I10 Essential (primary) hypertension: Secondary | ICD-10-CM | POA: Diagnosis not present

## 2020-07-22 DIAGNOSIS — E1121 Type 2 diabetes mellitus with diabetic nephropathy: Secondary | ICD-10-CM | POA: Diagnosis not present

## 2020-07-22 DIAGNOSIS — K3184 Gastroparesis: Secondary | ICD-10-CM | POA: Diagnosis not present

## 2020-07-22 DIAGNOSIS — N1831 Chronic kidney disease, stage 3a: Secondary | ICD-10-CM | POA: Diagnosis not present

## 2020-07-23 DIAGNOSIS — D500 Iron deficiency anemia secondary to blood loss (chronic): Secondary | ICD-10-CM | POA: Diagnosis not present

## 2020-07-23 DIAGNOSIS — R634 Abnormal weight loss: Secondary | ICD-10-CM | POA: Diagnosis not present

## 2020-07-23 DIAGNOSIS — K2970 Gastritis, unspecified, without bleeding: Secondary | ICD-10-CM | POA: Diagnosis not present

## 2020-07-23 DIAGNOSIS — N1831 Chronic kidney disease, stage 3a: Secondary | ICD-10-CM | POA: Diagnosis not present

## 2020-07-23 DIAGNOSIS — Z79899 Other long term (current) drug therapy: Secondary | ICD-10-CM | POA: Diagnosis not present

## 2020-07-23 DIAGNOSIS — B9681 Helicobacter pylori [H. pylori] as the cause of diseases classified elsewhere: Secondary | ICD-10-CM | POA: Diagnosis not present

## 2020-07-23 DIAGNOSIS — N183 Chronic kidney disease, stage 3 unspecified: Secondary | ICD-10-CM | POA: Diagnosis not present

## 2020-07-23 DIAGNOSIS — D573 Sickle-cell trait: Secondary | ICD-10-CM | POA: Diagnosis not present

## 2020-07-23 DIAGNOSIS — M0609 Rheumatoid arthritis without rheumatoid factor, multiple sites: Secondary | ICD-10-CM | POA: Diagnosis not present

## 2020-08-06 NOTE — Progress Notes (Signed)
DUE TO COVID-19 ONLY ONE VISITOR IS ALLOWED TO COME WITH YOU AND STAY IN THE WAITING ROOM ONLY DURING PRE OP AND PROCEDURE DAY OF SURGERY. THE 1 VISITOR  MAY VISIT WITH YOU AFTER SURGERY IN YOUR PRIVATE ROOM DURING VISITING HOURS ONLY!  YOU NEED TO HAVE A COVID 19 TEST ON__5/30/2022 _____ @_______ , THIS TEST MUST BE DONE BEFORE SURGERY,  COVID TESTING SITE [address] [place] [address], IT IS ON THE RIGHT GOING OUT [address] APPROXIMATELY  2 MINUTES PAST [employer] ON THE RIGHT. ONCE YOUR COVID TEST IS COMPLETED,  PLEASE BEGIN THE QUARANTINE INSTRUCTIONS AS OUTLINED IN YOUR HANDOUT.                Diana Patterson  08/06/2020   Your procedure is scheduled on:  08/20/2020 Come to [facility] Main  Entrance   Report to admitting at    200pm     Call this number if you have problems the morning of surgery [phone]    REMEMBER: NO  SOLID FOOD CANDY OR GUM AFTER MIDNIGHT. CLEAR LIQUIDS UNTIL    130pm       . NOTHING BY MOUTH EXCEPT CLEAR LIQUIDS UNTIL      130pm . PLEASE FINISH ENSURE DRINK PER SURGEON ORDER  WHICH NEEDS TO BE COMPLETED AT   130pm   .      CLEAR LIQUID DIET   Foods Allowed                                                                    Coffee and tea, regular and decaf                            Fruit ices (not with fruit pulp)                                      Iced Popsicles                                    Carbonated beverages, regular and diet                                    Cranberry, grape and apple juices Sports drinks like Gatorade Lightly seasoned clear broth or consume(fat free) Sugar, honey syrup ___________________________________________________________________      BRUSH YOUR TEETH MORNING OF SURGERY AND RINSE YOUR MOUTH OUT, NO CHEWING GUM CANDY OR MINTS.     Take these medicines the morning of surgery with A SIP OF WATER:                 Metoprolol, dexilant, isordil  DO NOT TAKE ANY  DIABETIC MEDICATIONS DAY OF YOUR SURGERY                               You may not have any metal on your body including hair pins  and              piercings  Do not wear jewelry, make-up, lotions, powders or perfumes, deodorant             Do not wear nail polish on your fingernails.  Do not shave  48 hours prior to surgery.              Men may shave face and neck.   Do not bring valuables to the hospital. [place].  Contacts, dentures or bridgework may not be worn into surgery.  Leave suitcase in the car. After surgery it may be brought to your room.     Patients discharged the day of surgery will not be allowed to drive home. IF YOU ARE HAVING SURGERY AND GOING HOME THE SAME DAY, YOU MUST HAVE AN ADULT TO DRIVE YOU HOME AND BE WITH YOU FOR 24 HOURS. YOU MAY GO HOME BY TAXI OR UBER OR ORTHERWISE, BUT AN ADULT MUST ACCOMPANY YOU HOME AND STAY WITH YOU FOR 24 HOURS.  Name and phone number of your driver:  Special Instructions: N/A              Please read over the following fact sheets you were given: _____________________________________________________________________  [facility] - Preparing for Surgery Before surgery, you can play an important role.  Because skin is not sterile, your skin needs to be as free of germs as possible.  You can reduce the number of germs on your skin by washing with CHG (chlorahexidine gluconate) soap before surgery.  CHG is an antiseptic cleaner which kills germs and bonds with the skin to continue killing germs even after washing. Please DO NOT use if you have an allergy to CHG or antibacterial soaps.  If your skin becomes reddened/irritated stop using the CHG and inform your nurse when you arrive at Short Stay. Do not shave (including legs and underarms) for at least 48 hours prior to the first CHG shower.  You may shave your face/neck. Please follow these instructions carefully:  1.  Shower with CHG Soap  the night before surgery and the  morning of Surgery.  2.  If you choose to wash your hair, wash your hair first as usual with your  normal  shampoo.  3.  After you shampoo, rinse your hair and body thoroughly to remove the  shampoo.                           4.  Use CHG as you would any other liquid soap.  You can apply chg directly  to the skin and wash                       Gently with a scrungie or clean washcloth.  5.  Apply the CHG Soap to your body ONLY FROM THE NECK DOWN.   Do not use on face/ open                           Wound or open sores. Avoid contact with eyes, ears mouth and genitals (private parts).                       Wash face,  Genitals (private parts) with your normal  soap.             6.  Wash thoroughly, paying special attention to the area where your surgery  will be performed.  7.  Thoroughly rinse your body with warm water from the neck down.  8.  DO NOT shower/wash with your normal soap after using and rinsing off  the CHG Soap.                9.  Pat yourself dry with a clean towel.            10.  Wear clean pajamas.            11.  Place clean sheets on your bed the night of your first shower and do not  sleep with pets. Day of Surgery : Do not apply any lotions/deodorants the morning of surgery.  Please wear clean clothes to the hospital/surgery center.  FAILURE TO FOLLOW THESE INSTRUCTIONS MAY RESULT IN THE CANCELLATION OF YOUR SURGERY PATIENT SIGNATURE_________________________________  NURSE SIGNATURE__________________________________  ________________________________________________________________________

## 2020-08-11 ENCOUNTER — Other Ambulatory Visit (HOSPITAL_COMMUNITY): Payer: Medicare Other

## 2020-08-11 ENCOUNTER — Encounter (HOSPITAL_COMMUNITY)
Admission: RE | Admit: 2020-08-11 | Discharge: 2020-08-11 | Disposition: A | Discharge status: Home / Self Care | Payer: Medicare Other | Source: Ambulatory Visit | Attending: Orthopedic Surgery | Admitting: Orthopedic Surgery

## 2020-08-19 DIAGNOSIS — D508 Other iron deficiency anemias: Secondary | ICD-10-CM | POA: Diagnosis not present

## 2020-08-19 DIAGNOSIS — D500 Iron deficiency anemia secondary to blood loss (chronic): Secondary | ICD-10-CM | POA: Diagnosis not present

## 2020-08-19 DIAGNOSIS — D573 Sickle-cell trait: Secondary | ICD-10-CM | POA: Diagnosis not present

## 2020-08-20 ENCOUNTER — Ambulatory Visit (HOSPITAL_COMMUNITY): Admission: RE | Admit: 2020-08-20 | Payer: Medicare Other | Source: Home / Self Care | Admitting: Orthopedic Surgery

## 2020-08-20 ENCOUNTER — Encounter (HOSPITAL_COMMUNITY): Admission: RE | Payer: Self-pay | Source: Home / Self Care

## 2020-08-20 DIAGNOSIS — D509 Iron deficiency anemia, unspecified: Secondary | ICD-10-CM | POA: Diagnosis not present

## 2020-08-20 DIAGNOSIS — N1831 Chronic kidney disease, stage 3a: Secondary | ICD-10-CM | POA: Diagnosis not present

## 2020-08-20 SURGERY — ARTHROPLASTY, HIP, TOTAL, ANTERIOR APPROACH
Anesthesia: Choice | Site: Hip | Laterality: Right

## 2020-08-25 DIAGNOSIS — N1831 Chronic kidney disease, stage 3a: Secondary | ICD-10-CM | POA: Diagnosis not present

## 2020-08-25 DIAGNOSIS — D509 Iron deficiency anemia, unspecified: Secondary | ICD-10-CM | POA: Diagnosis not present

## 2020-09-08 DIAGNOSIS — D500 Iron deficiency anemia secondary to blood loss (chronic): Secondary | ICD-10-CM | POA: Diagnosis not present

## 2020-09-10 DIAGNOSIS — D500 Iron deficiency anemia secondary to blood loss (chronic): Secondary | ICD-10-CM | POA: Diagnosis not present

## 2020-09-11 DIAGNOSIS — Z23 Encounter for immunization: Secondary | ICD-10-CM | POA: Diagnosis not present

## 2020-09-16 DIAGNOSIS — N189 Chronic kidney disease, unspecified: Secondary | ICD-10-CM | POA: Diagnosis not present

## 2020-09-17 DIAGNOSIS — I5032 Chronic diastolic (congestive) heart failure: Secondary | ICD-10-CM | POA: Diagnosis not present

## 2020-09-17 DIAGNOSIS — E1122 Type 2 diabetes mellitus with diabetic chronic kidney disease: Secondary | ICD-10-CM | POA: Diagnosis not present

## 2020-09-17 DIAGNOSIS — R809 Proteinuria, unspecified: Secondary | ICD-10-CM | POA: Diagnosis not present

## 2020-09-17 DIAGNOSIS — I129 Hypertensive chronic kidney disease with stage 1 through stage 4 chronic kidney disease, or unspecified chronic kidney disease: Secondary | ICD-10-CM | POA: Diagnosis not present

## 2020-09-17 DIAGNOSIS — D638 Anemia in other chronic diseases classified elsewhere: Secondary | ICD-10-CM | POA: Diagnosis not present

## 2020-09-17 DIAGNOSIS — E1129 Type 2 diabetes mellitus with other diabetic kidney complication: Secondary | ICD-10-CM | POA: Diagnosis not present

## 2020-09-17 DIAGNOSIS — N189 Chronic kidney disease, unspecified: Secondary | ICD-10-CM | POA: Diagnosis not present

## 2020-09-25 ENCOUNTER — Telehealth: Payer: Self-pay | Admitting: Cardiology

## 2020-09-25 NOTE — Telephone Encounter (Signed)
   [facility] Pre-operative Risk Assessment    Patient Name: Diana Patterson  DOB: [birth year] MRN: [id number]  HEARTCARE STAFF:  - IMPORTANT!!!!!! Under Visit Info/Reason for Call, type in Other and utilize the format Clearance MM/DD/YY or Clearance TBD. Do not use dashes or single digits. - Please review there is not already an duplicate clearance open for this procedure. - If request is for dental extraction, please clarify the # of teeth to be extracted. - If the patient is currently at the dentist's office, call Pre-Op APP to address. If the patient is not currently in the dentist office, please route to the Pre-Op pool.  Request for surgical clearance:  What type of surgery is being performed? Right Total Hip Arthoplasty anterior   When is this surgery scheduled? 12/24/20  What type of clearance is required (medical clearance vs. Pharmacy clearance to hold med vs. Both)? both  Are there any medications that need to be held prior to surgery and how long? Please advise on aspirin   Practice name and name of physician performing surgery? Dr Hector Shade  What is the office phone number? [phone]   7.   What is the office fax number? [phone]  8.   Anesthesia type (None, local, MAC, general) ? TBD   Jannet Askew 09/25/2020, 7:27 AM  _________________________________________________________________   (provider comments below)

## 2020-09-25 NOTE — Telephone Encounter (Signed)
   Name: Diana Patterson  DOB: [birth year]  MRN: [id number]   Primary Cardiologist: Rozann Lesches, MD  Chart reviewed as part of pre-operative protocol coverage. Patient was contacted 09/25/2020 in reference to pre-operative risk assessment for pending surgery as outlined below.  Diana Patterson was last seen on 06/25/20 by Dr. Domenic Polite.    She has history of CAD with last coronary intervention DES to the Lcx in 2017 and showed moderate residual LAD dz, medically managed at this point. Follow-up Myoview did not indicate anterior distribution ischemia. In addition to CAD, she has history of CKD, HTN, HLD, OSA, CVA/TIA, and DM2.  At last OV, she was cleared for R total hip surgery by Dr. Domenic Polite at intermediate risk but no further cardiac testing was felt needed at that time.   I reached out to patient for update on how she is doing. The patient affirms she has been doing well without any new cardiac symptoms.  Therefore, based on ACC/AHA guidelines, the patient would be at acceptable risk for the planned procedure without further cardiovascular testing.   The patient was advised that if she develops new symptoms prior to surgery to contact our office to arrange for a follow-up visit, and she verbalized understanding.  In Dr. Myles Gip 06/25/20 note, he did reference that ASA can be held 7d prior to the surgery.  I will route this recommendation to the requesting party via Epic fax function and remove from pre-op pool. Please call with questions.  Arvil Chaco, PA-C 09/25/2020, 10:01 AM

## 2020-10-22 DIAGNOSIS — N1831 Chronic kidney disease, stage 3a: Secondary | ICD-10-CM | POA: Diagnosis not present

## 2020-10-22 DIAGNOSIS — I10 Essential (primary) hypertension: Secondary | ICD-10-CM | POA: Diagnosis not present

## 2020-10-22 DIAGNOSIS — E1121 Type 2 diabetes mellitus with diabetic nephropathy: Secondary | ICD-10-CM | POA: Diagnosis not present

## 2020-10-22 DIAGNOSIS — M069 Rheumatoid arthritis, unspecified: Secondary | ICD-10-CM | POA: Diagnosis not present

## 2020-11-03 DIAGNOSIS — H8113 Benign paroxysmal vertigo, bilateral: Secondary | ICD-10-CM | POA: Diagnosis not present

## 2020-11-21 DIAGNOSIS — H8113 Benign paroxysmal vertigo, bilateral: Secondary | ICD-10-CM | POA: Diagnosis not present

## 2020-11-21 DIAGNOSIS — R5383 Other fatigue: Secondary | ICD-10-CM | POA: Diagnosis not present

## 2020-12-02 DIAGNOSIS — N1831 Chronic kidney disease, stage 3a: Secondary | ICD-10-CM | POA: Diagnosis not present

## 2020-12-02 DIAGNOSIS — Z79899 Other long term (current) drug therapy: Secondary | ICD-10-CM | POA: Diagnosis not present

## 2020-12-02 DIAGNOSIS — D500 Iron deficiency anemia secondary to blood loss (chronic): Secondary | ICD-10-CM | POA: Diagnosis not present

## 2020-12-02 DIAGNOSIS — K760 Fatty (change of) liver, not elsewhere classified: Secondary | ICD-10-CM | POA: Diagnosis not present

## 2020-12-05 NOTE — Patient Instructions (Addendum)
DUE TO COVID-19 ONLY ONE VISITOR IS ALLOWED TO COME WITH YOU AND STAY IN THE WAITING ROOM ONLY DURING PRE OP AND PROCEDURE DAY OF SURGERY.   TWO VISITOR  MAY VISIT WITH YOU AFTER SURGERY IN YOUR PRIVATE ROOM DURING VISITING HOURS ONLY!  YOU NEED TO HAVE A COVID 19 TEST ON__10-3-22_____ Between 8am-3pm_______, THIS TEST MUST BE DONE BEFORE SURGERY,     Please bring completed form with you to the COVID testing site   COVID TESTING SITE [place] TEST IS COMPLETED,  PLEASE Wear a mask when in public           Your procedure is scheduled on: 12-24-2020   Report to [facility] Main  Entrance   Report to short stay  at        0515  AM                                          If you use a CPAP bring your mask and tubing only     Call this number if you have problems the morning of surgery [phone]   Remember: NO SOLID FOOD AFTER MIDNIGHT THE NIGHT PRIOR TO SURGERY.   NOTHING BY MOUTH EXCEPT CLEAR LIQUIDS UNTIL    0530 am .   PLEASE FINISH     G2     DRINK PER SURGEON ORDER  WHICH NEEDS TO BE COMPLETED AT   0530 am then nothing by mouth.      CLEAR LIQUID DIET                                                                    water Black Coffee and tea, regular and decaf                             Plain Jell-O any favor except red or purple                                  Fruit ices (not with fruit pulp)                                      Iced Popsicles                                     Carbonated beverages, regular and diet                                    Cranberry, grape and apple juices Sports drinks like Gatorade Lightly seasoned clear broth or consume(fat free) Sugar, honey syrup  _____________________________________________________________________     BRUSH YOUR TEETH MORNING OF SURGERY AND RINSE YOUR MOUTH OUT, NO CHEWING GUM CANDY OR MINTS.  Take these medicines the morning of  surgery with A SIP OF WATER:   DO NOT TAKE ANY DIABETIC MEDICATIONS DAY OF YOUR SURGERY                               You may not have any metal on your body including hair pins and              piercings  Do not wear jewelry, make-up, lotions, powders,perfumes,      deodorant             Do not wear nail polish on your fingernails or toenails .  Do not shave  48 hours prior to surgery.                Do not bring valuables to the hospital. [place].  Contacts, dentures or bridgework may not be worn into surgery.       Patients discharged the day of surgery will not be allowed to drive home. IF YOU ARE HAVING SURGERY AND GOING HOME THE SAME DAY, YOU MUST HAVE AN ADULT TO DRIVE YOU HOME AND BE WITH YOU FOR 24 HOURS. YOU MAY GO HOME BY TAXI OR UBER OR ORTHERWISE, BUT AN ADULT MUST ACCOMPANY YOU HOME AND STAY WITH YOU FOR 24 HOURS.  Name and phone number of your driver:  Special Instructions: N/A              Please read over the following fact sheets you were given: _____________________________________________________________________             [facility] - Preparing for Surgery Before surgery, you can play an important role.  Because skin is not sterile, your skin needs to be as free of germs as possible.  You can reduce the number of germs on your skin by washing with CHG (chlorahexidine gluconate) soap before surgery.  CHG is an antiseptic cleaner which kills germs and bonds with the skin to continue killing germs even after washing. Please DO NOT use if you have an allergy to CHG or antibacterial soaps.  If your skin becomes reddened/irritated stop using the CHG and inform your nurse when you arrive at Short Stay. Do not shave (including legs and underarms) for at least 48 hours prior to the first CHG shower.  You may shave your face/neck. Please follow these instructions carefully:  1.  Shower with CHG Soap the night before surgery  and the  morning of Surgery.  2.  If you choose to wash your hair, wash your hair first as usual with your  normal  shampoo.  3.  After you shampoo, rinse your hair and body thoroughly to remove the  shampoo.                           4.  Use CHG as you would any other liquid soap.  You can apply chg directly  to the skin and wash                       Gently with a scrungie or clean washcloth.  5.  Apply the CHG Soap to your body ONLY FROM THE NECK DOWN.   Do not use on face/ open  Wound or open sores. Avoid contact with eyes, ears mouth and genitals (private parts).                       Wash face,  Genitals (private parts) with your normal soap.             6.  Wash thoroughly, paying special attention to the area where your surgery  will be performed.  7.  Thoroughly rinse your body with warm water from the neck down.  8.  DO NOT shower/wash with your normal soap after using and rinsing off  the CHG Soap.                9.  Pat yourself dry with a clean towel.            10.  Wear clean pajamas.            11.  Place clean sheets on your bed the night of your first shower and do not  sleep with pets. Day of Surgery : Do not apply any lotions/deodorants the morning of surgery.  Please wear clean clothes to the hospital/surgery center.  FAILURE TO FOLLOW THESE INSTRUCTIONS MAY RESULT IN THE CANCELLATION OF YOUR SURGERY PATIENT SIGNATURE_________________________________  NURSE SIGNATURE__________________________________  ________________________________________________________________________    Adam Phenix  An incentive spirometer is a tool that can help keep your lungs clear and active. This tool measures how well you are filling your lungs with each breath. Taking long deep breaths may help reverse or decrease the chance of developing breathing (pulmonary) problems (especially infection) following: A long period of time when you are unable to move or be  active. BEFORE THE PROCEDURE  If the spirometer includes an indicator to show your best effort, your nurse or respiratory therapist will set it to a desired goal. If possible, sit up straight or lean slightly forward. Try not to slouch. Hold the incentive spirometer in an upright position. INSTRUCTIONS FOR USE  Sit on the edge of your bed if possible, or sit up as far as you can in bed or on a chair. Hold the incentive spirometer in an upright position. Breathe out normally. Place the mouthpiece in your mouth and seal your lips tightly around it. Breathe in slowly and as deeply as possible, raising the piston or the ball toward the top of the column. Hold your breath for 3-5 seconds or for as long as possible. Allow the piston or ball to fall to the bottom of the column. Remove the mouthpiece from your mouth and breathe out normally. Rest for a few seconds and repeat Steps 1 through 7 at least 10 times every 1-2 hours when you are awake. Take your time and take a few normal breaths between deep breaths. The spirometer may include an indicator to show your best effort. Use the indicator as a goal to work toward during each repetition. After each set of 10 deep breaths, practice coughing to be sure your lungs are clear. If you have an incision (the cut made at the time of surgery), support your incision when coughing by placing a pillow or rolled up towels firmly against it. Once you are able to get out of bed, walk around indoors and cough well. You may stop using the incentive spirometer when instructed by your caregiver.  RISKS AND COMPLICATIONS Take your time so you do not get dizzy or light-headed. If you are in pain, you may need to take or  ask for pain medication before doing incentive spirometry. It is harder to take a deep breath if you are having pain. AFTER USE Rest and breathe slowly and easily. It can be helpful to keep track of a log of your progress. Your caregiver can provide you  with a simple table to help with this. If you are using the spirometer at home, follow these instructions: [place] IF:  You are having difficultly using the spirometer. You have trouble using the spirometer as often as instructed. Your pain medication is not giving enough relief while using the spirometer. You develop fever of 100.5 F (38.1 C) or higher. SEEK IMMEDIATE MEDICAL CARE IF:  You cough up bloody sputum that had not been present before. You develop fever of 102 F (38.9 C) or greater. You develop worsening pain at or near the incision site. MAKE SURE YOU:  Understand these instructions. Will watch your condition. Will get help right away if you are not doing well or get worse. Document Released: 07/19/2006 Document Revised: 05/31/2011 Document Reviewed: 09/19/2006 [facility] Patient Information 2014 [place], [place].   ________________________________________________________________________

## 2020-12-05 NOTE — Progress Notes (Signed)
PCP -  Cardiologist - Rozann Lesches Clearance 09-25-2020 Marrianne Mood PA-C   PPM/ICD -  Device Orders -  Rep Notified -   Chest x-ray -  EKG - 06-25-2020 epic Stress Test -  ECHO -  Cardiac Cath -   Sleep Study -  CPAP -   Fasting Blood Sugar -  Checks Blood Sugar _____ times a day  Blood Thinner Instructions: Aspirin Instructions:  ERAS Protcol - PRE-SURGERY Ensure or G2-   COVID TEST- 12-22-20 COVID vaccine -  Activity-- Anesthesia review: CAD/Stent 2017, DM II, HTN, MI,Stroke, CKD  Patient denies shortness of breath, fever, cough and chest pain at PAT appointment   All instructions explained to the patient, with a verbal understanding of the material. Patient agrees to go over the instructions while at home for a better understanding. Patient also instructed to self quarantine after being tested for COVID-19. The opportunity to ask questions was provided.

## 2020-12-11 ENCOUNTER — Encounter (HOSPITAL_COMMUNITY)
Admission: RE | Admit: 2020-12-11 | Discharge: 2020-12-11 | Disposition: A | Discharge status: Home / Self Care | Payer: Medicare Other | Source: Ambulatory Visit | Attending: Orthopedic Surgery | Admitting: Orthopedic Surgery

## 2020-12-11 DIAGNOSIS — D509 Iron deficiency anemia, unspecified: Secondary | ICD-10-CM | POA: Diagnosis not present

## 2020-12-11 NOTE — H&P (Signed)
TOTAL HIP ADMISSION H&P  Patient is admitted for right total hip arthroplasty.  Subjective:  Chief Complaint: Right hip pain  HPI: Diana Patterson, 75 y.o. female, has a history of pain and functional disability in the right hip due to arthritis and patient has failed non-surgical conservative treatments for greater than 12 weeks to include corticosteriod injections, use of assistive devices, and activity modification. Onset of symptoms was  gradual , starting  several  years ago with gradually worsening course since that time. The patient noted no past surgery on the right hip. Patient currently rates pain in the right hip at 10 out of 10 with activity. Patient has night pain, worsening of pain with activity and weight bearing, pain that interfers with activities of daily living, pain with passive range of motion, and crepitus. Patient has evidence of  end-stage arthritis with bone-on-bone formation, massive osteophyte formation creating impingement, subchondral cystic formation and erosion of the femoral head  by imaging studies. This condition presents safety issues increasing the risk of falls.  There is no current active infection.  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   ST elevation myocardial infarction (STEMI) of inferior wall, initial episode of care ([place]) 08/23/2015   ST elevation myocardial infarction (STEMI) of inferior wall ([place]) 08/23/2015   Preoperative evaluation to rule out surgical contraindication 05/17/2012   CAD (coronary artery disease), native coronary artery 12/18/2010   Benign essential hypertension 12/18/2010   Type 2 diabetes mellitus ([place]) 12/18/2010   Dyslipidemia 12/18/2010   CKD (chronic kidney disease) stage 3, GFR 30-59 ml/min ([place]) 12/18/2010    Past  Medical History:  Diagnosis Date   Arthritis    CAD (coronary artery disease)    DES to circumflex June 2017, moderate residual LAD disease   CKD (chronic kidney disease) stage 3, GFR 30-59 ml/min ([facility])    Essential hypertension    Mixed hyperlipidemia    Muscle cramps at night    NSTEMI (non-ST elevated myocardial infarction) ([place]) 2016   Probable type II event, negative Cardiolite 2012   Sleep apnea    Stroke ([place])    tia   TIA (transient ischemic attack)    Type 2 diabetes mellitus ([place])     Past Surgical History:  Procedure Laterality Date   CARDIAC CATHETERIZATION N/A 08/23/2015   Procedure: Left Heart Cath and Coronary Angiography;  Surgeon: Leonie Man, MD;  Location: [facility];  Service: Cardiovascular;  Laterality: N/A;   CARDIAC CATHETERIZATION N/A 08/23/2015   Procedure: Coronary Stent Intervention;  Surgeon: Leonie Man, MD;  Location: [facility];  Service: Cardiovascular;  Laterality: N/A;   CHOLECYSTECTOMY     EYE SURGERY  1990s   GIVENS CAPSULE STUDY N/A 04/01/2020   Procedure: GIVENS CAPSULE STUDY;  Surgeon: Harvel Quale, MD;  Location: [facility];  Service: Gastroenterology;  Laterality: N/A;  7:30   KNEE ARTHROPLASTY Bilateral    Tubal pregnancy removal      Prior to Admission medications   Medication Sig Start Date End Date Taking? Authorizing Provider  acetaminophen (TYLENOL) 650 MG CR tablet Take 1,300-1,950 mg by mouth every 8 (eight) hours as needed for pain.    [provider]  ALPRAZolam Duanne Moron) 0.5 MG tablet Take 0.5 mg by mouth at bedtime  as needed for anxiety.    [provider]  ASPIRIN LOW DOSE 81 MG EC tablet Take 81 mg by mouth daily. 02/28/20   [provider]  aspirin-sod bicarb-citric acid (ALKA-SELTZER) 325 MG TBEF tablet Take 650 mg by mouth every 6 (six) hours as needed (upset stomach).    [provider]  atorvastatin (LIPITOR) 80 MG tablet Take 1 tablet (80 mg total) by  mouth daily at 6 PM. 08/26/15   Consuelo Pandy, PA-C  Calcium Carb-Cholecalciferol 1000-800 MG-UNIT TABS Take 1 tablet by mouth daily.    [provider]  Carboxymethylcellul-Glycerin (LUBRICATING EYE DROPS OP) Place 1 drop into both eyes daily as needed (dry eyes).    [provider]  Choline Fenofibrate 135 MG capsule Take 135 mg by mouth daily in the afternoon.    [provider]  clotrimazole-betamethasone (LOTRISONE) cream Apply 1 application topically daily.    [provider]  Dexlansoprazole (DEXILANT) 30 MG capsule Take 30 mg by mouth daily.    [provider]  diclofenac Sodium (VOLTAREN) 1 % GEL Apply 2 g topically in the morning and at bedtime.    [provider]  econazole nitrate 1 % cream Apply 1 application topically daily as needed (irritation).  08/28/19   [provider]  folic acid (FOLVITE) 1 MG tablet Take 1 mg by mouth See admin instructions. Take 1 mg by mouth daily except for Saturday    [provider]  furosemide (LASIX) 20 MG tablet Take 20 mg by mouth daily.     [provider]  isosorbide dinitrate (ISORDIL) 30 MG tablet Take 1 tablet (30 mg total) by mouth daily. 08/26/15   Lyda Jester M, PA-C  levocetirizine (XYZAL) 5 MG tablet Take 5 mg by mouth daily in the afternoon.     [provider]  losartan-hydrochlorothiazide (HYZAAR) 50-12.5 MG tablet Take 1 tablet by mouth daily.    [provider]  metaxalone (SKELAXIN) 800 MG tablet Take 800 mg by mouth 2 (two) times daily.     [provider]  metFORMIN (GLUCOPHAGE) 1000 MG tablet Take 1 tablet by mouth daily. 06/04/20   [provider]  methotrexate (RHEUMATREX) 2.5 MG tablet Take 20 mg by mouth every Saturday.     [provider]  metoprolol tartrate (LOPRESSOR) 50 MG tablet Take 25 mg by mouth 2 (two) times daily.    [provider]  montelukast (SINGULAIR) 10 MG tablet Take 10  mg by mouth daily in the afternoon.    [provider]  nitroGLYCERIN (NITROSTAT) 0.4 MG SL tablet Place 1 tablet (0.4 mg total) under the tongue every 5 (five) minutes as needed for chest pain. 11/10/16   Satira Sark, MD  Omega-3 Fatty Acids (FISH OIL) 1000 MG CAPS Take 1 capsule by mouth 3 (three) times daily.    [provider]  ondansetron (ZOFRAN) 4 MG tablet Take 4 mg by mouth every 6 (six) hours as needed for nausea or vomiting.    [provider]  Polysaccharide Iron Complex (POLY-IRON 150 PO) Take 150 mg by mouth 3 (three) times daily.    [provider]  potassium chloride SA (K-DUR,KLOR-CON) 20 MEQ tablet Take 20 mEq by mouth daily.    [provider]  valACYclovir (VALTREX) 500 MG tablet Take 500 mg by mouth daily.    [provider]  vitamin B-12 (CYANOCOBALAMIN) 1000 MCG tablet Take 1,000 mcg by mouth daily.    [provider]    Allergies  Allergen Reactions   Morphine And Related Other (See Comments)    Drowsiness    Social History   Socioeconomic History   Marital status: Widowed    Spouse name: Not on file   Number of children: Not on file   Years of education: Not on file   Highest education level: Not on file  Occupational History   Not on file  Tobacco Use   Smoking status: Former    Packs/day: 1.00    Years: 20.00    Pack years: 20.00    Types: Cigarettes    Quit date: 03/22/1978    Years since quitting: 42.7   Smokeless tobacco: Never  Vaping Use   Vaping Use: Never used  Substance and Sexual Activity   Alcohol use: No    Alcohol/week: 0.0 standard drinks   Drug use: No   Sexual activity: Not on file  Other Topics Concern   Not on file  Social History Narrative   Has 5 children   Social Determinants of Health   Financial Resource Strain: Not on file  Food Insecurity: Not on file  Transportation Needs: Not on file  Physical Activity: Not on file  Stress: Not on file  Social  Connections: Not on file  Intimate Partner Violence: Not on file    Tobacco Use: Medium Risk   Smoking Tobacco Use: Former   Smokeless Tobacco Use: Never   Social History   Substance and Sexual Activity  Alcohol Use No   Alcohol/week: 0.0 standard drinks    Family History  Problem Relation Age of Onset   Heart attack Father    Diabetes Sister     Review of Systems  Constitutional:  Negative for chills and fever.  HENT:  Negative for congestion, sore throat and tinnitus.   Eyes:  Negative for double vision, photophobia and pain.  Respiratory:  Negative for cough, shortness of breath and wheezing.   Cardiovascular:  Negative for chest pain, palpitations and orthopnea.  Gastrointestinal:  Negative for heartburn, nausea and vomiting.  Genitourinary:  Negative for dysuria, frequency and urgency.  Musculoskeletal:  Positive for joint pain.  Neurological:  Negative for dizziness, weakness and headaches.    Objective:  Physical Exam: Well nourished and well developed.  General: Alert and oriented x3, cooperative and pleasant, no acute distress.  Head: normocephalic, atraumatic, neck supple.  Eyes: EOMI.  Respiratory: breath sounds clear in all fields, no wheezing, rales, or rhonchi. Cardiovascular: Regular rate and rhythm, no murmurs, gallops or rubs.  Abdomen: non-tender to palpation and soft, normoactive bowel sounds. Musculoskeletal:  Right Hip Exam:  The range of motion: Flexion to 100 degrees, Internal Rotation to 0 degrees, External Rotation to 0 degrees, and abduction to 10 degrees.  There is no tenderness over the greater trochanteric bursa.   Calves soft and nontender. Motor function intact in LE. Strength 5/5 LE bilaterally. Neuro: Distal pulses 2+. Sensation to light touch intact in LE.   Imaging Review Plain radiographs demonstrate severe degenerative joint disease of the right hip. The bone quality appears to be adequate for age and reported activity  level.  Assessment/Plan:  End stage arthritis, right hip  The patient history, physical examination, clinical judgement of the provider and imaging studies are consistent with end stage degenerative joint disease of the right hip and total hip arthroplasty is deemed medically necessary. The treatment options including medical management, injection therapy, arthroscopy and arthroplasty were discussed at length. The risks and  benefits of total hip arthroplasty were presented and reviewed. The risks due to aseptic loosening, infection, stiffness, dislocation/subluxation, thromboembolic complications and other imponderables were discussed. The patient acknowledged the explanation, agreed to proceed with the plan and consent was signed. Patient is being admitted for inpatient treatment for surgery, pain control, PT, OT, prophylactic antibiotics, VTE prophylaxis, progressive ambulation and ADLs and discharge planning.The patient is planning to be discharged  home .   Patient's anticipated LOS is less than 2 midnights, meeting these requirements: - Lives within 1 hour of care - Has a competent adult at home to recover with post-op recover - NO history of  - Chronic pain requiring opioids  - Heart failure  - Heart attack  - Stroke  - DVT/VTE  - Cardiac arrhythmia  - Respiratory Failure/COPD  - Advanced Liver disease  Therapy Plans: HEP Disposition: Home with daughters Planned DVT Prophylaxis: Xarelto 10 mg QD DME Needed: Gilford Rile PCP: Volanda Napoleon, MD  Cardiologist: Rozann Lesches, MD (clearance received) Hematologists: Wanita Chamberlain, MD; Linus Mako, MD TXA: IV Allergies: Morphine (blacks out) Anesthesia Concerns: None BMI: 29.8 Last HgbA1c: 5.1% Pharmacy: [facility]  Other: - Hx of MI in 2018 with stent placement; history of chronic anemia - Appt with Dr. Federico Flake on 9/27 for epo injection, will reschedule PAT appt to 10/3 to ensure that hemoglobin is optimized prior to surgery.  Patient is aware of the rationalization behind cancelling surgery if Hgb is not high enough at recheck  - Patient was instructed on what medications to stop prior to surgery. - Follow-up visit in 2 weeks with Dr. Wynelle Link - Begin physical therapy following surgery - Pre-operative lab work as pre-surgical testing - Prescriptions will be provided in hospital at time of discharge  Theresa Duty, PA-C Orthopedic Surgery [facility] [place]

## 2020-12-12 DIAGNOSIS — K760 Fatty (change of) liver, not elsewhere classified: Secondary | ICD-10-CM | POA: Diagnosis not present

## 2020-12-12 DIAGNOSIS — Z79899 Other long term (current) drug therapy: Secondary | ICD-10-CM | POA: Diagnosis not present

## 2020-12-12 DIAGNOSIS — N1831 Chronic kidney disease, stage 3a: Secondary | ICD-10-CM | POA: Diagnosis not present

## 2020-12-16 DIAGNOSIS — I129 Hypertensive chronic kidney disease with stage 1 through stage 4 chronic kidney disease, or unspecified chronic kidney disease: Secondary | ICD-10-CM | POA: Diagnosis not present

## 2020-12-16 DIAGNOSIS — K921 Melena: Secondary | ICD-10-CM | POA: Diagnosis not present

## 2020-12-17 DIAGNOSIS — D508 Other iron deficiency anemias: Secondary | ICD-10-CM | POA: Diagnosis not present

## 2020-12-22 ENCOUNTER — Encounter (HOSPITAL_COMMUNITY): Payer: Medicare Other

## 2020-12-24 ENCOUNTER — Ambulatory Visit (HOSPITAL_COMMUNITY): Admission: RE | Admit: 2020-12-24 | Payer: Medicare Other | Source: Home / Self Care | Admitting: Orthopedic Surgery

## 2020-12-24 ENCOUNTER — Encounter (HOSPITAL_COMMUNITY): Admission: RE | Payer: Self-pay | Source: Home / Self Care

## 2020-12-24 SURGERY — ARTHROPLASTY, HIP, TOTAL, ANTERIOR APPROACH
Anesthesia: Choice | Site: Hip | Laterality: Right

## 2020-12-26 DIAGNOSIS — D709 Neutropenia, unspecified: Secondary | ICD-10-CM | POA: Diagnosis not present

## 2020-12-26 DIAGNOSIS — Z862 Personal history of diseases of the blood and blood-forming organs and certain disorders involving the immune mechanism: Secondary | ICD-10-CM | POA: Diagnosis not present

## 2020-12-26 DIAGNOSIS — Z885 Allergy status to narcotic agent status: Secondary | ICD-10-CM | POA: Diagnosis not present

## 2020-12-26 DIAGNOSIS — D509 Iron deficiency anemia, unspecified: Secondary | ICD-10-CM | POA: Diagnosis not present

## 2020-12-26 DIAGNOSIS — I10 Essential (primary) hypertension: Secondary | ICD-10-CM | POA: Diagnosis not present

## 2020-12-26 DIAGNOSIS — E119 Type 2 diabetes mellitus without complications: Secondary | ICD-10-CM | POA: Diagnosis not present

## 2020-12-26 DIAGNOSIS — D508 Other iron deficiency anemias: Secondary | ICD-10-CM | POA: Diagnosis not present

## 2020-12-26 DIAGNOSIS — D649 Anemia, unspecified: Secondary | ICD-10-CM | POA: Diagnosis not present

## 2020-12-26 DIAGNOSIS — D501 Sideropenic dysphagia: Secondary | ICD-10-CM | POA: Diagnosis not present

## 2020-12-26 DIAGNOSIS — R634 Abnormal weight loss: Secondary | ICD-10-CM | POA: Diagnosis not present

## 2021-01-02 DIAGNOSIS — Z79631 Long term (current) use of antimetabolite agent: Secondary | ICD-10-CM | POA: Diagnosis not present

## 2021-01-02 DIAGNOSIS — K760 Fatty (change of) liver, not elsewhere classified: Secondary | ICD-10-CM | POA: Diagnosis not present

## 2021-01-02 DIAGNOSIS — N1831 Chronic kidney disease, stage 3a: Secondary | ICD-10-CM | POA: Diagnosis not present

## 2021-01-02 DIAGNOSIS — D500 Iron deficiency anemia secondary to blood loss (chronic): Secondary | ICD-10-CM | POA: Diagnosis not present

## 2021-01-17 DIAGNOSIS — Z23 Encounter for immunization: Secondary | ICD-10-CM | POA: Diagnosis not present

## 2021-01-22 DIAGNOSIS — D573 Sickle-cell trait: Secondary | ICD-10-CM | POA: Diagnosis not present

## 2021-01-22 DIAGNOSIS — D500 Iron deficiency anemia secondary to blood loss (chronic): Secondary | ICD-10-CM | POA: Diagnosis not present

## 2021-01-22 DIAGNOSIS — N1832 Chronic kidney disease, stage 3b: Secondary | ICD-10-CM | POA: Diagnosis not present

## 2021-01-22 DIAGNOSIS — D619 Aplastic anemia, unspecified: Secondary | ICD-10-CM | POA: Diagnosis not present

## 2021-01-22 DIAGNOSIS — D509 Iron deficiency anemia, unspecified: Secondary | ICD-10-CM | POA: Diagnosis not present

## 2021-01-22 DIAGNOSIS — D599 Acquired hemolytic anemia, unspecified: Secondary | ICD-10-CM | POA: Diagnosis not present

## 2021-01-23 NOTE — Progress Notes (Signed)
Please enter orders for surgery scheduled for 02-09-21

## 2021-01-28 DIAGNOSIS — Z23 Encounter for immunization: Secondary | ICD-10-CM | POA: Diagnosis not present

## 2021-01-28 DIAGNOSIS — Z0000 Encounter for general adult medical examination without abnormal findings: Secondary | ICD-10-CM | POA: Diagnosis not present

## 2021-01-28 DIAGNOSIS — I10 Essential (primary) hypertension: Secondary | ICD-10-CM | POA: Diagnosis not present

## 2021-01-28 DIAGNOSIS — D649 Anemia, unspecified: Secondary | ICD-10-CM | POA: Diagnosis not present

## 2021-01-28 DIAGNOSIS — Z20828 Contact with and (suspected) exposure to other viral communicable diseases: Secondary | ICD-10-CM | POA: Diagnosis not present

## 2021-01-28 DIAGNOSIS — Z1331 Encounter for screening for depression: Secondary | ICD-10-CM | POA: Diagnosis not present

## 2021-01-28 DIAGNOSIS — H8113 Benign paroxysmal vertigo, bilateral: Secondary | ICD-10-CM | POA: Diagnosis not present

## 2021-01-28 DIAGNOSIS — D508 Other iron deficiency anemias: Secondary | ICD-10-CM | POA: Diagnosis not present

## 2021-01-28 DIAGNOSIS — M25551 Pain in right hip: Secondary | ICD-10-CM | POA: Diagnosis not present

## 2021-01-28 DIAGNOSIS — E1121 Type 2 diabetes mellitus with diabetic nephropathy: Secondary | ICD-10-CM | POA: Diagnosis not present

## 2021-01-28 DIAGNOSIS — N1831 Chronic kidney disease, stage 3a: Secondary | ICD-10-CM | POA: Diagnosis not present

## 2021-01-30 NOTE — Progress Notes (Addendum)
COVID swab appointment: 02/05/21  COVID Vaccine Completed: yes x1 Date COVID Vaccine completed: 04/20/19 Has received booster: COVID vaccine manufacturer: [employer]   Date of COVID positive in last 90 days: no  PCP - Jettie Booze, MD Cardiologist - Rozann Lesches, MD  Cardiac clearance 09/25/20 by Dr. Domenic Polite in [place].  Chest x-ray - CT 07/03/20 Care EKG - 06/25/20 Epic Stress Test - 11/11/15 Epic ECHO - 08/25/15 Epic Cardiac Cath - 08/23/19 Epic Pacemaker/ICD device last checked: n/a Spinal Cord Stimulator: n/a  Sleep Study - yes positive CPAP - not using after weight loss  Fasting Blood Sugar - 95-120 Checks Blood Sugar _1_ times a day  Blood Thinner Instructions: Aspirin Instructions: ASA 81, no longer taking Last Dose:  Activity level: Can perform activities of daily living without stopping and without symptoms of chest pain or shortness of breath. No stairs due to hip pain.    Anesthesia review: CAD, HTN, STEMI, DM 2, CKD, OSA, TIA, anemia, sickle cell trait  Patient denies shortness of breath, fever, cough and chest pain at PAT appointment   Patient verbalized understanding of instructions that were given to them at the PAT appointment. Patient was also instructed that they will need to review over the PAT instructions again at home before surgery.

## 2021-01-30 NOTE — Patient Instructions (Addendum)
DUE TO COVID-19 ONLY ONE VISITOR IS ALLOWED TO COME WITH YOU AND STAY IN THE WAITING ROOM ONLY DURING PRE OP AND PROCEDURE.   **NO VISITORS ARE ALLOWED IN THE SHORT STAY AREA OR RECOVERY ROOM!!**  IF YOU WILL BE ADMITTED INTO THE HOSPITAL YOU ARE ALLOWED ONLY TWO SUPPORT PEOPLE DURING VISITATION HOURS ONLY (10AM -8PM)   The support person(s) may change daily. The support person(s) must pass our screening, gel in and out, and wear a mask at all times, including in the patient's room. Patients must also wear a mask when staff or their support person are in the room.  No visitors under the age of 5. Any visitor under the age of 54 must be accompanied by an adult.    COVID SWAB TESTING MUST BE COMPLETED ON:  02/05/21 **MUST PRESENT COMPLETED FORM AT TESTING SITE**    [facility] [place] (backside of the building) Open 8am-3pm. No appointment needed. You are not required to quarantine, however you are required to wear a well-fitted mask when you are out and around people not in your household.  Hand Hygiene often Do NOT share personal items Notify your provider if you are in close contact with someone who has COVID or you develop fever 100.4 or greater, new onset of sneezing, cough, sore throat, shortness of breath or body aches.       Your procedure is scheduled on: 02/09/21   Report to [facility] Main Entrance    Report to admitting at 1:15 PM   Call this number if you have problems the morning of surgery [phone]   Do not eat food :After Midnight.   May have liquids until 1:00 PM day of surgery  CLEAR LIQUID DIET  Foods Allowed                                                                     Foods Excluded  Water, Black Coffee and tea (no milk or creamer)           liquids that you cannot  Plain Jell-O in any flavor  (No red)                                    see through such as: Fruit ices (not with fruit pulp)                                             milk, soups, orange juice              Iced Popsicles (No red)                                                All solid food  Apple juices Sports drinks like Gatorade (No red) Lightly seasoned clear broth or consume(fat free) Sugar     The day of surgery:  Drink ONE (1) Pre-Surgery G2 by 1:00 pm the morning of surgery. Drink in one sitting. Do not sip.  This drink was given to you during your hospital  pre-op appointment visit. Nothing else to drink after completing the  Pre-Surgery G2.          If you have questions, please contact your surgeon's office.     Oral Hygiene is also important to reduce your risk of infection.                                    Remember - BRUSH YOUR TEETH THE MORNING OF SURGERY WITH YOUR REGULAR TOOTHPASTE    Take these medicines the morning of surgery with A SIP OF WATER: Tylneol, Zyrtec, Dexilant, Metoprolol, Zofran  DO NOT TAKE ANY ORAL DIABETIC MEDICATIONS DAY OF YOUR SURGERY  How to Manage Your Diabetes Before and After Surgery  Why is it important to control my blood sugar before and after surgery? Improving blood sugar levels before and after surgery helps healing and can limit problems. A way of improving blood sugar control is eating a healthy diet by:  Eating less sugar and carbohydrates  Increasing activity/exercise  Talking with your doctor about reaching your blood sugar goals High blood sugars (greater than 180 mg/dL) can raise your risk of infections and slow your recovery, so you will need to focus on controlling your diabetes during the weeks before surgery. Make sure that the doctor who takes care of your diabetes knows about your planned surgery including the date and location.  How do I manage my blood sugar before surgery? Check your blood sugar at least 4 times a day, starting 2 days before surgery, to make sure that the level is not too high or low. Check your blood sugar the morning  of your surgery when you wake up and every 2 hours until you get to the Short Stay unit. If your blood sugar is less than 70 mg/dL, you will need to treat for low blood sugar: Do not take insulin. Treat a low blood sugar (less than 70 mg/dL) with  cup of clear juice (cranberry or apple), 4 glucose tablets, OR glucose gel. Recheck blood sugar in 15 minutes after treatment (to make sure it is greater than 70 mg/dL). If your blood sugar is not greater than 70 mg/dL on recheck, call [phone] for further instructions. Report your blood sugar to the short stay nurse when you get to Short Stay.  If you are admitted to the hospital after surgery: Your blood sugar will be checked by the staff and you will probably be given insulin after surgery (instead of oral diabetes medicines) to make sure you have good blood sugar levels. The goal for blood sugar control after surgery is 80-180 mg/dL.                              You may not have any metal on your body including hair pins, jewelry, and body piercing             Do not wear make-up, lotions, powders, perfumes, or deodorant  Do not wear nail polish including gel and S&S, artificial/acrylic nails, or any other type of covering  on natural nails including finger and toenails. If you have artificial nails, gel coating, etc. that needs to be removed by a nail salon please have this removed prior to surgery or surgery may need to be canceled/ delayed if the surgeon/ anesthesia feels like they are unable to be safely monitored.   Do not shave  48 hours prior to surgery.    Do not bring valuables to the hospital. [place].   Contacts, dentures or bridgework may not be worn into surgery.   Bring small overnight bag day of surgery.   Special Instructions: Bring a copy of your healthcare power of attorney and living will documents         the day of surgery if you haven't scanned them before.  Please  read over the following fact sheets you were given: IF YOU HAVE QUESTIONS ABOUT YOUR PRE-OP INSTRUCTIONS PLEASE CALL [facility] - Preparing for Surgery Before surgery, you can play an important role.  Because skin is not sterile, your skin needs to be as free of germs as possible.  You can reduce the number of germs on your skin by washing with CHG (chlorahexidine gluconate) soap before surgery.  CHG is an antiseptic cleaner which kills germs and bonds with the skin to continue killing germs even after washing. Please DO NOT use if you have an allergy to CHG or antibacterial soaps.  If your skin becomes reddened/irritated stop using the CHG and inform your nurse when you arrive at Short Stay. Do not shave (including legs and underarms) for at least 48 hours prior to the first CHG shower.  You may shave your face/neck.  Please follow these instructions carefully:  1.  Shower with CHG Soap the night before surgery and the  morning of surgery.  2.  If you choose to wash your hair, wash your hair first as usual with your normal  shampoo.  3.  After you shampoo, rinse your hair and body thoroughly to remove the shampoo.                             4.  Use CHG as you would any other liquid soap.  You can apply chg directly to the skin and wash.  Gently with a scrungie or clean washcloth.  5.  Apply the CHG Soap to your body ONLY FROM THE NECK DOWN.   Do   not use on face/ open                           Wound or open sores. Avoid contact with eyes, ears mouth and   genitals (private parts).                       Wash face,  Genitals (private parts) with your normal soap.             6.  Wash thoroughly, paying special attention to the area where your    surgery  will be performed.  7.  Thoroughly rinse your body with warm water from the neck down.  8.  DO NOT shower/wash with your normal soap after using and rinsing off the CHG Soap.  9.  Pat yourself dry with a clean  towel.            10.  Wear clean pajamas.            11.  Place clean sheets on your bed the night of your first shower and do not  sleep with pets. Day of Surgery : Do not apply any lotions/deodorants the morning of surgery.  Please wear clean clothes to the hospital/surgery center.  FAILURE TO FOLLOW THESE INSTRUCTIONS MAY RESULT IN THE CANCELLATION OF YOUR SURGERY  PATIENT SIGNATURE_________________________________  NURSE SIGNATURE__________________________________  ________________________________________________________________________   Adam Phenix  An incentive spirometer is a tool that can help keep your lungs clear and active. This tool measures how well you are filling your lungs with each breath. Taking long deep breaths may help reverse or decrease the chance of developing breathing (pulmonary) problems (especially infection) following: A long period of time when you are unable to move or be active. BEFORE THE PROCEDURE  If the spirometer includes an indicator to show your best effort, your nurse or respiratory therapist will set it to a desired goal. If possible, sit up straight or lean slightly forward. Try not to slouch. Hold the incentive spirometer in an upright position. INSTRUCTIONS FOR USE  Sit on the edge of your bed if possible, or sit up as far as you can in bed or on a chair. Hold the incentive spirometer in an upright position. Breathe out normally. Place the mouthpiece in your mouth and seal your lips tightly around it. Breathe in slowly and as deeply as possible, raising the piston or the ball toward the top of the column. Hold your breath for 3-5 seconds or for as long as possible. Allow the piston or ball to fall to the bottom of the column. Remove the mouthpiece from your mouth and breathe out normally. Rest for a few seconds and repeat Steps 1 through 7 at least 10 times every 1-2 hours when you are awake. Take your time and take a few normal  breaths between deep breaths. The spirometer may include an indicator to show your best effort. Use the indicator as a goal to work toward during each repetition. After each set of 10 deep breaths, practice coughing to be sure your lungs are clear. If you have an incision (the cut made at the time of surgery), support your incision when coughing by placing a pillow or rolled up towels firmly against it. Once you are able to get out of bed, walk around indoors and cough well. You may stop using the incentive spirometer when instructed by your caregiver.  RISKS AND COMPLICATIONS Take your time so you do not get dizzy or light-headed. If you are in pain, you may need to take or ask for pain medication before doing incentive spirometry. It is harder to take a deep breath if you are having pain. AFTER USE Rest and breathe slowly and easily. It can be helpful to keep track of a log of your progress. Your caregiver can provide you with a simple table to help with this. If you are using the spirometer at home, follow these instructions: [place] IF:  You are having difficultly using the spirometer. You have trouble using the spirometer as often as instructed. Your pain medication is not giving enough relief while using the spirometer. You develop fever of 100.5 F (38.1 C) or higher. SEEK IMMEDIATE MEDICAL CARE IF:  You cough up bloody sputum that  had not been present before. You develop fever of 102 F (38.9 C) or greater. You develop worsening pain at or near the incision site. MAKE SURE YOU:  Understand these instructions. Will watch your condition. Will get help right away if you are not doing well or get worse. Document Released: 07/19/2006 Document Revised: 05/31/2011 Document Reviewed: 09/19/2006 Exit[employer] Patient Information 2014 [employer], [place].   ________________________________________________________________________  WHAT IS A BLOOD TRANSFUSION? Blood Transfusion  Information  A transfusion is the replacement of blood or some of its parts. Blood is made up of multiple cells which provide different functions. Red blood cells carry oxygen and are used for blood loss replacement. White blood cells fight against infection. Platelets control bleeding. Plasma helps clot blood. Other blood products are available for specialized needs, such as hemophilia or other clotting disorders. BEFORE THE TRANSFUSION  Who gives blood for transfusions?  Healthy volunteers who are fully evaluated to make sure their blood is safe. This is blood bank blood. Transfusion therapy is the safest it has ever been in the practice of medicine. Before blood is taken from a donor, a complete history is taken to make sure that person has no history of diseases nor engages in risky social behavior (examples are intravenous drug use or sexual activity with multiple partners). The donor's travel history is screened to minimize risk of transmitting infections, such as malaria. The donated blood is tested for signs of infectious diseases, such as HIV and hepatitis. The blood is then tested to be sure it is compatible with you in order to minimize the chance of a transfusion reaction. If you or a relative donates blood, this is often done in anticipation of surgery and is not appropriate for emergency situations. It takes many days to process the donated blood. RISKS AND COMPLICATIONS Although transfusion therapy is very safe and saves many lives, the main dangers of transfusion include:  Getting an infectious disease. Developing a transfusion reaction. This is an allergic reaction to something in the blood you were given. Every precaution is taken to prevent this. The decision to have a blood transfusion has been considered carefully by your caregiver before blood is given. Blood is not given unless the benefits outweigh the risks. AFTER THE TRANSFUSION Right after receiving a blood transfusion,  you will usually feel much better and more energetic. This is especially true if your red blood cells have gotten low (anemic). The transfusion raises the level of the red blood cells which carry oxygen, and this usually causes an energy increase. The nurse administering the transfusion will monitor you carefully for complications. HOME CARE INSTRUCTIONS  No special instructions are needed after a transfusion. You may find your energy is better. Speak with your caregiver about any limitations on activity for underlying diseases you may have. SEEK MEDICAL CARE IF:  Your condition is not improving after your transfusion. You develop redness or irritation at the intravenous (IV) site. SEEK IMMEDIATE MEDICAL CARE IF:  Any of the following symptoms occur over the next 12 hours: Shaking chills. You have a temperature by mouth above 102 F (38.9 C), not controlled by medicine. Chest, back, or muscle pain. People around you feel you are not acting correctly or are confused. Shortness of breath or difficulty breathing. Dizziness and fainting. You get a rash or develop hives. You have a decrease in urine output. Your urine turns a dark color or changes to pink, red, or brown. Any of the following symptoms occur over the next 10  days: You have a temperature by mouth above 102 F (38.9 C), not controlled by medicine. Shortness of breath. Weakness after normal activity. The white part of the eye turns yellow (jaundice). You have a decrease in the amount of urine or are urinating less often. Your urine turns a dark color or changes to pink, red, or brown. Document Released: 03/05/2000 Document Revised: 05/31/2011 Document Reviewed: 10/23/2007 [facility] Patient Information 2014 [place], [place].  _______________________________________________________________________

## 2021-02-02 ENCOUNTER — Other Ambulatory Visit: Payer: Self-pay

## 2021-02-02 ENCOUNTER — Encounter (HOSPITAL_COMMUNITY): Payer: Self-pay

## 2021-02-02 ENCOUNTER — Encounter (HOSPITAL_COMMUNITY)
Admission: RE | Admit: 2021-02-02 | Discharge: 2021-02-02 | Disposition: A | Discharge status: Home / Self Care | Payer: Medicare Other | Source: Ambulatory Visit | Attending: Orthopedic Surgery | Admitting: Orthopedic Surgery

## 2021-02-02 DIAGNOSIS — Z01812 Encounter for preprocedural laboratory examination: Secondary | ICD-10-CM | POA: Insufficient documentation

## 2021-02-02 DIAGNOSIS — I2510 Atherosclerotic heart disease of native coronary artery without angina pectoris: Secondary | ICD-10-CM | POA: Diagnosis not present

## 2021-02-02 DIAGNOSIS — Z8673 Personal history of transient ischemic attack (TIA), and cerebral infarction without residual deficits: Secondary | ICD-10-CM | POA: Diagnosis not present

## 2021-02-02 DIAGNOSIS — I129 Hypertensive chronic kidney disease with stage 1 through stage 4 chronic kidney disease, or unspecified chronic kidney disease: Secondary | ICD-10-CM | POA: Insufficient documentation

## 2021-02-02 DIAGNOSIS — N183 Chronic kidney disease, stage 3 unspecified: Secondary | ICD-10-CM | POA: Insufficient documentation

## 2021-02-02 DIAGNOSIS — E1122 Type 2 diabetes mellitus with diabetic chronic kidney disease: Secondary | ICD-10-CM | POA: Insufficient documentation

## 2021-02-02 DIAGNOSIS — G4730 Sleep apnea, unspecified: Secondary | ICD-10-CM | POA: Diagnosis not present

## 2021-02-02 DIAGNOSIS — M1611 Unilateral primary osteoarthritis, right hip: Secondary | ICD-10-CM | POA: Diagnosis not present

## 2021-02-02 LAB — COMPREHENSIVE METABOLIC PANEL
ALT: 29 U/L (ref 0–44)
AST: 42 U/L — ABNORMAL HIGH (ref 15–41)
Albumin: 3.4 g/dL — ABNORMAL LOW (ref 3.5–5.0)
Alkaline Phosphatase: 97 U/L (ref 38–126)
Anion gap: 10 (ref 5–15)
BUN: 29 mg/dL — ABNORMAL HIGH (ref 8–23)
CO2: 21 mmol/L — ABNORMAL LOW (ref 22–32)
Calcium: 9.5 mg/dL (ref 8.9–10.3)
Chloride: 105 mmol/L (ref 98–111)
Creatinine, Ser: 1.23 mg/dL — ABNORMAL HIGH (ref 0.44–1.00)
GFR, Estimated: 46 mL/min — ABNORMAL LOW (ref >60)
Glucose, Bld: 96 mg/dL (ref 70–99)
Potassium: 4.2 mmol/L (ref 3.5–5.1)
Sodium: 136 mmol/L (ref 135–145)
Total Bilirubin: 0.9 mg/dL (ref 0.3–1.2)
Total Protein: 7.5 g/dL (ref 6.5–8.1)

## 2021-02-02 LAB — CBC
HCT: 31.2 % — ABNORMAL LOW (ref 36.0–46.0)
Hemoglobin: 10.6 g/dL — ABNORMAL LOW (ref 12.0–15.0)
MCH: 34.5 pg — ABNORMAL HIGH (ref 26.0–34.0)
MCHC: 34 g/dL (ref 30.0–36.0)
MCV: 101.6 fL — ABNORMAL HIGH (ref 80.0–100.0)
Platelets: 196 10*3/uL (ref 150–400)
RBC: 3.07 MIL/uL — ABNORMAL LOW (ref 3.87–5.11)
RDW: 15.3 % (ref 11.5–15.5)
WBC: 4.9 10*3/uL (ref 4.0–10.5)
nRBC: 0 % (ref 0.0–0.2)

## 2021-02-02 LAB — HEMOGLOBIN A1C
Hgb A1c MFr Bld: 4.3 % — ABNORMAL LOW (ref 4.8–5.6)
Mean Plasma Glucose: 76.71 mg/dL

## 2021-02-02 LAB — PROTIME-INR
INR: 1.2 (ref 0.8–1.2)
Prothrombin Time: 15 seconds (ref 11.4–15.2)

## 2021-02-02 LAB — GLUCOSE, CAPILLARY: Glucose-Capillary: 106 mg/dL — ABNORMAL HIGH (ref 70–99)

## 2021-02-02 LAB — SURGICAL PCR SCREEN
MRSA, PCR: NEGATIVE
Staphylococcus aureus: NEGATIVE

## 2021-02-03 NOTE — Progress Notes (Signed)
Anesthesia Chart Review   Case: [id number] Date/Time: 02/09/21 1545   Procedure: TOTAL HIP ARTHROPLASTY ANTERIOR APPROACH (Right: Hip)   Anesthesia type: Choice   Pre-op diagnosis: Right hip osteoarthritis   Location: [facility] ROOM 09 / [facility]   Surgeons: Gaynelle Arabian, MD       DISCUSSION:75 y.o. former smoker with h/o HTN, CAD (DES), CKD, DM II, sleep apnea, right hip OA scheduled for above procedure 02/09/2021 with Dr. Gaynelle Arabian.   Per cardiology preoperative evaluation 09/25/2020, "Chart reviewed as part of pre-operative protocol coverage. Patient was contacted 09/25/2020 in reference to pre-operative risk assessment for pending surgery as outlined below.  Ely Spragg was last seen on 06/25/20 by Dr. Domenic Polite.     She has history of CAD with last coronary intervention DES to the Lcx in 2017 and showed moderate residual LAD dz, medically managed at this point. Follow-up Myoview did not indicate anterior distribution ischemia. In addition to CAD, she has history of CKD, HTN, HLD, OSA, CVA/TIA, and DM2.   At last OV, she was cleared for R total hip surgery by Dr. Domenic Polite at intermediate risk but no further cardiac testing was felt needed at that time.    I reached out to patient for update on how she is doing. The patient affirms she has been doing well without any new cardiac symptoms.  Therefore, based on ACC/AHA guidelines, the patient would be at acceptable risk for the planned procedure without further cardiovascular testing. "  Anticipate pt can proceed with planned procedure barring acute status change.   VS: BP (!) 143/66   Pulse 73   Temp 36.7 C (Oral)   Resp 18   Ht 5\' 4"  (1.626 m)   Wt 75.5 kg   SpO2 99%   BMI 28.57 kg/m   PROVIDERS: Neale Burly, MD is PCP   Rozann Lesches, MD is Cardiologist  LABS: Labs reviewed: Acceptable for surgery. (all labs ordered are listed, but only abnormal results are displayed)  Labs Reviewed  HEMOGLOBIN A1C - Abnormal;  Notable for the following components:      Result Value   Hgb A1c MFr Bld 4.3 (*)    All other components within normal limits  CBC - Abnormal; Notable for the following components:   RBC 3.07 (*)    Hemoglobin 10.6 (*)    HCT 31.2 (*)    MCV 101.6 (*)    MCH 34.5 (*)    All other components within normal limits  COMPREHENSIVE METABOLIC PANEL - Abnormal; Notable for the following components:   CO2 21 (*)    BUN 29 (*)    Creatinine, Ser 1.23 (*)    Albumin 3.4 (*)    AST 42 (*)    GFR, Estimated 46 (*)    All other components within normal limits  GLUCOSE, CAPILLARY - Abnormal; Notable for the following components:   Glucose-Capillary 106 (*)    All other components within normal limits  SURGICAL PCR SCREEN  PROTIME-INR  TYPE AND SCREEN     IMAGES:   EKG: 06/25/2020 Rate 62 bpm  NSR  CV: Echo 08/25/2015 Study Conclusions   - Left ventricle: The cavity size was normal. Wall thickness was    increased in a pattern of mild LVH. Systolic function was normal.    The estimated ejection fraction was in the range of 60% to 65%.    Wall motion was normal; there were no regional wall motion    abnormalities. Doppler parameters are  consistent with abnormal    left ventricular relaxation (grade 1 diastolic dysfunction).  - Mitral valve: Mildly calcified annulus.  Past Medical History:  Diagnosis Date   Arthritis    CAD (coronary artery disease)    DES to circumflex June 2017, moderate residual LAD disease   CKD (chronic kidney disease) stage 3, GFR 30-59 ml/min (HCC)    Essential hypertension    Mixed hyperlipidemia    Muscle cramps at night    NSTEMI (non-ST elevated myocardial infarction) ([place]) 2016   Probable type II event, negative Cardiolite 2012   Sleep apnea    Stroke ([place])    tia   TIA (transient ischemic attack)    Type 2 diabetes mellitus ([place])     Past Surgical History:  Procedure Laterality Date   CARDIAC CATHETERIZATION N/A 08/23/2015   Procedure: Left  Heart Cath and Coronary Angiography;  Surgeon: Leonie Man, MD;  Location: [place] [facility];  Service: Cardiovascular;  Laterality: N/A;   CARDIAC CATHETERIZATION N/A 08/23/2015   Procedure: Coronary Stent Intervention;  Surgeon: Leonie Man, MD;  Location: [place] [facility];  Service: Cardiovascular;  Laterality: N/A;   CHOLECYSTECTOMY     EYE SURGERY  1990s   GIVENS CAPSULE STUDY N/A 04/01/2020   Procedure: GIVENS CAPSULE STUDY;  Surgeon: Harvel Quale, MD;  [facility];  Service: Gastroenterology;  Laterality: N/A;  7:30   KNEE ARTHROPLASTY Bilateral    Tubal pregnancy removal      MEDICATIONS:  acetaminophen (TYLENOL) 650 MG CR tablet   aspirin-sod bicarb-citric acid (ALKA-SELTZER) 325 MG TBEF tablet   atorvastatin (LIPITOR) 80 MG tablet   Calcium Carbonate-Vitamin D (OSCAL 500/200 D-3 PO)   Carboxymethylcellul-Glycerin (LUBRICATING EYE DROPS OP)   cetirizine (ZYRTEC) 10 MG tablet   Choline Fenofibrate 135 MG capsule   clotrimazole-betamethasone (LOTRISONE) cream   Dexlansoprazole (DEXILANT) 30 MG capsule   diclofenac Sodium (VOLTAREN) 1 % GEL   econazole nitrate 1 % cream   folic acid (FOLVITE) 1 MG tablet   furosemide (LASIX) 20 MG tablet   meclizine (ANTIVERT) 25 MG tablet   metaxalone (SKELAXIN) 800 MG tablet   methotrexate (RHEUMATREX) 2.5 MG tablet   metoprolol tartrate (LOPRESSOR) 50 MG tablet   montelukast (SINGULAIR) 10 MG tablet   nitroGLYCERIN (NITROSTAT) 0.4 MG SL tablet   Omega-3 Fatty Acids (FISH OIL) 1000 MG CAPS   ondansetron (ZOFRAN-ODT) 4 MG disintegrating tablet   Polysaccharide Iron Complex (POLY-IRON 150 PO)   potassium chloride SA (K-DUR,KLOR-CON) 20 MEQ tablet   valACYclovir (VALTREX) 500 MG tablet   No current facility-administered medications for this encounter.    Konrad Felix Ward, PA-C [facility] [phone]

## 2021-02-03 NOTE — Anesthesia Preprocedure Evaluation (Addendum)
Anesthesia Evaluation  Patient identified by MRN, date of birth, ID band Patient awake    Reviewed: Allergy & Precautions, NPO status , Patient's Chart, lab work & pertinent test results  Airway Mallampati: II  TM Distance: >3 FB Neck ROM: Full    Dental   Pulmonary sleep apnea , former smoker,    Pulmonary exam normal        Cardiovascular hypertension, Pt. on medications + CAD and + Cardiac Stents  Normal cardiovascular exam     Neuro/Psych TIACVA    GI/Hepatic negative GI ROS, Neg liver ROS,   Endo/Other  diabetes, Type 2  Renal/GU CRFRenal disease     Musculoskeletal  (+) Arthritis ,   Abdominal   Peds  Hematology  (+) anemia ,   Anesthesia Other Findings   Reproductive/Obstetrics                            Anesthesia Physical Anesthesia Plan  ASA: 3  Anesthesia Plan: Spinal   Post-op Pain Management:    Induction:   PONV Risk Score and Plan: 2 and Propofol infusion and Ondansetron  Airway Management Planned: Natural Airway and Simple Face Mask  Additional Equipment: None  Intra-op Plan:   Post-operative Plan:   Informed Consent: I have reviewed the patients History and Physical, chart, labs and discussed the procedure including the risks, benefits and alternatives for the proposed anesthesia with the patient or authorized representative who has indicated his/her understanding and acceptance.       Plan Discussed with: CRNA  Anesthesia Plan Comments:        Anesthesia Quick Evaluation

## 2021-02-05 ENCOUNTER — Other Ambulatory Visit: Payer: Self-pay | Admitting: Orthopedic Surgery

## 2021-02-05 DIAGNOSIS — N1832 Chronic kidney disease, stage 3b: Secondary | ICD-10-CM | POA: Diagnosis not present

## 2021-02-05 DIAGNOSIS — D509 Iron deficiency anemia, unspecified: Secondary | ICD-10-CM | POA: Diagnosis not present

## 2021-02-05 DIAGNOSIS — D573 Sickle-cell trait: Secondary | ICD-10-CM | POA: Diagnosis not present

## 2021-02-05 DIAGNOSIS — D619 Aplastic anemia, unspecified: Secondary | ICD-10-CM | POA: Diagnosis not present

## 2021-02-05 LAB — SARS CORONAVIRUS 2 (TAT 6-24 HRS): SARS Coronavirus 2: NEGATIVE

## 2021-02-09 ENCOUNTER — Ambulatory Visit (HOSPITAL_COMMUNITY): Payer: Medicare Other | Admitting: Anesthesiology

## 2021-02-09 ENCOUNTER — Encounter (HOSPITAL_COMMUNITY)
Admission: RE | Disposition: A | Discharge status: Home / Self Care | Payer: Self-pay | Source: Home / Self Care | Attending: Orthopedic Surgery

## 2021-02-09 ENCOUNTER — Encounter (HOSPITAL_COMMUNITY): Payer: Self-pay | Admitting: Orthopedic Surgery

## 2021-02-09 ENCOUNTER — Inpatient Hospital Stay (HOSPITAL_COMMUNITY)
Admission: RE | Admit: 2021-02-09 | Discharge: 2021-02-11 | DRG: 470 | Disposition: A | Discharge status: Home / Self Care | Payer: Medicare Other | Attending: Orthopedic Surgery | Admitting: Orthopedic Surgery

## 2021-02-09 ENCOUNTER — Ambulatory Visit (HOSPITAL_COMMUNITY): Payer: Medicare Other

## 2021-02-09 ENCOUNTER — Ambulatory Visit (HOSPITAL_COMMUNITY): Payer: Medicare Other | Admitting: Physician Assistant

## 2021-02-09 ENCOUNTER — Observation Stay (HOSPITAL_COMMUNITY): Payer: Medicare Other

## 2021-02-09 DIAGNOSIS — N183 Chronic kidney disease, stage 3 unspecified: Secondary | ICD-10-CM | POA: Diagnosis not present

## 2021-02-09 DIAGNOSIS — I2510 Atherosclerotic heart disease of native coronary artery without angina pectoris: Secondary | ICD-10-CM | POA: Diagnosis not present

## 2021-02-09 DIAGNOSIS — D631 Anemia in chronic kidney disease: Secondary | ICD-10-CM | POA: Diagnosis not present

## 2021-02-09 DIAGNOSIS — M1611 Unilateral primary osteoarthritis, right hip: Secondary | ICD-10-CM | POA: Diagnosis not present

## 2021-02-09 DIAGNOSIS — E1122 Type 2 diabetes mellitus with diabetic chronic kidney disease: Secondary | ICD-10-CM | POA: Diagnosis present

## 2021-02-09 DIAGNOSIS — M25751 Osteophyte, right hip: Secondary | ICD-10-CM | POA: Diagnosis not present

## 2021-02-09 DIAGNOSIS — Z96653 Presence of artificial knee joint, bilateral: Secondary | ICD-10-CM | POA: Diagnosis present

## 2021-02-09 DIAGNOSIS — E782 Mixed hyperlipidemia: Secondary | ICD-10-CM | POA: Diagnosis present

## 2021-02-09 DIAGNOSIS — I252 Old myocardial infarction: Secondary | ICD-10-CM | POA: Diagnosis not present

## 2021-02-09 DIAGNOSIS — Z955 Presence of coronary angioplasty implant and graft: Secondary | ICD-10-CM

## 2021-02-09 DIAGNOSIS — Z8673 Personal history of transient ischemic attack (TIA), and cerebral infarction without residual deficits: Secondary | ICD-10-CM

## 2021-02-09 DIAGNOSIS — Z96641 Presence of right artificial hip joint: Secondary | ICD-10-CM | POA: Diagnosis not present

## 2021-02-09 DIAGNOSIS — I129 Hypertensive chronic kidney disease with stage 1 through stage 4 chronic kidney disease, or unspecified chronic kidney disease: Secondary | ICD-10-CM | POA: Diagnosis present

## 2021-02-09 DIAGNOSIS — Z79899 Other long term (current) drug therapy: Secondary | ICD-10-CM

## 2021-02-09 DIAGNOSIS — Z8249 Family history of ischemic heart disease and other diseases of the circulatory system: Secondary | ICD-10-CM | POA: Diagnosis not present

## 2021-02-09 DIAGNOSIS — Z96649 Presence of unspecified artificial hip joint: Secondary | ICD-10-CM

## 2021-02-09 DIAGNOSIS — Z471 Aftercare following joint replacement surgery: Secondary | ICD-10-CM | POA: Diagnosis not present

## 2021-02-09 DIAGNOSIS — M169 Osteoarthritis of hip, unspecified: Secondary | ICD-10-CM | POA: Diagnosis present

## 2021-02-09 DIAGNOSIS — Z87891 Personal history of nicotine dependence: Secondary | ICD-10-CM

## 2021-02-09 DIAGNOSIS — Z833 Family history of diabetes mellitus: Secondary | ICD-10-CM

## 2021-02-09 DIAGNOSIS — Z885 Allergy status to narcotic agent status: Secondary | ICD-10-CM

## 2021-02-09 DIAGNOSIS — G4730 Sleep apnea, unspecified: Secondary | ICD-10-CM | POA: Diagnosis present

## 2021-02-09 DIAGNOSIS — Z9049 Acquired absence of other specified parts of digestive tract: Secondary | ICD-10-CM

## 2021-02-09 HISTORY — PX: TOTAL HIP ARTHROPLASTY: SHX124

## 2021-02-09 LAB — GLUCOSE, CAPILLARY: Glucose-Capillary: 90 mg/dL (ref 70–99)

## 2021-02-09 LAB — TYPE AND SCREEN
ABO/RH(D): A POS
Antibody Screen: NEGATIVE

## 2021-02-09 SURGERY — ARTHROPLASTY, HIP, TOTAL, ANTERIOR APPROACH
Anesthesia: Spinal | Site: Hip | Laterality: Right

## 2021-02-09 MED ORDER — FENTANYL CITRATE PF 50 MCG/ML IJ SOSY: 25.0000 ug | PREFILLED_SYRINGE | INTRAMUSCULAR | Status: DC | PRN

## 2021-02-09 MED ORDER — METOCLOPRAMIDE HCL 5 MG/ML IJ SOLN: 5.0000 mg | Freq: Three times a day (TID) | INTRAMUSCULAR | Status: DC | PRN

## 2021-02-09 MED ORDER — FENTANYL CITRATE (PF) 100 MCG/2ML IJ SOLN
INTRAMUSCULAR | Status: DC | PRN
  Administered 2021-02-09: 50 ug via INTRAVENOUS

## 2021-02-09 MED ORDER — METHOCARBAMOL 500 MG PO TABS
500.0000 mg | ORAL_TABLET | Freq: Four times a day (QID) | ORAL | Status: DC | PRN
  Administered 2021-02-09: 500 mg via ORAL
  Filled 2021-02-09: qty 1

## 2021-02-09 MED ORDER — LACTATED RINGERS IV SOLN: INTRAVENOUS | Status: DC

## 2021-02-09 MED ORDER — DEXAMETHASONE SODIUM PHOSPHATE 10 MG/ML IJ SOLN
10.0000 mg | Freq: Once | INTRAMUSCULAR | Status: AC
  Administered 2021-02-10: 10 mg via INTRAVENOUS
  Filled 2021-02-09: qty 1

## 2021-02-09 MED ORDER — FUROSEMIDE 20 MG PO TABS
20.0000 mg | ORAL_TABLET | Freq: Every day | ORAL | Status: DC
  Administered 2021-02-10 – 2021-02-11 (×2): 20 mg via ORAL
  Filled 2021-02-09 (×2): qty 1

## 2021-02-09 MED ORDER — PHENYLEPHRINE 40 MCG/ML (10ML) SYRINGE FOR IV PUSH (FOR BLOOD PRESSURE SUPPORT)
PREFILLED_SYRINGE | INTRAVENOUS | Status: DC | PRN
  Administered 2021-02-09: 80 ug via INTRAVENOUS
  Administered 2021-02-09: 120 ug via INTRAVENOUS

## 2021-02-09 MED ORDER — CHLORHEXIDINE GLUCONATE 0.12 % MT SOLN
15.0000 mL | Freq: Once | OROMUCOSAL | Status: AC
  Administered 2021-02-09: 15 mL via OROMUCOSAL

## 2021-02-09 MED ORDER — POVIDONE-IODINE 10 % EX SWAB
2.0000 "application " | Freq: Once | CUTANEOUS | Status: AC
  Administered 2021-02-09: 2 via TOPICAL

## 2021-02-09 MED ORDER — METHOCARBAMOL 500 MG IVPB - SIMPLE MED
500.0000 mg | Freq: Four times a day (QID) | INTRAVENOUS | Status: DC | PRN
  Filled 2021-02-09: qty 50

## 2021-02-09 MED ORDER — FOLIC ACID 1 MG PO TABS
1.0000 mg | ORAL_TABLET | ORAL | Status: DC
  Administered 2021-02-10 – 2021-02-11 (×2): 1 mg via ORAL
  Filled 2021-02-09 (×2): qty 1

## 2021-02-09 MED ORDER — WATER FOR IRRIGATION, STERILE IR SOLN
Status: DC | PRN
  Administered 2021-02-09: 2000 mL

## 2021-02-09 MED ORDER — BUPIVACAINE HCL 0.25 % IJ SOLN
INTRAMUSCULAR | Status: DC | PRN
  Administered 2021-02-09: 30 mL

## 2021-02-09 MED ORDER — PANTOPRAZOLE SODIUM 40 MG PO TBEC
40.0000 mg | DELAYED_RELEASE_TABLET | Freq: Every day | ORAL | Status: DC
  Administered 2021-02-10 – 2021-02-11 (×2): 40 mg via ORAL
  Filled 2021-02-09 (×2): qty 1

## 2021-02-09 MED ORDER — BUPIVACAINE IN DEXTROSE 0.75-8.25 % IT SOLN
INTRATHECAL | Status: DC | PRN
  Administered 2021-02-09: 1.8 mL via INTRATHECAL

## 2021-02-09 MED ORDER — BISACODYL 10 MG RE SUPP: 10.0000 mg | Freq: Every day | RECTAL | Status: DC | PRN

## 2021-02-09 MED ORDER — ALBUMIN HUMAN 5 % IV SOLN: INTRAVENOUS | Status: DC | PRN

## 2021-02-09 MED ORDER — BUPIVACAINE HCL (PF) 0.25 % IJ SOLN
INTRAMUSCULAR | Status: AC
  Filled 2021-02-09: qty 30

## 2021-02-09 MED ORDER — CEFAZOLIN SODIUM-DEXTROSE 2-4 GM/100ML-% IV SOLN
2.0000 g | INTRAVENOUS | Status: AC
  Administered 2021-02-09: 2 g via INTRAVENOUS
  Filled 2021-02-09: qty 100

## 2021-02-09 MED ORDER — PROPOFOL 10 MG/ML IV BOLUS
INTRAVENOUS | Status: DC | PRN
  Administered 2021-02-09 (×2): 20 mg via INTRAVENOUS
  Administered 2021-02-09: 10 mg via INTRAVENOUS

## 2021-02-09 MED ORDER — PROPOFOL 1000 MG/100ML IV EMUL
INTRAVENOUS | Status: AC
  Filled 2021-02-09: qty 100

## 2021-02-09 MED ORDER — DOCUSATE SODIUM 100 MG PO CAPS
100.0000 mg | ORAL_CAPSULE | Freq: Two times a day (BID) | ORAL | Status: DC
  Administered 2021-02-09 – 2021-02-11 (×4): 100 mg via ORAL
  Filled 2021-02-09 (×4): qty 1

## 2021-02-09 MED ORDER — RIVAROXABAN 10 MG PO TABS
10.0000 mg | ORAL_TABLET | Freq: Every day | ORAL | Status: DC
  Administered 2021-02-10 – 2021-02-11 (×2): 10 mg via ORAL
  Filled 2021-02-09 (×2): qty 1

## 2021-02-09 MED ORDER — PHENOL 1.4 % MT LIQD: 1.0000 | OROMUCOSAL | Status: DC | PRN

## 2021-02-09 MED ORDER — TRANEXAMIC ACID-NACL 1000-0.7 MG/100ML-% IV SOLN
1000.0000 mg | INTRAVENOUS | Status: AC
  Administered 2021-02-09: 1000 mg via INTRAVENOUS
  Filled 2021-02-09: qty 100

## 2021-02-09 MED ORDER — ONDANSETRON HCL 4 MG PO TABS: 4.0000 mg | ORAL_TABLET | Freq: Four times a day (QID) | ORAL | Status: DC | PRN

## 2021-02-09 MED ORDER — CEFAZOLIN SODIUM-DEXTROSE 2-4 GM/100ML-% IV SOLN
2.0000 g | Freq: Four times a day (QID) | INTRAVENOUS | Status: AC
  Administered 2021-02-09 – 2021-02-10 (×2): 2 g via INTRAVENOUS
  Filled 2021-02-09 (×2): qty 100

## 2021-02-09 MED ORDER — HYDROCODONE-ACETAMINOPHEN 5-325 MG PO TABS
1.0000 | ORAL_TABLET | ORAL | Status: DC | PRN
  Administered 2021-02-10 – 2021-02-11 (×8): 2 via ORAL
  Filled 2021-02-09 (×9): qty 2

## 2021-02-09 MED ORDER — ACETAMINOPHEN 10 MG/ML IV SOLN
1000.0000 mg | Freq: Four times a day (QID) | INTRAVENOUS | Status: DC
  Administered 2021-02-09: 1000 mg via INTRAVENOUS
  Filled 2021-02-09: qty 100

## 2021-02-09 MED ORDER — PROPOFOL 500 MG/50ML IV EMUL
INTRAVENOUS | Status: AC
  Filled 2021-02-09: qty 50

## 2021-02-09 MED ORDER — ONDANSETRON HCL 4 MG/2ML IJ SOLN
INTRAMUSCULAR | Status: DC | PRN
  Administered 2021-02-09: 4 mg via INTRAVENOUS

## 2021-02-09 MED ORDER — 0.9 % SODIUM CHLORIDE (POUR BTL) OPTIME
TOPICAL | Status: DC | PRN
  Administered 2021-02-09: 1000 mL

## 2021-02-09 MED ORDER — METOCLOPRAMIDE HCL 5 MG PO TABS: 5.0000 mg | ORAL_TABLET | Freq: Three times a day (TID) | ORAL | Status: DC | PRN

## 2021-02-09 MED ORDER — MENTHOL 3 MG MT LOZG: 1.0000 | LOZENGE | OROMUCOSAL | Status: DC | PRN

## 2021-02-09 MED ORDER — FENTANYL CITRATE (PF) 100 MCG/2ML IJ SOLN
INTRAMUSCULAR | Status: AC
  Filled 2021-02-09: qty 2

## 2021-02-09 MED ORDER — ACETAMINOPHEN 325 MG PO TABS: 325.0000 mg | ORAL_TABLET | Freq: Four times a day (QID) | ORAL | Status: DC | PRN

## 2021-02-09 MED ORDER — POLYETHYLENE GLYCOL 3350 17 G PO PACK: 17.0000 g | PACK | Freq: Every day | ORAL | Status: DC | PRN

## 2021-02-09 MED ORDER — ATORVASTATIN CALCIUM 40 MG PO TABS
80.0000 mg | ORAL_TABLET | Freq: Every day | ORAL | Status: DC
  Administered 2021-02-10: 80 mg via ORAL
  Filled 2021-02-09: qty 2

## 2021-02-09 MED ORDER — POTASSIUM CHLORIDE CRYS ER 20 MEQ PO TBCR
20.0000 meq | EXTENDED_RELEASE_TABLET | Freq: Every day | ORAL | Status: DC
  Administered 2021-02-10 – 2021-02-11 (×2): 20 meq via ORAL
  Filled 2021-02-09 (×2): qty 1

## 2021-02-09 MED ORDER — DEXAMETHASONE SODIUM PHOSPHATE 10 MG/ML IJ SOLN
8.0000 mg | Freq: Once | INTRAMUSCULAR | Status: AC
  Administered 2021-02-09: 8 mg via INTRAVENOUS

## 2021-02-09 MED ORDER — AMISULPRIDE (ANTIEMETIC) 5 MG/2ML IV SOLN: 10.0000 mg | Freq: Once | INTRAVENOUS | Status: DC | PRN

## 2021-02-09 MED ORDER — POLYSACCHARIDE IRON COMPLEX 150 MG PO CAPS
150.0000 mg | ORAL_CAPSULE | Freq: Every day | ORAL | Status: DC
  Administered 2021-02-09 – 2021-02-10 (×2): 150 mg via ORAL
  Filled 2021-02-09 (×2): qty 1

## 2021-02-09 MED ORDER — PROPOFOL 500 MG/50ML IV EMUL
INTRAVENOUS | Status: DC | PRN
  Administered 2021-02-09: 75 ug/kg/min via INTRAVENOUS

## 2021-02-09 MED ORDER — ONDANSETRON HCL 4 MG/2ML IJ SOLN
4.0000 mg | Freq: Four times a day (QID) | INTRAMUSCULAR | Status: DC | PRN
  Administered 2021-02-10: 4 mg via INTRAVENOUS
  Filled 2021-02-09: qty 2

## 2021-02-09 MED ORDER — ORAL CARE MOUTH RINSE: 15.0000 mL | Freq: Once | OROMUCOSAL | Status: AC

## 2021-02-09 MED ORDER — TRAMADOL HCL 50 MG PO TABS
50.0000 mg | ORAL_TABLET | Freq: Four times a day (QID) | ORAL | Status: DC | PRN
  Administered 2021-02-09: 100 mg via ORAL
  Filled 2021-02-09: qty 2

## 2021-02-09 MED ORDER — VALACYCLOVIR HCL 500 MG PO TABS
500.0000 mg | ORAL_TABLET | Freq: Every day | ORAL | Status: DC
  Administered 2021-02-11: 500 mg via ORAL
  Filled 2021-02-09 (×2): qty 1

## 2021-02-09 MED ORDER — METOPROLOL TARTRATE 25 MG PO TABS
25.0000 mg | ORAL_TABLET | Freq: Every day | ORAL | Status: DC
  Administered 2021-02-10 – 2021-02-11 (×2): 25 mg via ORAL
  Filled 2021-02-09 (×2): qty 1

## 2021-02-09 MED ORDER — SODIUM CHLORIDE 0.9 % IV SOLN: INTRAVENOUS | Status: DC

## 2021-02-09 MED ORDER — MORPHINE SULFATE (PF) 2 MG/ML IV SOLN: 0.5000 mg | INTRAVENOUS | Status: DC | PRN

## 2021-02-09 SURGICAL SUPPLY — 44 items
BAG COUNTER SPONGE SURGICOUNT (BAG) IMPLANT
BAG DECANTER FOR FLEXI CONT (MISCELLANEOUS) IMPLANT
BAG ZIPLOCK 12X15 (MISCELLANEOUS) IMPLANT
BALL HIP ARTICU 28 +5 (Hips) ×1 IMPLANT
BLADE SAG 18X100X1.27 (BLADE) ×2 IMPLANT
COVER PERINEAL POST (MISCELLANEOUS) ×2 IMPLANT
COVER SURGICAL LIGHT HANDLE (MISCELLANEOUS) ×2 IMPLANT
CUP ACETBLR 48 OD SECTOR II (Hips) ×2 IMPLANT
DECANTER SPIKE VIAL GLASS SM (MISCELLANEOUS) ×2 IMPLANT
DRAPE FOOT SWITCH (DRAPES) ×2 IMPLANT
DRAPE STERI IOBAN 125X83 (DRAPES) ×2 IMPLANT
DRAPE U-SHAPE 47X51 STRL (DRAPES) ×4 IMPLANT
DRESSING AQUACEL AG SP 3.5X10 (GAUZE/BANDAGES/DRESSINGS) ×1 IMPLANT
DRSG AQUACEL AG ADV 3.5X10 (GAUZE/BANDAGES/DRESSINGS) ×2 IMPLANT
DRSG AQUACEL AG SP 3.5X10 (GAUZE/BANDAGES/DRESSINGS) ×2
DURAPREP 26ML APPLICATOR (WOUND CARE) ×2 IMPLANT
ELECT REM PT RETURN 15FT ADLT (MISCELLANEOUS) ×2 IMPLANT
GLOVE SRG 8 PF TXTR STRL LF DI (GLOVE) ×1 IMPLANT
GLOVE SURG ENC MOIS LTX SZ6.5 (GLOVE) ×2 IMPLANT
GLOVE SURG ENC MOIS LTX SZ7 (GLOVE) ×2 IMPLANT
GLOVE SURG ENC MOIS LTX SZ8 (GLOVE) ×4 IMPLANT
GLOVE SURG UNDER POLY LF SZ7 (GLOVE) ×2 IMPLANT
GLOVE SURG UNDER POLY LF SZ8 (GLOVE) ×1
GLOVE SURG UNDER POLY LF SZ8.5 (GLOVE) IMPLANT
GOWN STRL REUS W/TWL LRG LVL3 (GOWN DISPOSABLE) ×4 IMPLANT
GOWN STRL REUS W/TWL XL LVL3 (GOWN DISPOSABLE) IMPLANT
HIP BALL ARTICU 28 +5 (Hips) ×2 IMPLANT
HOLDER FOLEY CATH W/STRAP (MISCELLANEOUS) ×2 IMPLANT
KIT TURNOVER KIT A (KITS) IMPLANT
LINER ACET ALTRX NEUT +4X28X48 (Hips) ×2 IMPLANT
MANIFOLD NEPTUNE II (INSTRUMENTS) ×2 IMPLANT
PACK ANTERIOR HIP CUSTOM (KITS) ×2 IMPLANT
PENCIL SMOKE EVACUATOR COATED (MISCELLANEOUS) ×2 IMPLANT
STEM FEMORAL SZ 6MM STD ACTIS (Stem) ×2 IMPLANT
STRIP CLOSURE SKIN 1/2X4 (GAUZE/BANDAGES/DRESSINGS) ×4 IMPLANT
SUT ETHIBOND NAB CT1 #1 30IN (SUTURE) ×2 IMPLANT
SUT MNCRL AB 4-0 PS2 18 (SUTURE) ×2 IMPLANT
SUT STRATAFIX 0 PDS 27 VIOLET (SUTURE) ×2
SUT VIC AB 2-0 CT1 27 (SUTURE) ×2
SUT VIC AB 2-0 CT1 TAPERPNT 27 (SUTURE) ×2 IMPLANT
SUTURE STRATFX 0 PDS 27 VIOLET (SUTURE) ×1 IMPLANT
SYR 50ML LL SCALE MARK (SYRINGE) IMPLANT
TRAY FOLEY MTR SLVR 16FR STAT (SET/KITS/TRAYS/PACK) ×2 IMPLANT
TUBE SUCTION HIGH CAP CLEAR NV (SUCTIONS) ×2 IMPLANT

## 2021-02-09 NOTE — Discharge Instructions (Addendum)
Frank Aluisio, MD Total Joint Specialist [facility] [address] [place], [place] [address] [phone]  ANTERIOR APPROACH TOTAL HIP REPLACEMENT POSTOPERATIVE DIRECTIONS     Hip Rehabilitation, Guidelines Following Surgery  The results of a hip operation are greatly improved after range of motion and muscle strengthening exercises. Follow all safety measures which are given to protect your hip. If any of these exercises cause increased pain or swelling in your joint, decrease the amount until you are comfortable again. Then slowly increase the exercises. Call your caregiver if you have problems or questions.   BLOOD CLOT PREVENTION Take a 10 mg Xarelto once a day for three weeks following surgery. Then take an 81 mg Aspirin once a day for three weeks. Then discontinue Aspirin. You may resume your vitamins/supplements once you have discontinued the Xarelto. Do not take any NSAIDs (Advil, Aleve, Ibuprofen, Meloxicam, etc.) until you have discontinued the Xarelto.   HOME CARE INSTRUCTIONS  Remove items at home which could result in a fall. This includes throw rugs or furniture in walking pathways.  ICE to the affected hip as frequently as 20-30 minutes an hour and then as needed for pain and swelling. Continue to use ice on the hip for pain and swelling from surgery. You may notice swelling that will progress down to the foot and ankle. This is normal after surgery. Elevate the leg when you are not up walking on it.   Continue to use the breathing machine which will help keep your temperature down.  It is common for your temperature to cycle up and down following surgery, especially at night when you are not up moving around and exerting yourself.  The breathing machine keeps your lungs expanded and your temperature down.  DIET You may resume your previous home diet once your are discharged from the hospital.  DRESSING / WOUND CARE / SHOWERING You have an  adhesive waterproof bandage over the incision. Leave this in place until your first follow-up appointment. Once you remove this you will not need to place another bandage.  You may begin showering 3 days following surgery, but do not submerge the incision under water.  ACTIVITY For the first 3-5 days, it is important to rest and keep the operative leg elevated. You should, as a general rule, rest for 50 minutes and walk/stretch for 10 minutes per hour. After 5 days, you may slowly increase activity as tolerated.  Perform the exercises you were provided twice a day for about 15-20 minutes each session. Begin these 2 days following surgery. Walk with your walker as instructed. Use the walker until you are comfortable transitioning to a cane. Walk with the cane in the opposite hand of the operative leg. You may discontinue the cane once you are comfortable and walking steadily. Avoid periods of inactivity such as sitting longer than an hour when not asleep. This helps prevent blood clots.  Do not drive a car for 6 weeks or until released by your surgeon.  Do not drive while taking narcotics.  TED HOSE STOCKINGS Wear the elastic stockings on both legs for three weeks following surgery during the day. You may remove them at night while sleeping.  WEIGHT BEARING Weight bearing as tolerated with assist device (walker, cane, etc) as directed, use it as long as suggested by your surgeon or therapist, typically at least 4-6 weeks.  POSTOPERATIVE CONSTIPATION PROTOCOL Constipation - defined medically as fewer than three stools per week and severe constipation as less   than one stool per week.  One of the most common issues patients have following surgery is constipation.  Even if you have a regular bowel pattern at home, your normal regimen is likely to be disrupted due to multiple reasons following surgery.  Combination of anesthesia, postoperative narcotics, change in appetite and fluid intake all can  affect your bowels.  In order to avoid complications following surgery, here are some recommendations in order to help you during your recovery period.  Colace (docusate) - Pick up an over-the-counter form of Colace or another stool softener and take twice a day as long as you are requiring postoperative pain medications.  Take with a full glass of water daily.  If you experience loose stools or diarrhea, hold the colace until you stool forms back up.  If your symptoms do not get better within 1 week or if they get worse, check with your doctor. Dulcolax (bisacodyl) - Pick up over-the-counter and take as directed by the product packaging as needed to assist with the movement of your bowels.  Take with a full glass of water.  Use this product as needed if not relieved by Colace only.  MiraLax (polyethylene glycol) - Pick up over-the-counter to have on hand.  MiraLax is a solution that will increase the amount of water in your bowels to assist with bowel movements.  Take as directed and can mix with a glass of water, juice, soda, coffee, or tea.  Take if you go more than two days without a movement.Do not use MiraLax more than once per day. Call your doctor if you are still constipated or irregular after using this medication for 7 days in a row.  If you continue to have problems with postoperative constipation, please contact the office for further assistance and recommendations.  If you experience "the worst abdominal pain ever" or develop nausea or vomiting, please contact the office immediatly for further recommendations for treatment.  ITCHING  If you experience itching with your medications, try taking only a single pain pill, or even half a pain pill at a time.  You can also use Benadryl over the counter for itching or also to help with sleep.   MEDICATIONS See your medication summary on the "After Visit Summary" that the nursing staff will review with you prior to discharge.  You may have some home  medications which will be placed on hold until you complete the course of blood thinner medication.  It is important for you to complete the blood thinner medication as prescribed by your surgeon.  Continue your approved medications as instructed at time of discharge.  PRECAUTIONS If you experience chest pain or shortness of breath - call 911 immediately for transfer to the hospital emergency department.  If you develop a fever greater that 101 F, purulent drainage from wound, increased redness or drainage from wound, foul odor from the wound/dressing, or calf pain - CONTACT YOUR SURGEON.                                                   FOLLOW-UP APPOINTMENTS Make sure you keep all of your appointments after your operation with your surgeon and caregivers. You should call the office at the above phone number and make an appointment for approximately two weeks after the date of your surgery or on the date  instructed by your surgeon outlined in the "After Visit Summary".  RANGE OF MOTION AND STRENGTHENING EXERCISES  These exercises are designed to help you keep full movement of your hip joint. Follow your caregiver's or physical therapist's instructions. Perform all exercises about fifteen times, three times per day or as directed. Exercise both hips, even if you have had only one joint replacement. These exercises can be done on a training (exercise) mat, on the floor, on a table or on a bed. Use whatever works the best and is most comfortable for you. Use music or television while you are exercising so that the exercises are a pleasant break in your day. This will make your life better with the exercises acting as a break in routine you can look forward to.  Lying on your back, slowly slide your foot toward your buttocks, raising your knee up off the floor. Then slowly slide your foot back down until your leg is straight again.  Lying on your back spread your legs as far apart as you can without causing  discomfort.  Lying on your side, raise your upper leg and foot straight up from the floor as far as is comfortable. Slowly lower the leg and repeat.  Lying on your back, tighten up the muscle in the front of your thigh (quadriceps muscles). You can do this by keeping your leg straight and trying to raise your heel off the floor. This helps strengthen the largest muscle supporting your knee.  Lying on your back, tighten up the muscles of your buttocks both with the legs straight and with the knee bent at a comfortable angle while keeping your heel on the floor.   POST-OPERATIVE OPIOID TAPER INSTRUCTIONS: It is important to wean off of your opioid medication as soon as possible. If you do not need pain medication after your surgery it is ok to stop day one. Opioids include: Codeine, Hydrocodone(Norco, Vicodin), Oxycodone(Percocet, oxycontin) and hydromorphone amongst others.  Long term and even short term use of opiods can cause: Increased pain response Dependence Constipation Depression Respiratory depression And more.  Withdrawal symptoms can include Flu like symptoms Nausea, vomiting And more Techniques to manage these symptoms Hydrate well Eat regular healthy meals Stay active Use relaxation techniques(deep breathing, meditating, yoga) Do Not substitute Alcohol to help with tapering If you have been on opioids for less than two weeks and do not have pain than it is ok to stop all together.  Plan to wean off of opioids This plan should start within one week post op of your joint replacement. Maintain the same interval or time between taking each dose and first decrease the dose.  Cut the total daily intake of opioids by one tablet each day Next start to increase the time between doses. The last dose that should be eliminated is the evening dose.   IF YOU ARE TRANSFERRED TO A SKILLED REHAB FACILITY If the patient is transferred to a skilled rehab facility following release from the  hospital, a list of the current medications will be sent to the facility for the patient to continue.  When discharged from the skilled rehab facility, please have the facility set up the patient's Home Health Physical Therapy prior to being released. Also, the skilled facility will be responsible for providing the patient with their medications at time of release from the facility to include their pain medication, the muscle relaxants, and their blood thinner medication. If the patient is still at the rehab facility at   time of the two week follow up appointment, the skilled rehab facility will also need to assist the patient in arranging follow up appointment in our office and any transportation needs.  MAKE SURE YOU:  Understand these instructions.  Get help right away if you are not doing well or get worse.    DENTAL ANTIBIOTICS:  In most cases prophylactic antibiotics for Dental procdeures after total joint surgery are not necessary.  Exceptions are as follows:  1. History of prior total joint infection  2. Severely immunocompromised (Organ Transplant, cancer chemotherapy, Rheumatoid biologic meds such as Humera)  3. Poorly controlled diabetes (A1C &gt; 8.0, blood glucose over 200)  If you have one of these conditions, contact your surgeon for an antibiotic prescription, prior to your dental procedure.    Pick up stool softner and laxative for home use following surgery while on pain medications. Do not submerge incision under water. Please use good hand washing techniques while changing dressing each day. May shower starting three days after surgery. Please use a clean towel to pat the incision dry following showers. Continue to use ice for pain and swelling after surgery. Do not use any lotions or creams on the incision until instructed by your surgeon.      Information on my medicine - XARELTO (Rivaroxaban)  Why was Xarelto prescribed for you? Xarelto was prescribed  for you to reduce the risk of blood clots forming after orthopedic surgery. The medical term for these abnormal blood clots is venous thromboembolism (VTE).  What do you need to know about xarelto ? Take your Xarelto ONCE DAILY at the same time every day. You may take it either with or without food.  If you have difficulty swallowing the tablet whole, you may crush it and mix in applesauce just prior to taking your dose.  Take Xarelto exactly as prescribed by your doctor and DO NOT stop taking Xarelto without talking to the doctor who prescribed the medication.  Stopping without other VTE prevention medication to take the place of Xarelto may increase your risk of developing a clot.  After discharge, you should have regular check-up appointments with your healthcare provider that is prescribing your Xarelto.    What do you do if you miss a dose? If you miss a dose, take it as soon as you remember on the same day then continue your regularly scheduled once daily regimen the next day. Do not take two doses of Xarelto on the same day.   Important Safety Information A possible side effect of Xarelto is bleeding. You should call your healthcare provider right away if you experience any of the following: Bleeding from an injury or your nose that does not stop. Unusual colored urine (red or dark brown) or unusual colored stools (red or black). Unusual bruising for unknown reasons. A serious fall or if you hit your head (even if there is no bleeding).  Some medicines may interact with Xarelto and might increase your risk of bleeding while on Xarelto. To help avoid this, consult your healthcare provider or pharmacist prior to using any new prescription or non-prescription medications, including herbals, vitamins, non-steroidal anti-inflammatory drugs (NSAIDs) and supplements.  This website has more information on Xarelto: www.xarelto.com.   

## 2021-02-09 NOTE — Anesthesia Procedure Notes (Signed)
Spinal  Patient location during procedure: OR Start time: 02/09/2021 3:32 PM End time: 02/09/2021 3:39 PM Staffing Performed: resident/CRNA  Anesthesiologist: Suzette Battiest, MD Resident/CRNA: Gwyndolyn Saxon, CRNA Preanesthetic Checklist Completed: patient identified, IV checked, site marked, risks and benefits discussed, surgical consent, monitors and equipment checked, pre-op evaluation and timeout performed Spinal Block Patient position: sitting Prep: DuraPrep Patient monitoring: heart rate, continuous pulse ox and blood pressure Approach: midline Location: L3-4 Injection technique: single-shot Needle Needle type: Pencan  Needle gauge: 24 G Assessment Sensory level: T6 Events: CSF return

## 2021-02-09 NOTE — Interval H&P Note (Signed)
History and Physical Interval Note:  02/09/2021 12:42 PM  Diana Patterson  has presented today for surgery, with the diagnosis of Right hip osteoarthritis.  The various methods of treatment have been discussed with the patient and family. After consideration of risks, benefits and other options for treatment, the patient has consented to  Procedure(s): TOTAL HIP ARTHROPLASTY ANTERIOR APPROACH (Right) as a surgical intervention.  The patient's history has been reviewed, patient examined, no change in status, stable for surgery.  I have reviewed the patient's chart and labs.  Questions were answered to the patient's satisfaction.     Pilar Plate Moataz Tavis

## 2021-02-09 NOTE — Plan of Care (Signed)
?  Problem: Education: ?Goal: Knowledge of General Education information will improve ?Description: Including pain rating scale, medication(s)/side effects and non-pharmacologic comfort measures ?Outcome: Progressing ?  ?Problem: Activity: ?Goal: Risk for activity intolerance will decrease ?Outcome: Progressing ?  ?Problem: Nutrition: ?Goal: Adequate nutrition will be maintained ?Outcome: Progressing ?  ?Problem: Elimination: ?Goal: Will not experience complications related to bowel motility ?Outcome: Progressing ?  ?Problem: Pain Managment: ?Goal: General experience of comfort will improve ?Outcome: Progressing ?  ?Problem: Education: ?Goal: Knowledge of the prescribed therapeutic regimen will improve ?Outcome: Progressing ?  ?Problem: Activity: ?Goal: Ability to avoid complications of mobility impairment will improve ?Outcome: Progressing ?  ?Problem: Pain Management: ?Goal: Pain level will decrease with appropriate interventions ?Outcome: Progressing ?  ?

## 2021-02-09 NOTE — Transfer of Care (Signed)
Immediate Anesthesia Transfer of Care Note  Patient: Diana Patterson  Procedure(s) Performed: TOTAL HIP ARTHROPLASTY ANTERIOR APPROACH (Right: Hip)  Patient Location: PACU  Anesthesia Type:Spinal  Level of Consciousness: awake  Airway & Oxygen Therapy: Patient Spontanous Breathing and Patient connected to face mask oxygen  Post-op Assessment: Report given to RN and Post -op Vital signs reviewed and stable  Post vital signs: Reviewed and stable  Last Vitals:  Vitals Value Taken Time  BP 117/56 02/09/21 1710  Temp 36.3 C 02/09/21 1710  Pulse 59 02/09/21 1714  Resp 14 02/09/21 1714  SpO2 100 % 02/09/21 1714  Vitals shown include unvalidated device data.  Last Pain:  Vitals:   02/09/21 1710  TempSrc:   PainSc: 0-No pain      Patients Stated Pain Goal: 7 (12/50/87 1994)  Complications: No notable events documented.

## 2021-02-09 NOTE — Anesthesia Postprocedure Evaluation (Signed)
Anesthesia Post Note  Patient: Diana Patterson  Procedure(s) Performed: TOTAL HIP ARTHROPLASTY ANTERIOR APPROACH (Right: Hip)     Patient location during evaluation: PACU Anesthesia Type: Spinal Level of consciousness: awake and alert Pain management: pain level controlled Vital Signs Assessment: post-procedure vital signs reviewed and stable Respiratory status: spontaneous breathing and respiratory function stable Cardiovascular status: blood pressure returned to baseline and stable Postop Assessment: spinal receding Anesthetic complications: no   No notable events documented.  Last Vitals:  Vitals:   02/09/21 1815 02/09/21 1841  BP: (!) 155/68 (!) 146/68  Pulse: 62 (!) 59  Resp: 13 20  Temp: 36.7 C 36.7 C  SpO2: 100% 100%    Last Pain:  Vitals:   02/09/21 1841  TempSrc: Oral  PainSc:                  Tiajuana Amass

## 2021-02-09 NOTE — Progress Notes (Signed)
Orthopedic Tech Progress Note Patient Details:  Shacola Schussler Oct 24, 1945 [id number]      Post Interventions Patient Tolerated: Well  Charline Bills Yulia Ulrich 02/09/2021, 8:56 PM

## 2021-02-09 NOTE — Op Note (Signed)
OPERATIVE REPORT- TOTAL HIP ARTHROPLASTY   PREOPERATIVE DIAGNOSIS: Osteoarthritis of the Right hip.   POSTOPERATIVE DIAGNOSIS: Osteoarthritis of the Right  hip.   PROCEDURE: Right total hip arthroplasty, anterior approach.   SURGEON: Gaynelle Arabian, MD   ASSISTANT: Molli Barrows, PA-C  ANESTHESIA:  Spinal  ESTIMATED BLOOD LOSS:-500 mL    DRAINS: None  COMPLICATIONS: None   CONDITION: PACU - hemodynamically stable.   BRIEF CLINICAL NOTE: Diana Patterson is a 75 y.o. female who has advanced end-  stage arthritis of their Right  hip with progressively worsening pain and  dysfunction.The patient has failed nonoperative management and presents for  total hip arthroplasty.   PROCEDURE IN DETAIL: After successful administration of spinal  anesthetic, the traction boots for the [facility] bed were placed on both  feet and the patient was placed onto the [facility] bed, boots placed into the leg  holders. The Right hip was then isolated from the perineum with plastic  drapes and prepped and draped in the usual sterile fashion. ASIS and  greater trochanter were marked and a oblique incision was made, starting  at about 1 cm lateral and 2 cm distal to the ASIS and coursing towards  the anterior cortex of the femur. The skin was cut with a 10 blade  through subcutaneous tissue to the level of the fascia overlying the  tensor fascia lata muscle. The fascia was then incised in line with the  incision at the junction of the anterior third and posterior 2/3rd. The  muscle was teased off the fascia and then the interval between the TFL  and the rectus was developed. The Hohmann retractor was then placed at  the top of the femoral neck over the capsule. The vessels overlying the  capsule were cauterized and the fat on top of the capsule was removed.  A Hohmann retractor was then placed anterior underneath the rectus  femoris to give exposure to the entire anterior capsule. A T-shaped   capsulotomy was performed. The edges were tagged and the femoral head  was identified.       Osteophytes are removed off the superior acetabulum.  The femoral neck was then cut in situ with an oscillating saw. Traction  was then applied to the left lower extremity utilizing the [employer]  traction. The femoral head was then removed. Retractors were placed  around the acetabulum and then circumferential removal of the labrum was  performed. Osteophytes were also removed. Reaming starts at 45 mm to  medialize and  Increased in 2 mm increments to 47 mm. We reamed in  approximately 40 degrees of abduction, 20 degrees anteversion. A 48 mm  pinnacle acetabular shell was then impacted in anatomic position under  fluoroscopic guidance with excellent purchase. We did not need to place  any additional dome screws. A 28 mm neutral + 4 Ultrex liner was then  placed into the acetabular shell.       The femoral lift was then placed along the lateral aspect of the femur  just distal to the vastus ridge. The leg was  externally rotated and capsule  was stripped off the inferior aspect of the femoral neck down to the  level of the lesser trochanter, this was done with electrocautery. The femur was lifted after this was performed. The  leg was then placed in an extended and adducted position essentially delivering the femur. We also removed the capsule superiorly and the piriformis from the piriformis fossa to  gain excellent exposure of the  proximal femur. Rongeur was used to remove some cancellous bone to get  into the lateral portion of the proximal femur for placement of the  initial starter reamer. The starter broaches was placed  the starter broach  and was shown to go down the center of the canal. Broaching  with the Actis system was then performed starting at size 0  coursing  Up to size 6. A size 6 had excellent torsional and rotational  and axial stability. The trial standard offset neck was then placed   with a 28 + 5 trial head. The hip was then reduced. We confirmed that  the stem was in the canal both on AP and lateral x-rays. It also has excellent sizing. The hip was reduced with outstanding stability through full extension and full external rotation.. AP pelvis was taken and the leg lengths were measured and found to be equal. Hip was then dislocated again and the femoral head and neck removed. The  femoral broach was removed. Size 6 Actis stem with a standard offset  neck was then impacted into the femur following native anteversion. Has  excellent purchase in the canal. Excellent torsional and rotational and  axial stability. It is confirmed to be in the canal on AP and lateral  fluoroscopic views. The 28 + 5 metal head was placed and the hip  reduced with outstanding stability. Again AP pelvis was taken and it  confirmed that the leg lengths were equal. The wound was then copiously  irrigated with saline solution and the capsule reattached and repaired  with Ethibond suture. 30 ml of .25% Bupivicaine was  injected into the capsule and into the edge of the tensor fascia lata as well as subcutaneous tissue. The fascia overlying the tensor fascia lata was then closed with a running #1 V-Loc. Subcu was closed with interrupted 2-0 Vicryl and subcuticular running 4-0 Monocryl. Incision was cleaned  and dried. Steri-Strips and a bulky sterile dressing applied. The patient was awakened and transported to  recovery in stable condition.        Please note that a surgical assistant was a medical necessity for this procedure to perform it in a safe and expeditious manner. Assistant was necessary to provide appropriate retraction of vital neurovascular structures and to prevent femoral fracture and allow for anatomic placement of the prosthesis.  Gaynelle Arabian, M.D.

## 2021-02-09 NOTE — H&P (Signed)
TOTAL HIP ADMISSION H&P  Patient is admitted for right total hip arthroplasty.  Subjective:  Chief Complaint: Right hip pain  HPI: Diana Patterson, 75 y.o. female, has a history of pain and functional disability in the right hip due to arthritis and patient has failed non-surgical conservative treatments for greater than 12 weeks to include NSAID's and/or analgesics, flexibility and strengthening excercises, and activity modification. Onset of symptoms was gradual, starting  several  years ago with gradually worsening course since that time. The patient noted no past surgery on the right hip. Patient currently rates pain in the right hip at 7 out of 10 with activity. Patient has worsening of pain with activity and weight bearing and pain with passive range of motion. Patient has evidence of periarticular osteophytes and joint space narrowing by imaging studies. This condition presents safety issues increasing the risk of falls. There is no current active infection.  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   ST elevation myocardial infarction (STEMI) of inferior wall, initial episode of care ([place]) 08/23/2015   ST elevation myocardial infarction (STEMI) of inferior wall ([place]) 08/23/2015   Preoperative evaluation to rule out surgical contraindication 05/17/2012   CAD (coronary artery disease), native coronary artery 12/18/2010   Benign essential hypertension 12/18/2010   Type 2 diabetes mellitus ([place]) 12/18/2010   Dyslipidemia 12/18/2010   CKD (chronic kidney disease) stage 3, GFR 30-59 ml/min ([place]) 12/18/2010    Past Medical History:  Diagnosis Date   Arthritis    CAD (coronary artery disease)    DES to circumflex June 2017, moderate residual LAD disease   CKD (chronic kidney disease)  stage 3, GFR 30-59 ml/min (HCC)    Essential hypertension    Mixed hyperlipidemia    Muscle cramps at night    NSTEMI (non-ST elevated myocardial infarction) ([place]) 2016   Probable type II event, negative Cardiolite 2012   Sleep apnea    Stroke ([place])    tia   TIA (transient ischemic attack)    Type 2 diabetes mellitus ([place])     Past Surgical History:  Procedure Laterality Date   CARDIAC CATHETERIZATION N/A 08/23/2015   Procedure: Left Heart Cath and Coronary Angiography;  Surgeon: Leonie Man, MD;  Location: [facility];  Service: Cardiovascular;  Laterality: N/A;   CARDIAC CATHETERIZATION N/A 08/23/2015   Procedure: Coronary Stent Intervention;  Surgeon: Leonie Man, MD;  Location: [facility];  Service: Cardiovascular;  Laterality: N/A;   CHOLECYSTECTOMY     EYE SURGERY  1990s   GIVENS CAPSULE STUDY N/A 04/01/2020   Procedure: GIVENS CAPSULE STUDY;  Surgeon: Harvel Quale, MD;  Location: [facility];  Service: Gastroenterology;  Laterality: N/A;  7:30   KNEE ARTHROPLASTY Bilateral    Tubal pregnancy removal      Prior to Admission medications   Medication Sig Start Date End Date Taking? Authorizing Provider  acetaminophen (TYLENOL) 650 MG CR tablet Take 1,950 mg by mouth in the morning and at bedtime.   Yes [provider]  aspirin-sod bicarb-citric acid (ALKA-SELTZER) 325 MG TBEF tablet Take 650 mg by mouth every 6 (six) hours as needed (upset stomach).   Yes [provider]  atorvastatin (LIPITOR) 80 MG tablet Take 1 tablet (80 mg total) by mouth daily  at 6 PM. 08/26/15  Yes Lyda Jester M, PA-C  Calcium Carbonate-Vitamin D (OSCAL 500/200 D-3 PO) Take 1 tablet by mouth daily.   Yes [provider]  Carboxymethylcellul-Glycerin (LUBRICATING EYE DROPS OP) Place 1 drop into both eyes daily as needed (dry eyes).   Yes [provider]  cetirizine (ZYRTEC) 10 MG tablet Take 10 mg by mouth daily as needed for  allergies.   Yes [provider]  Choline Fenofibrate 135 MG capsule Take 135 mg by mouth daily in the afternoon.   Yes [provider]  clotrimazole-betamethasone (LOTRISONE) cream Apply 1 application topically daily.   Yes [provider]  Dexlansoprazole (DEXILANT) 30 MG capsule Take 30 mg by mouth daily with breakfast.   Yes [provider]  diclofenac Sodium (VOLTAREN) 1 % GEL Apply 2 g topically 3 (three) times daily as needed (pain).   Yes [provider]  econazole nitrate 1 % cream Apply 1 application topically daily as needed (irritation).  08/28/19  Yes [provider]  folic acid (FOLVITE) 1 MG tablet Take 1 mg by mouth See admin instructions. Take 1 mg by mouth daily except for Saturday   Yes [provider]  furosemide (LASIX) 20 MG tablet Take 20 mg by mouth daily with breakfast.   Yes [provider]  meclizine (ANTIVERT) 25 MG tablet Take 25 mg by mouth every 6 (six) hours as needed for dizziness. 11/16/20  Yes [provider]  metaxalone (SKELAXIN) 800 MG tablet Take 800 mg by mouth 2 (two) times daily.    Yes [provider]  methotrexate (RHEUMATREX) 2.5 MG tablet Take 20 mg by mouth every Saturday. Take 8 tablets on Saturday for 20mg  dose.   Yes [provider]  metoprolol tartrate (LOPRESSOR) 50 MG tablet Take 25 mg by mouth daily.   Yes [provider]  montelukast (SINGULAIR) 10 MG tablet Take 10 mg by mouth daily in the afternoon.   Yes [provider]  nitroGLYCERIN (NITROSTAT) 0.4 MG SL tablet Place 1 tablet (0.4 mg total) under the tongue every 5 (five) minutes as needed for chest pain. 11/10/16  Yes Satira Sark, MD  Omega-3 Fatty Acids (FISH OIL) 1000 MG CAPS Take 1,000 mg by mouth in the morning and at bedtime.   Yes [provider]  ondansetron (ZOFRAN-ODT) 4 MG disintegrating tablet Take 4 mg by mouth every 4 (four) hours as needed for  nausea/vomiting. 11/02/20  Yes [provider]  Polysaccharide Iron Complex (POLY-IRON 150 PO) Take 150 mg by mouth at bedtime.   Yes [provider]  potassium chloride SA (K-DUR,KLOR-CON) 20 MEQ tablet Take 20 mEq by mouth daily with breakfast.   Yes [provider]  valACYclovir (VALTREX) 500 MG tablet Take 500 mg by mouth daily.   Yes [provider]    Allergies  Allergen Reactions   Morphine And Related Other (See Comments)    Drowsiness    Social History   Socioeconomic History   Marital status: Widowed    Spouse name: Not on file   Number of children: Not on file   Years of education: Not on file   Highest education level: Not on file  Occupational History   Not on file  Tobacco Use   Smoking status: Former    Packs/day: 1.00    Years: 20.00    Pack years: 20.00    Types: Cigarettes    Quit date: 03/22/1978    Years since quitting:  42.9   Smokeless tobacco: Never  Vaping Use   Vaping Use: Never used  Substance and Sexual Activity   Alcohol use: No    Alcohol/week: 0.0 standard drinks   Drug use: No   Sexual activity: Not on file  Other Topics Concern   Not on file  Social History Narrative   Has 5 children   Social Determinants of Health   Financial Resource Strain: Not on file  Food Insecurity: Not on file  Transportation Needs: Not on file  Physical Activity: Not on file  Stress: Not on file  Social Connections: Not on file  Intimate Partner Violence: Not on file    Tobacco Use: Medium Risk   Smoking Tobacco Use: Former   Smokeless Tobacco Use: Never   Passive Exposure: Not on file   Social History   Substance and Sexual Activity  Alcohol Use No   Alcohol/week: 0.0 standard drinks    Family History  Problem Relation Age of Onset   Heart attack Father    Diabetes Sister     ROS: Constitutional: no fever, no chills, no night sweats, no significant weight loss Cardiovascular: no chest pain, no  palpitations Respiratory: no cough, no shortness of breath, No COPD Gastrointestinal: no vomiting, no nausea Musculoskeletal: no swelling in Joints, Joint Pain Neurologic: no numbness, no tingling, no difficulty with balance    Objective:  Physical Exam: Well nourished and well developed.  General: Alert and oriented x3, cooperative and pleasant, no acute distress.  Head: normocephalic, atraumatic, neck supple.  Eyes: EOMI.  Respiratory: breath sounds clear in all fields, no wheezing, rales, or rhonchi. Cardiovascular: Regular rate and rhythm, no murmurs, gallops or rubs.  Abdomen: non-tender to palpation and soft, normoactive bowel sounds. Musculoskeletal:  Ambulating with assistance from a walker, significantly antalgic gait on the right.       Right Hip Exam:  The range of motion: Flexion to 100 degrees, Internal Rotation to 0 degrees, External Rotation to 0 degrees, and abduction to 10 degrees.  There is no tenderness over the greater trochanteric bursa.         Left Hip Exam:  The range of motion: Flexion to 120 degrees, Internal Rotation to 20 degrees, External Rotation to 30 degrees, and abduction to 30 degrees without discomfort.  There is no tenderness over the greater trochanteric bursa.  There is no pain on provocative testing of the hip.      Right Knee Exam:  No effusion present. No swelling present.  The range of motion is: 0 to 125 degrees.  No crepitus on range of motion of the knee.  No medial joint line tenderness.  No lateral joint line tenderness.  The knee is stable.      The patient's sensation and motor function are intact in their lower extremities. Their distal pulses are 2+. The bilateral calves are soft and nontender.        Vital signs in last 24 hours:    Imaging Review Radiographs- AP pelvis, AP and lateral of the right hip dated 11/2018 demonstrate severe bone on bone arthritis in the right hip with large marginal osteophytes  and subchondral sclerosis. I did not feel like we needed to repeat these today. She also has arthritis in the left hip but to a lesser degree.  Assessment/Plan:  End stage arthritis, right hip  The patient history, physical examination, clinical judgement of the provider and imaging studies are consistent with end stage degenerative joint disease of the right  hip and total hip arthroplasty is deemed medically necessary. The treatment options including medical management, injection therapy, arthroscopy and arthroplasty were discussed at length. The risks and benefits of total hip arthroplasty were presented and reviewed. The risks due to aseptic loosening, infection, stiffness, dislocation/subluxation, thromboembolic complications and other imponderables were discussed. The patient acknowledged the explanation, agreed to proceed with the plan and consent was signed. Patient is being admitted for inpatient treatment for surgery, pain control, PT, OT, prophylactic antibiotics, VTE prophylaxis, progressive ambulation and ADLs and discharge planning.The patient is planning to be discharged  home .   Patient's anticipated LOS is less than 2 midnights, meeting these requirements: - Lives within 1 hour of care - Has a competent adult at home to recover with post-op - NO history of  - Chronic pain requiring opioids  - Diabetes  - Coronary Artery Disease  - Heart failure  - Heart attack  - Stroke  - DVT/VTE  - Cardiac arrhythmia  - Respiratory Failure/COPD  - Renal failure  - Anemia  - Advanced Liver disease    Therapy Plans: HEP Disposition: Home with daughter Planned DVT Prophylaxis: Xarelto 10mg  DME Needed: [facility] PCP: Stoney Bang, MD (clearance received) TXA: IV Allergies: Morphine (blacks out) Anesthesia Concerns: None BMI: 29.7 Last HgbA1c: N/A  Pharmacy: [facility]  Other:  - Hemoglobin 10.6 at [place] on 02/02/2021 - Hemoglobin 11.1 at [place] office on  01/30/2021  - Patient was instructed on what medications to stop prior to surgery. - Follow-up visit in 2 weeks with Dr. Wynelle Link - Begin physical therapy following surgery - Pre-operative lab work as pre-surgical testing - Prescriptions will be provided in hospital at time of discharge  Fenton Foy, [facility], PA-C Orthopedic Surgery [facility] [place]

## 2021-02-09 NOTE — Care Plan (Signed)
Ortho Bundle Case Management Note  Patient Details  Name: Taleah Bellantoni MRN: [id number] Date of Birth: 1945/08/15  R THA on 02-09-21 DCP:  Home with dtr.   DME:  RW and 3-in-1 ordered through [facility] PT:  HEP                   DME Arranged:  Walker rolling, 3-N-1 DME Agency:  NA  HH Arranged:  NA HH Agency:  NA  Additional Comments: Please contact me with any questions of if this plan should need to change.  Marianne Sofia, RN,CCM [facility]  [phone] 02/09/2021, 5:06 PM

## 2021-02-10 ENCOUNTER — Other Ambulatory Visit: Payer: Self-pay

## 2021-02-10 ENCOUNTER — Encounter (HOSPITAL_COMMUNITY): Payer: Self-pay | Admitting: Orthopedic Surgery

## 2021-02-10 DIAGNOSIS — E782 Mixed hyperlipidemia: Secondary | ICD-10-CM | POA: Diagnosis present

## 2021-02-10 DIAGNOSIS — M1611 Unilateral primary osteoarthritis, right hip: Secondary | ICD-10-CM | POA: Diagnosis present

## 2021-02-10 DIAGNOSIS — I2510 Atherosclerotic heart disease of native coronary artery without angina pectoris: Secondary | ICD-10-CM | POA: Diagnosis present

## 2021-02-10 DIAGNOSIS — E1122 Type 2 diabetes mellitus with diabetic chronic kidney disease: Secondary | ICD-10-CM | POA: Diagnosis present

## 2021-02-10 DIAGNOSIS — I252 Old myocardial infarction: Secondary | ICD-10-CM | POA: Diagnosis not present

## 2021-02-10 DIAGNOSIS — Z79899 Other long term (current) drug therapy: Secondary | ICD-10-CM | POA: Diagnosis not present

## 2021-02-10 DIAGNOSIS — I129 Hypertensive chronic kidney disease with stage 1 through stage 4 chronic kidney disease, or unspecified chronic kidney disease: Secondary | ICD-10-CM | POA: Diagnosis present

## 2021-02-10 DIAGNOSIS — Z9049 Acquired absence of other specified parts of digestive tract: Secondary | ICD-10-CM | POA: Diagnosis not present

## 2021-02-10 DIAGNOSIS — Z8249 Family history of ischemic heart disease and other diseases of the circulatory system: Secondary | ICD-10-CM | POA: Diagnosis not present

## 2021-02-10 DIAGNOSIS — M25751 Osteophyte, right hip: Secondary | ICD-10-CM | POA: Diagnosis present

## 2021-02-10 DIAGNOSIS — Z96653 Presence of artificial knee joint, bilateral: Secondary | ICD-10-CM | POA: Diagnosis present

## 2021-02-10 DIAGNOSIS — Z8673 Personal history of transient ischemic attack (TIA), and cerebral infarction without residual deficits: Secondary | ICD-10-CM | POA: Diagnosis not present

## 2021-02-10 DIAGNOSIS — Z87891 Personal history of nicotine dependence: Secondary | ICD-10-CM | POA: Diagnosis not present

## 2021-02-10 DIAGNOSIS — D631 Anemia in chronic kidney disease: Secondary | ICD-10-CM | POA: Diagnosis present

## 2021-02-10 DIAGNOSIS — G4730 Sleep apnea, unspecified: Secondary | ICD-10-CM | POA: Diagnosis present

## 2021-02-10 DIAGNOSIS — Z955 Presence of coronary angioplasty implant and graft: Secondary | ICD-10-CM | POA: Diagnosis not present

## 2021-02-10 DIAGNOSIS — N183 Chronic kidney disease, stage 3 unspecified: Secondary | ICD-10-CM | POA: Diagnosis present

## 2021-02-10 DIAGNOSIS — Z833 Family history of diabetes mellitus: Secondary | ICD-10-CM | POA: Diagnosis not present

## 2021-02-10 DIAGNOSIS — Z885 Allergy status to narcotic agent status: Secondary | ICD-10-CM | POA: Diagnosis not present

## 2021-02-10 LAB — CBC
HCT: 22.9 % — ABNORMAL LOW (ref 36.0–46.0)
Hemoglobin: 7.8 g/dL — ABNORMAL LOW (ref 12.0–15.0)
MCH: 34.8 pg — ABNORMAL HIGH (ref 26.0–34.0)
MCHC: 34.1 g/dL (ref 30.0–36.0)
MCV: 102.2 fL — ABNORMAL HIGH (ref 80.0–100.0)
Platelets: 146 10*3/uL — ABNORMAL LOW (ref 150–400)
RBC: 2.24 MIL/uL — ABNORMAL LOW (ref 3.87–5.11)
RDW: 14.9 % (ref 11.5–15.5)
WBC: 8.3 10*3/uL (ref 4.0–10.5)
nRBC: 0 % (ref 0.0–0.2)

## 2021-02-10 LAB — BASIC METABOLIC PANEL
Anion gap: 5 (ref 5–15)
BUN: 21 mg/dL (ref 8–23)
CO2: 23 mmol/L (ref 22–32)
Calcium: 9 mg/dL (ref 8.9–10.3)
Chloride: 110 mmol/L (ref 98–111)
Creatinine, Ser: 1.05 mg/dL — ABNORMAL HIGH (ref 0.44–1.00)
GFR, Estimated: 55 mL/min — ABNORMAL LOW (ref >60)
Glucose, Bld: 140 mg/dL — ABNORMAL HIGH (ref 70–99)
Potassium: 4.6 mmol/L (ref 3.5–5.1)
Sodium: 138 mmol/L (ref 135–145)

## 2021-02-10 NOTE — Progress Notes (Signed)
Physical Therapy Treatment Patient Details Name: Diana Patterson MRN: [id number] DOB: 1946-01-11 Today's Date: 02/10/2021   History of Present Illness 75 yo female s/p R THA. PMH: chronic anemia, STEMI, CAD, TIA    PT Comments    Pt progressing well this pm, incr amb distance/tolerance. No dizziness with mobility   Recommendations for follow up therapy are one component of a multi-disciplinary discharge planning process, led by the attending physician.  Recommendations may be updated based on patient status, additional functional criteria and insurance authorization.  Follow Up Recommendations  Follow physician's recommendations for discharge plan and follow up therapies     Assistance Recommended at Discharge Frequent or constant Supervision/Assistance  Equipment Recommendations  Rolling walker (2 wheels);BSC/3in1    Recommendations for Other Services       Precautions / Restrictions Precautions Precautions: Fall Restrictions Weight Bearing Restrictions: No Other Position/Activity Restrictions: WBAT     Mobility  Bed Mobility Overal bed mobility: Needs Assistance Bed Mobility: Supine to Sit     Supine to sit: Min assist     General bed mobility comments: assist with RLE, educated on use of gait belt to self assist    Transfers Overall transfer level: Needs assistance Equipment used: Rolling walker (2 wheels) Transfers: Sit to/from Stand Sit to Stand: Min assist           General transfer comment: cues for hand placement, assist for anterior-superior wt shift and on transition to RW reliant on momentum to stand.    Ambulation/Gait Ambulation/Gait assistance: Min guard Gait Distance (Feet): 80 Feet Assistive device: Rolling walker (2 wheels) Gait Pattern/deviations: Step-to pattern;Decreased stance time - right       General Gait Details: cues for sequence, trunk and hip extension   Stairs             Wheelchair Mobility     Modified Rankin (Stroke Patients Only)       Balance     Sitting balance-Leahy Scale: Fair     Standing balance support: Bilateral upper extremity supported;During functional activity;Reliant on assistive device for balance Standing balance-Leahy Scale: Poor Standing balance comment: reliant on UEs                            Cognition Arousal/Alertness: Awake/alert Behavior During Therapy: WFL for tasks assessed/performed Overall Cognitive Status: Within Functional Limits for tasks assessed                                          Exercises Total Joint Exercises Ankle Circles/Pumps: AROM;10 reps;Both Quad Sets: AROM;Both;5 reps Heel Slides:  (pt reports doign on her own with use of gait belt)    General Comments        Pertinent Vitals/Pain Pain Assessment: 0-10 Pain Score: 5  Pain Location: R hip Pain Descriptors / Indicators: Aching;Grimacing;Sore Pain Intervention(s): Limited activity within patient's tolerance;Monitored during session;Premedicated before session;Repositioned    Home Living                          Prior Function            PT Goals (current goals can now be found in the care plan section) Acute Rehab PT Goals Patient Stated Goal: feel better, have less pain PT Goal Formulation: With patient Time For Goal Achievement: 02/17/21  Potential to Achieve Goals: Good Progress towards PT goals: Progressing toward goals    Frequency    7X/week      PT Plan Current plan remains appropriate    Co-evaluation              AM-PAC PT "6 Clicks" Mobility   Outcome Measure  Help needed turning from your back to your side while in a flat bed without using bedrails?: A Little Help needed moving from lying on your back to sitting on the side of a flat bed without using bedrails?: A Little Help needed moving to and from a bed to a chair (including a wheelchair)?: Total Help needed standing up from a  chair using your arms (e.g., wheelchair or bedside chair)?: A Little Help needed to walk in hospital room?: A Little Help needed climbing 3-5 steps with a railing? : A Lot 6 Click Score: 15    End of Session Equipment Utilized During Treatment: Gait belt Activity Tolerance: Patient tolerated treatment well Patient left: in chair;with call bell/phone within reach;with chair alarm set;with family/visitor present Nurse Communication: Mobility status PT Visit Diagnosis: Other abnormalities of gait and mobility (R26.89);Difficulty in walking, not elsewhere classified (R26.2)     Time: 6734-1937 PT Time Calculation (min) (ACUTE ONLY): 29 min  Charges:  $Gait Training: 23-37 mins                     Baxter Flattery, PT  Acute Rehab Dept ([place]) [phone] Pager [phone]  02/10/2021    [facility] 02/10/2021, 4:50 PM

## 2021-02-10 NOTE — Evaluation (Signed)
Physical Therapy Evaluation Patient Details Name: Diana Patterson MRN: [id number] DOB: January 15, 1946 Today's Date: 02/10/2021  History of Present Illness  75 yo female s/p R THA. PMH: chronic anemia, STEMI, CAD, TIA  Clinical Impression  Pt is s/p THA resulting in the deficits listed below (see PT Problem List).  Limited to stand pivot transfers this am d/t c/o "wooziness" and pain. Continue to follow   Pt will benefit from skilled PT to increase their independence and safety with mobility to allow discharge to the venue listed below.         Recommendations for follow up therapy are one component of a multi-disciplinary discharge planning process, led by the attending physician.  Recommendations may be updated based on patient status, additional functional criteria and insurance authorization.  Follow Up Recommendations Follow physician's recommendations for discharge plan and follow up therapies    Assistance Recommended at Discharge Frequent or constant Supervision/Assistance  Functional Status Assessment Patient has had a recent decline in their functional status and demonstrates the ability to make significant improvements in function in a reasonable and predictable amount of time.  Equipment Recommendations  Rolling walker (2 wheels);BSC/3in1 (delivered)    Recommendations for Other Services       Precautions / Restrictions Precautions Precautions: Fall Restrictions Weight Bearing Restrictions: No Other Position/Activity Restrictions: WBAT      Mobility  Bed Mobility Overal bed mobility: Needs Assistance Bed Mobility: Sit to Supine       Sit to supine: Min assist;Min guard   General bed mobility comments: assist with RLE, educated on useof gait belt to self assist    Transfers Overall transfer level: Needs assistance Equipment used: Rolling walker (2 wheels) Transfers: Sit to/from Stand;Bed to chair/wheelchair/BSC Sit to Stand: Min assist;Mod assist Stand  pivot transfers: Mod assist;Min assist         General transfer comment: cues for hand placement, assist for anterior-superior wt shift, repeated attempts with pt reliant on momentum to stand. 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    Modified Rankin (Stroke Patients Only)       Balance Overall balance assessment: Needs assistance Sitting-balance support: Feet supported;No upper extremity supported Sitting balance-Leahy Scale: Fair     Standing balance support: Bilateral upper extremity supported;During functional activity;Reliant on assistive device for balance Standing balance-Leahy Scale: Poor Standing balance comment: reliant on UEs and external support                             Pertinent Vitals/Pain Pain Assessment: 0-10 Pain Score: 4  Pain Location: R hip Pain Descriptors / Indicators: Aching;Grimacing;Sore Pain Intervention(s): Premedicated before session;Monitored during session;Limited activity within patient's tolerance;Repositioned;Ice applied    Home Living Family/patient expects to be discharged to:: Private residence Living Arrangements: Alone Available Help at Discharge: Family (niece staying, has aide and local family) Type of Home: House Home Access: Ramped entrance       Home Layout: One level;Laundry or work area in [place]: Conservation officer, nature (2 wheels) Additional Comments: aide does chores, meals 5days/wk 8-11am    Prior Function Prior Level of Function : Independent/Modified Independent             Mobility Comments:  amb with RW       Hand Dominance        Extremity/Trunk Assessment   Upper Extremity Assessment Upper Extremity Assessment: Overall WFL for tasks assessed    Lower Extremity Assessment Lower Extremity Assessment: RLE  deficits/detail RLE Deficits / Details: ankle WFL, knee and hip grossly 2+/5 limited by post op pain RLE: Unable to fully assess due to pain       Communication   Communication: No difficulties  Cognition Arousal/Alertness: Awake/alert Behavior During Therapy: WFL for tasks assessed/performed Overall Cognitive Status: Within Functional Limits for tasks assessed                                          General Comments      Exercises Total Joint Exercises Ankle Circles/Pumps: AROM;10 reps;Both Heel Slides: AROM;10 reps;Right   Assessment/Plan    PT Assessment Patient needs continued PT services  PT Problem List Decreased strength;Decreased mobility;Decreased activity tolerance;Decreased balance;Decreased knowledge of use of DME;Pain;Decreased range of motion       PT Treatment Interventions DME instruction;Therapeutic activities;Gait training;Functional mobility training;Therapeutic exercise;Patient/family education;Balance training    PT Goals (Current goals can be found in the Care Plan section)  Acute Rehab PT Goals Patient Stated Goal: feel better, have less pain PT Goal Formulation: With patient Time For Goal Achievement: 02/17/21 Potential to Achieve Goals: Good    Frequency 7X/week   Barriers to discharge        Co-evaluation               AM-PAC PT "6 Clicks" Mobility  Outcome Measure Help needed turning from your back to your side while in a flat bed without using bedrails?: A Little Help needed moving from lying on your back to sitting on the side of a flat bed without using bedrails?: A Little   Help needed standing up from a chair using your arms (e.g., wheelchair or bedside chair)?: A Lot Help needed to walk in hospital room?: A Lot Help needed climbing 3-5 steps with a railing? : Total 6 Click Score: 11    End of Session Equipment Utilized During Treatment: Gait belt Activity Tolerance: Patient tolerated treatment  well Patient left: with call bell/phone within reach;in bed;with bed alarm set;with family/visitor present Nurse Communication: Mobility status PT Visit Diagnosis: Other abnormalities of gait and mobility (R26.89);Difficulty in walking, not elsewhere classified (R26.2)    Time: 3893-7342 PT Time Calculation (min) (ACUTE ONLY): 19 min   Charges:   PT Evaluation $PT Eval Low Complexity: Harper, PT  Acute Rehab Dept ([facility]) [phone] Pager [phone]  02/10/2021   [facility] 02/10/2021, 10:45 AM

## 2021-02-10 NOTE — TOC Transition Note (Signed)
Transition of Care [facility]) - CM/SW Discharge Note  Patient Details  Name: Diana Patterson MRN: [id number] Date of Birth: 1945/12/27  [facility] CM/SW Contact:  Sherie Don, LCSW Phone Number: 02/10/2021, 10:10 AM  Clinical Narrative: Patient is expected to discharge home after working with PT. CSW met with patient to confirm discharge plan. Patient will discharge home with a home exercise program (HEP). Patient will need a rolling walker and 3N1. [employer] delivered DME to patient's room. TOC signing off.  Final next level of care: Home/Self Care Barriers to Discharge: No Barriers Identified  Patient Goals and CMS Choice Patient states their goals for this hospitalization and ongoing recovery are:: Discharge home with HEP CMS Medicare.gov Compare Post Acute Care list provided to:: Patient Choice offered to / list presented to : Patient  Discharge Plan and Services         DME Arranged: 3-N-1, Walker rolling DME Agency: [employer] Representative spoke with at DME Agency: Prearranged in orthopedist's office HH Arranged: NA [place] Agency: NA  Readmission Risk Interventions No flowsheet data found.

## 2021-02-10 NOTE — Progress Notes (Signed)
   Subjective: 1 Day Post-Op Procedure(s) (LRB): TOTAL HIP ARTHROPLASTY ANTERIOR APPROACH (Right) Patient reports pain as mild.   Patient seen in rounds by Dr. Wynelle Link. Patient is well, and has had no acute complaints or problems. No issues overnight. Denies chest pain or SOB. Foley catheter removed this AM. We will begin therapy today.   Objective: Vital signs in last 24 hours: Temp:  [97.4 F (36.3 C)-98.1 F (36.7 C)] 98 F (36.7 C) (11/22 0442) Pulse Rate:  [59-71] 63 (11/22 0442) Resp:  [12-20] 16 (11/22 0442) BP: (117-155)/(55-113) 127/55 (11/22 0442) SpO2:  [98 %-100 %] 100 % (11/22 0442) Weight:  [75.5 kg] 75.5 kg (11/21 1239)  Intake/Output from previous day:  Intake/Output Summary (Last 24 hours) at 02/10/2021 0807 Last data filed at 02/10/2021 0648 Gross per 24 hour  Intake 2902.47 ml  Output 2000 ml  Net 902.47 ml     Intake/Output this shift: No intake/output data recorded.  Labs: Recent Labs    02/10/21 0306  HGB 7.8*   Recent Labs    02/10/21 0306  WBC 8.3  RBC 2.24*  HCT 22.9*  PLT 146*   Recent Labs    02/10/21 0306  NA 138  K 4.6  CL 110  CO2 23  BUN 21  CREATININE 1.05*  GLUCOSE 140*  CALCIUM 9.0   No results for input(s): LABPT, INR in the last 72 hours.  Exam: General - Patient is Alert and Oriented Extremity - Neurologically intact Neurovascular intact Sensation intact distally Dorsiflexion/Plantar flexion intact Dressing - dressing C/D/I Motor Function - intact, moving foot and toes well on exam.   Past Medical History:  Diagnosis Date   Arthritis    CAD (coronary artery disease)    DES to circumflex June 2017, moderate residual LAD disease   CKD (chronic kidney disease) stage 3, GFR 30-59 ml/min (HCC)    Essential hypertension    Mixed hyperlipidemia    Muscle cramps at night    NSTEMI (non-ST elevated myocardial infarction) ([place]) 2016   Probable type II event, negative Cardiolite 2012   Sleep apnea    Stroke  ([place])    tia   TIA (transient ischemic attack)    Type 2 diabetes mellitus (HCC)     Assessment/Plan: 1 Day Post-Op Procedure(s) (LRB): TOTAL HIP ARTHROPLASTY ANTERIOR APPROACH (Right) Principal Problem:   OA (osteoarthritis) of hip Active Problems:   Primary osteoarthritis of right hip  Estimated body mass index is 28.57 kg/m as calculated from the following:   Height as of this encounter: 5\' 4"  (1.626 m).   Weight as of this encounter: 75.5 kg. Advance diet Up with therapy  DVT Prophylaxis - Xarelto Weight bearing as tolerated. Begin therapy.  Hemoglobin 7.8 this AM. Hx of chronic anemia. Will recheck with CBC in the AM, patient currently asymptomatic.   Plan is to go Home after hospital stay. Plan for discharge tomorrow pending progression with therapy and labs.   Theresa Duty, PA-C Orthopedic Surgery [phone] 02/10/2021, 8:07 AM

## 2021-02-11 LAB — CBC
HCT: 23.1 % — ABNORMAL LOW (ref 36.0–46.0)
Hemoglobin: 7.7 g/dL — ABNORMAL LOW (ref 12.0–15.0)
MCH: 34.2 pg — ABNORMAL HIGH (ref 26.0–34.0)
MCHC: 33.3 g/dL (ref 30.0–36.0)
MCV: 102.7 fL — ABNORMAL HIGH (ref 80.0–100.0)
Platelets: 136 10*3/uL — ABNORMAL LOW (ref 150–400)
RBC: 2.25 MIL/uL — ABNORMAL LOW (ref 3.87–5.11)
RDW: 14.7 % (ref 11.5–15.5)
WBC: 10.5 10*3/uL (ref 4.0–10.5)
nRBC: 0 % (ref 0.0–0.2)

## 2021-02-11 LAB — BASIC METABOLIC PANEL
Anion gap: 3 — ABNORMAL LOW (ref 5–15)
BUN: 21 mg/dL (ref 8–23)
CO2: 24 mmol/L (ref 22–32)
Calcium: 8.9 mg/dL (ref 8.9–10.3)
Chloride: 108 mmol/L (ref 98–111)
Creatinine, Ser: 0.99 mg/dL (ref 0.44–1.00)
GFR, Estimated: 59 mL/min — ABNORMAL LOW (ref >60)
Glucose, Bld: 111 mg/dL — ABNORMAL HIGH (ref 70–99)
Potassium: 4.2 mmol/L (ref 3.5–5.1)
Sodium: 135 mmol/L (ref 135–145)

## 2021-02-11 MED ORDER — RIVAROXABAN 10 MG PO TABS
10.0000 mg | ORAL_TABLET | Freq: Every day | ORAL | 0 refills | Status: DC
Start: 2021-02-11 — End: 2021-06-25

## 2021-02-11 MED ORDER — TRAMADOL HCL 50 MG PO TABS
50.0000 mg | ORAL_TABLET | Freq: Four times a day (QID) | ORAL | 0 refills | Status: DC | PRN
Start: 2021-02-11 — End: 2022-07-02

## 2021-02-11 MED ORDER — METHOCARBAMOL 500 MG PO TABS: 500.0000 mg | ORAL_TABLET | Freq: Four times a day (QID) | ORAL | 0 refills | Status: DC | PRN

## 2021-02-11 MED ORDER — HYDROCODONE-ACETAMINOPHEN 5-325 MG PO TABS: 1.0000 | ORAL_TABLET | Freq: Four times a day (QID) | ORAL | 0 refills | Status: DC | PRN

## 2021-02-11 NOTE — Plan of Care (Signed)
Discharge instructions given to the Patient

## 2021-02-11 NOTE — Progress Notes (Signed)
   Subjective: 2 Days Post-Op Procedure(s) (LRB): TOTAL HIP ARTHROPLASTY ANTERIOR APPROACH (Right) Patient reports pain as mild.   Patient seen in rounds by Dr. Wynelle Link. Patient is well, and has had no acute complaints or problems. Denies chest pain or Sob. No issues overnight.  Plan is to go Home after hospital stay.  Objective: Vital signs in last 24 hours: Temp:  [97.9 F (36.6 C)-99 F (37.2 C)] 98.9 F (37.2 C) (11/23 0438) Pulse Rate:  [62-73] 73 (11/23 0438) Resp:  [16-20] 18 (11/23 0438) BP: (125-136)/(38-60) 125/58 (11/23 0438) SpO2:  [96 %-99 %] 96 % (11/23 0438)  Intake/Output from previous day:  Intake/Output Summary (Last 24 hours) at 02/11/2021 0712 Last data filed at 02/11/2021 0540 Gross per 24 hour  Intake 725.04 ml  Output 450 ml  Net 275.04 ml    Intake/Output this shift: No intake/output data recorded.  Labs: Recent Labs    02/10/21 0306 02/11/21 0319  HGB 7.8* 7.7*   Recent Labs    02/10/21 0306 02/11/21 0319  WBC 8.3 10.5  RBC 2.24* 2.25*  HCT 22.9* 23.1*  PLT 146* 136*   Recent Labs    02/10/21 0306 02/11/21 0319  NA 138 135  K 4.6 4.2  CL 110 108  CO2 23 24  BUN 21 21  CREATININE 1.05* 0.99  GLUCOSE 140* 111*  CALCIUM 9.0 8.9   No results for input(s): LABPT, INR in the last 72 hours.  Exam: General - Patient is Alert and Oriented Extremity - Neurologically intact Neurovascular intact Sensation intact distally Dorsiflexion/Plantar flexion intact Dressing/Incision - clean, dry, no drainage Motor Function - intact, moving foot and toes well on exam.   Past Medical History:  Diagnosis Date   Arthritis    CAD (coronary artery disease)    DES to circumflex June 2017, moderate residual LAD disease   CKD (chronic kidney disease) stage 3, GFR 30-59 ml/min (HCC)    Essential hypertension    Mixed hyperlipidemia    Muscle cramps at night    NSTEMI (non-ST elevated myocardial infarction) ([place]) 2016   Probable type II  event, negative Cardiolite 2012   Sleep apnea    Stroke ([place])    tia   TIA (transient ischemic attack)    Type 2 diabetes mellitus (HCC)     Assessment/Plan: 2 Days Post-Op Procedure(s) (LRB): TOTAL HIP ARTHROPLASTY ANTERIOR APPROACH (Right) Principal Problem:   OA (osteoarthritis) of hip Active Problems:   Primary osteoarthritis of right hip  Estimated body mass index is 28.57 kg/m as calculated from the following:   Height as of this encounter: 5\' 4"  (1.626 m).   Weight as of this encounter: 75.5 kg. Up with therapy  DVT Prophylaxis - Xarelto Weight-bearing as tolerated  Hemoglobin stable at 7.7 this AM, patient asymptomatic with therapy yesterday. Plan for discharge to home with HEP this afternoon once cleared by PT. Follow-up in the office in 2 weeks  The PDMP database was reviewed today prior to any opioid medications being prescribed to this patient.   Theresa Duty, PA-C Orthopedic Surgery [phone] 02/11/2021, 7:12 AM

## 2021-02-11 NOTE — Progress Notes (Signed)
Physical Therapy Treatment Patient Details Name: Diana Patterson MRN: [id number] DOB: 22-Aug-1945 Today's Date: 02/11/2021   History of Present Illness 75 yo female s/p R THA. PMH: chronic anemia, STEMI, CAD, TIA    PT Comments    Progressing with mobility. Reviewed/practiced exercises and gait training. Issued HEP for pt to perform 2x/day. Pt mentioned that she asked surgeon about HHPT f/u. All education completed.  Recommendations for follow up therapy are one component of a multi-disciplinary discharge planning process, led by the attending physician.  Recommendations may be updated based on patient status, additional functional criteria and insurance authorization.  Follow Up Recommendations  Follow physician's recommendations for discharge plan and follow up therapies     Assistance Recommended at Discharge Frequent or constant Supervision/Assistance  Equipment Recommendations  Rolling walker (2 wheels);BSC/3in1    Recommendations for Other Services       Precautions / Restrictions Precautions Precautions: Fall Restrictions Weight Bearing Restrictions: No Other Position/Activity Restrictions: WBAT     Mobility  Bed Mobility Overal bed mobility: Needs Assistance Bed Mobility: Supine to Sit     Supine to sit: Min guard;HOB elevated     General bed mobility comments: Pt used gait belt to A R LE off bed. Increased time.    Transfers Overall transfer level: Needs assistance Equipment used: Rolling walker (2 wheels) Transfers: Sit to/from Stand Sit to Stand: Min assist           General transfer comment: Assist to rise, steady, control descent. Cues for safety, technique, hand /LE placement    Ambulation/Gait Ambulation/Gait assistance: Min guard Gait Distance (Feet): 75 Feet Assistive device: Rolling walker (2 wheels) Gait Pattern/deviations: Step-to pattern       General Gait Details: Min guard for safety. Cues for safety,  posture.   Stairs             Wheelchair Mobility    Modified Rankin (Stroke Patients Only)       Balance Overall balance assessment: Needs assistance         Standing balance support: Bilateral upper extremity supported;Reliant on assistive device for balance Standing balance-Leahy Scale: Poor                              Cognition Arousal/Alertness: Awake/alert Behavior During Therapy: WFL for tasks assessed/performed Overall Cognitive Status: Within Functional Limits for tasks assessed                                          Exercises Total Joint Exercises Ankle Circles/Pumps: AROM;Both;10 reps Quad Sets: AROM;Both;10 reps Heel Slides: AAROM;AROM;Right;10 reps (with gait belt to assist) Hip ABduction/ADduction: AAROM;Right;10 reps    General Comments        Pertinent Vitals/Pain Pain Assessment: Faces Faces Pain Scale: Hurts little more Pain Location: R hip Pain Descriptors / Indicators: Discomfort;Sore Pain Intervention(s): Monitored during session;Repositioned    Home Living                          Prior Function            PT Goals (current goals can now be found in the care plan section) Progress towards PT goals: Progressing toward goals    Frequency    7X/week      PT Plan Current plan remains appropriate  Co-evaluation              AM-PAC PT "6 Clicks" Mobility   Outcome Measure  Help needed turning from your back to your side while in a flat bed without using bedrails?: A Little Help needed moving from lying on your back to sitting on the side of a flat bed without using bedrails?: A Little Help needed moving to and from a bed to a chair (including a wheelchair)?: A Little Help needed standing up from a chair using your arms (e.g., wheelchair or bedside chair)?: A Little Help needed to walk in hospital room?: A Little Help needed climbing 3-5 steps with a railing? : A Lot 6  Click Score: 17    End of Session Equipment Utilized During Treatment: Gait belt Activity Tolerance: Patient tolerated treatment well Patient left: in chair;with call bell/phone within reach   PT Visit Diagnosis: Other abnormalities of gait and mobility (R26.89)     Time: 8022-3361 PT Time Calculation (min) (ACUTE ONLY): 23 min  Charges:  $Gait Training: 8-22 mins $Therapeutic Exercise: 8-22 mins                        Doreatha Massed, PT Acute Rehabilitation  Office: [phone] Pager: [phone]

## 2021-02-11 NOTE — Plan of Care (Signed)
  Problem: Clinical Measurements: Goal: Ability to maintain clinical measurements within normal limits will improve Outcome: Progressing   Problem: Activity: Goal: Risk for activity intolerance will decrease Outcome: Progressing   Problem: Pain Managment: Goal: General experience of comfort will improve Outcome: Progressing   

## 2021-02-18 NOTE — Discharge Summary (Signed)
Physician Discharge Summary   Patient ID: Diana Patterson MRN: [id number] DOB/AGE: 76-Jan-1947 39 y.o.  Admit date: 02/09/2021 Discharge date: 02/11/2021  Primary Diagnosis: Osteoarthritis, right hip   Admission Diagnoses:  Past Medical History:  Diagnosis Date   Arthritis    CAD (coronary artery disease)    DES to circumflex June 2017, moderate residual LAD disease   CKD (chronic kidney disease) stage 3, GFR 30-59 ml/min (HCC)    Essential hypertension    Mixed hyperlipidemia    Muscle cramps at night    NSTEMI (non-ST elevated myocardial infarction) ([place]) 2016   Probable type II event, negative Cardiolite 2012   Sleep apnea    Stroke ([place])    tia   TIA (transient ischemic attack)    Type 2 diabetes mellitus ([place])    Discharge Diagnoses:   Principal Problem:   OA (osteoarthritis) of hip Active Problems:   Primary osteoarthritis of right hip  Estimated body mass index is 28.57 kg/m as calculated from the following:   Height as of this encounter: 5\' 4"  (1.626 m).   Weight as of this encounter: 75.5 kg.  Procedure:  Procedure(s) (LRB): TOTAL HIP ARTHROPLASTY ANTERIOR APPROACH (Right)   Consults: None  HPI Diana Patterson is a 75 y.o. female who has advanced end-  stage arthritis of their Right  hip with progressively worsening pain and  dysfunction.The patient has failed nonoperative management and presents for  total hip arthroplasty.   Laboratory Data: Admission on 02/09/2021, Discharged on 02/11/2021  Component Date Value Ref Range Status   Glucose-Capillary 02/09/2021 90  70 - 99 mg/dL Final   Glucose reference range applies only to samples taken after fasting for at least 8 hours.   WBC 02/10/2021 8.3  4.0 - 10.5 K/uL Final   RBC 02/10/2021 2.24 (L)  3.87 - 5.11 MIL/uL Final   Hemoglobin 02/10/2021 7.8 (L)  12.0 - 15.0 g/dL Final   HCT 02/10/2021 22.9 (L)  36.0 - 46.0 % Final   MCV 02/10/2021 102.2 (H)  80.0 - 100.0 fL Final   MCH 02/10/2021  34.8 (H)  26.0 - 34.0 pg Final   MCHC 02/10/2021 34.1  30.0 - 36.0 g/dL Final   RDW 02/10/2021 14.9  11.5 - 15.5 % Final   Platelets 02/10/2021 146 (L)  150 - 400 K/uL Final   nRBC 02/10/2021 0.0  0.0 - 0.2 % Final   Performed at [facility], [place] [address], [place], [place] [address]   Sodium 02/10/2021 138  135 - 145 mmol/L Final   Potassium 02/10/2021 4.6  3.5 - 5.1 mmol/L Final   Chloride 02/10/2021 110  98 - 111 mmol/L Final   CO2 02/10/2021 23  22 - 32 mmol/L Final   Glucose, Bld 02/10/2021 140 (H)  70 - 99 mg/dL Final   Glucose reference range applies only to samples taken after fasting for at least 8 hours.   BUN 02/10/2021 21  8 - 23 mg/dL Final   Creatinine, Ser 02/10/2021 1.05 (H)  0.44 - 1.00 mg/dL Final   Calcium 02/10/2021 9.0  8.9 - 10.3 mg/dL Final   GFR, Estimated 02/10/2021 55 (L)  >60 mL/min Final   Comment: (NOTE) Calculated using the CKD-EPI Creatinine Equation (2021)    Anion gap 02/10/2021 5  5 - 15 Final   Performed at [facility], [place] [address], [place], [place] [address]   WBC 02/11/2021 10.5  4.0 - 10.5 K/uL Final   RBC 02/11/2021  2.25 (L)  3.87 - 5.11 MIL/uL Final   Hemoglobin 02/11/2021 7.7 (L)  12.0 - 15.0 g/dL Final   HCT 02/11/2021 23.1 (L)  36.0 - 46.0 % Final   MCV 02/11/2021 102.7 (H)  80.0 - 100.0 fL Final   MCH 02/11/2021 34.2 (H)  26.0 - 34.0 pg Final   MCHC 02/11/2021 33.3  30.0 - 36.0 g/dL Final   RDW 02/11/2021 14.7  11.5 - 15.5 % Final   Platelets 02/11/2021 136 (L)  150 - 400 K/uL Final   nRBC 02/11/2021 0.0  0.0 - 0.2 % Final   Performed at [facility], [place] [address], [place], [place] [address]   Sodium 02/11/2021 135  135 - 145 mmol/L Final   Potassium 02/11/2021 4.2  3.5 - 5.1 mmol/L Final   Chloride 02/11/2021 108  98 - 111 mmol/L Final   CO2 02/11/2021 24  22 - 32 mmol/L Final   Glucose, Bld 02/11/2021 111 (H)  70 - 99 mg/dL Final   Glucose reference range applies only to  samples taken after fasting for at least 8 hours.   BUN 02/11/2021 21  8 - 23 mg/dL Final   Creatinine, Ser 02/11/2021 0.99  0.44 - 1.00 mg/dL Final   Calcium 02/11/2021 8.9  8.9 - 10.3 mg/dL Final   GFR, Estimated 02/11/2021 59 (L)  >60 mL/min Final   Comment: (NOTE) Calculated using the CKD-EPI Creatinine Equation (2021)    Anion gap 02/11/2021 3 (L)  5 - 15 Final   Performed at [facility], [place] [address], [place], [place] [address]  Orders Only on 02/05/2021  Component Date Value Ref Range Status   SARS Coronavirus 2 02/05/2021 RESULT: NEGATIVE   Final   Comment: RESULT: NEGATIVESARS-CoV-2 INTERPRETATION:A NEGATIVE  test result means that SARS-CoV-2 RNA was not present in the specimen above the limit of detection of this test. This does not preclude a possible SARS-CoV-2 infection and should not be used as the  sole basis for patient management decisions. Negative results must be combined with clinical observations, patient history, and epidemiological information. Optimum specimen types and timing for peak viral levels during infections caused by SARS-CoV-2  have not been determined. Collection of multiple specimens or types of specimens may be necessary to detect virus. Improper specimen collection and handling, sequence variability under primers/probes, or organism present below the limit of detection may  lead to false negative results. Positive and negative predictive values of testing are highly dependent on prevalence. False negative test results are more likely when prevalence of disease is high.The expected result is NEGATIVE.Fact S                          heet for  Healthcare Providers: LocalChronicle.no Sheet for Patients: SalonLookup.es Reference Range - Negative   Hospital Outpatient Visit on 02/02/2021  Component Date Value Ref Range Status   MRSA, PCR 02/02/2021 NEGATIVE  NEGATIVE Final    Staphylococcus aureus 02/02/2021 NEGATIVE  NEGATIVE Final   Comment: (NOTE) The Xpert SA Assay (FDA approved for NASAL specimens in patients 77 years of age and older), is one component of a comprehensive surveillance program. It is not intended to diagnose infection nor to guide or monitor treatment. Performed at [facility], [place] [address], [place], [place] [address]    Hgb A1c MFr Bld 02/02/2021 4.3 (L)  4.8 - 5.6 % Final   Comment: (NOTE) Pre diabetes:  5.7%-6.4%  Diabetes:              >6.4%  Glycemic control for   <7.0% adults with diabetes    Mean Plasma Glucose 02/02/2021 76.71  mg/dL Final   Performed at [facility], [place] [address], [place], [place] [address]   WBC 02/02/2021 4.9  4.0 - 10.5 K/uL Final   RBC 02/02/2021 3.07 (L)  3.87 - 5.11 MIL/uL Final   Hemoglobin 02/02/2021 10.6 (L)  12.0 - 15.0 g/dL Final   HCT 02/02/2021 31.2 (L)  36.0 - 46.0 % Final   MCV 02/02/2021 101.6 (H)  80.0 - 100.0 fL Final   MCH 02/02/2021 34.5 (H)  26.0 - 34.0 pg Final   MCHC 02/02/2021 34.0  30.0 - 36.0 g/dL Final   RDW 02/02/2021 15.3  11.5 - 15.5 % Final   Platelets 02/02/2021 196  150 - 400 K/uL Final   nRBC 02/02/2021 0.0  0.0 - 0.2 % Final   Performed at [facility], [place] [address], [place], [place] [address]   Sodium 02/02/2021 136  135 - 145 mmol/L Final   Potassium 02/02/2021 4.2  3.5 - 5.1 mmol/L Final   Chloride 02/02/2021 105  98 - 111 mmol/L Final   CO2 02/02/2021 21 (L)  22 - 32 mmol/L Final   Glucose, Bld 02/02/2021 96  70 - 99 mg/dL Final   Glucose reference range applies only to samples taken after fasting for at least 8 hours.   BUN 02/02/2021 29 (H)  8 - 23 mg/dL Final   Creatinine, Ser 02/02/2021 1.23 (H)  0.44 - 1.00 mg/dL Final   Calcium 02/02/2021 9.5  8.9 - 10.3 mg/dL Final   Total Protein 02/02/2021 7.5  6.5 - 8.1 g/dL Final   Albumin 02/02/2021 3.4 (L)  3.5 - 5.0 g/dL Final   AST 02/02/2021 42 (H)  15 -  41 U/L Final   ALT 02/02/2021 29  0 - 44 U/L Final   Alkaline Phosphatase 02/02/2021 97  38 - 126 U/L Final   Total Bilirubin 02/02/2021 0.9  0.3 - 1.2 mg/dL Final   GFR, Estimated 02/02/2021 46 (L)  >60 mL/min Final   Comment: (NOTE) Calculated using the CKD-EPI Creatinine Equation (2021)    Anion gap 02/02/2021 10  5 - 15 Final   Performed at [facility], [place] [address], [place], [place] [address]   ABO/RH(D) 02/02/2021 A POS   Final   Antibody Screen 02/02/2021 NEG   Final   Sample Expiration 02/02/2021 02/12/2021,2359   Final   Extend sample reason 02/02/2021    Final                   Value:NO TRANSFUSIONS OR PREGNANCY IN THE PAST 3 MONTHS Performed at [place] [address], [place], [place] [address]    Prothrombin Time 02/02/2021 15.0  11.4 - 15.2 seconds Final   INR 02/02/2021 1.2  0.8 - 1.2 Final   Comment: (NOTE) INR goal varies based on device and disease states. Performed at [facility], [place] [address] [place], [place] [address]    Glucose-Capillary 02/02/2021 106 (H)  70 - 99 mg/dL Final   Glucose reference range applies only to samples taken after fasting for at least 8 hours.     X-Rays:DG Pelvis Portable  Result Date: 02/09/2021 CLINICAL DATA:  Status post right hip replacement. EXAM: PORTABLE PELVIS 1-2 VIEWS COMPARISON:  None. FINDINGS: Right hip  arthroplasty in expected alignment. No periprosthetic lucency or fracture. Recent postsurgical change includes air and edema in the soft tissues. IMPRESSION: Right hip arthroplasty without immediate postoperative complication. Electronically Signed   By: Keith Rake M.D.   On: 02/09/2021 18:02   DG C-Arm 1-60 Min-No Report  Result Date: 02/09/2021 Fluoroscopy was utilized by the requesting physician.  No radiographic interpretation.   DG HIP UNILAT WITH PELVIS 2-3 VIEWS RIGHT  Result Date: 02/09/2021 CLINICAL DATA:  Right hip replacement. EXAM:  DG HIP (WITH OR WITHOUT PELVIS) 2-3V RIGHT COMPARISON:  None. FINDINGS: Eight fluoroscopic spot views of the pelvis and right hip obtained in the operating room. Initial images demonstrate advanced right hip osteoarthritis with subsequent right hip arthroplasty. Fluoroscopy time 6 seconds. IMPRESSION: Intraoperative fluoroscopy during right hip arthroplasty. Electronically Signed   By: Keith Rake M.D.   On: 02/09/2021 18:02    EKG: Orders placed or performed in visit on 06/25/20   EKG 12-Lead     Hospital Course: Diana Patterson is a 75 y.o. who was admitted to [facility]. They were brought to the operating room on 02/09/2021 and underwent Procedure(s): [place].  Patient tolerated the procedure well and was later transferred to the recovery room and then to the orthopaedic floor for postoperative care. They were given PO and IV analgesics for pain control following their surgery. They were given 24 hours of postoperative antibiotics of  Anti-infectives (From admission, onward)    Start     Dose/Rate Route Frequency Ordered Stop   02/10/21 1000  valACYclovir (VALTREX) tablet 500 mg  Status:  Discontinued        500 mg Oral Daily 02/09/21 1840 02/11/21 1821   02/09/21 2200  ceFAZolin (ANCEF) IVPB 2g/100 mL premix        2 g 200 mL/hr over 30 Minutes Intravenous Every 6 hours 02/09/21 1840 02/10/21 0506   02/09/21 1245  ceFAZolin (ANCEF) IVPB 2g/100 mL premix        2 g 200 mL/hr over 30 Minutes Intravenous On call to O.R. 02/09/21 1235 02/09/21 1558      and started on DVT prophylaxis in the form of Xarelto.   PT and OT were ordered for total joint protocol. Discharge planning consulted to help with postop disposition and equipment needs. Patient had a good night on the evening of surgery. They started to get up OOB with therapy on POD #1. Hemoglobin at 7.8, history of chronic anemia with baseline between 9-10. Patient was asymptomatic,  rechecked on day 2. Continued to work with therapy into POD #2. Pt was seen during rounds on day two and was ready to go home pending progress with therapy. Aquacel dressing was clean, dry, and intact. Hemoglobin 7.7 continued to be asymptomatic, transfusion was not required. Pt worked with therapy for one additional session and was meeting their goals. She was discharged to home later that day in stable condition.  Diet: Diabetic diet Activity: WBAT Follow-up: in 2 weeks Disposition: Home with HEP Discharged Condition: stable   Discharge Instructions     Call MD / Call 911   Complete by: As directed    If you experience chest pain or shortness of breath, CALL 911 and be transported to the hospital emergency room.  If you develope a fever above 101 F, pus (white drainage) or increased drainage or redness at the wound, or calf pain, call your surgeon's office.   Change dressing   Complete by: As  directed    You have an adhesive waterproof bandage over the incision. Leave this in place until your first follow-up appointment. Once you remove this you will not need to place another bandage.   Constipation Prevention   Complete by: As directed    Drink plenty of fluids.  Prune juice may be helpful.  You may use a stool softener, such as Colace (over the counter) 100 mg twice a day.  Use MiraLax (over the counter) for constipation as needed.   Diet - low sodium heart healthy   Complete by: As directed    Do not sit on low chairs, stoools or toilet seats, as it may be difficult to get up from low surfaces   Complete by: As directed    Driving restrictions   Complete by: As directed    No driving for two weeks   Post-operative opioid taper instructions:   Complete by: As directed    POST-OPERATIVE OPIOID TAPER INSTRUCTIONS: It is important to wean off of your opioid medication as soon as possible. If you do not need pain medication after your surgery it is ok to stop day one. Opioids  include: Codeine, Hydrocodone(Norco, Vicodin), Oxycodone(Percocet, oxycontin) and hydromorphone amongst others.  Long term and even short term use of opiods can cause: Increased pain response Dependence Constipation Depression Respiratory depression And more.  Withdrawal symptoms can include Flu like symptoms Nausea, vomiting And more Techniques to manage these symptoms Hydrate well Eat regular healthy meals Stay active Use relaxation techniques(deep breathing, meditating, yoga) Do Not substitute Alcohol to help with tapering If you have been on opioids for less than two weeks and do not have pain than it is ok to stop all together.  Plan to wean off of opioids This plan should start within one week post op of your joint replacement. Maintain the same interval or time between taking each dose and first decrease the dose.  Cut the total daily intake of opioids by one tablet each day Next start to increase the time between doses. The last dose that should be eliminated is the evening dose.      TED hose   Complete by: As directed    Use stockings (TED hose) for three weeks on both leg(s).  You may remove them at night for sleeping.   Weight bearing as tolerated   Complete by: As directed       Allergies as of 02/11/2021       Reactions   Morphine And Related Other (See Comments)   Drowsiness        Medication List     STOP taking these medications    aspirin-sod bicarb-citric acid 325 MG Tbef tablet Commonly known as: ALKA-SELTZER   diclofenac Sodium 1 % Gel Commonly known as: VOLTAREN   Fish Oil 1000 MG Caps   OSCAL 500/200 D-3 PO   potassium chloride SA 20 MEQ tablet Commonly known as: KLOR-CON M       TAKE these medications    acetaminophen 650 MG CR tablet Commonly known as: TYLENOL Take 1,950 mg by mouth in the morning and at bedtime.   atorvastatin 80 MG tablet Commonly known as: LIPITOR Take 1 tablet (80 mg total) by mouth daily at 6  PM.   cetirizine 10 MG tablet Commonly known as: ZYRTEC Take 10 mg by mouth daily as needed for allergies.   Choline Fenofibrate 135 MG capsule Take 135 mg by mouth daily in the afternoon.   clotrimazole-betamethasone cream  Commonly known as: LOTRISONE Apply 1 application topically daily.   Dexilant 30 MG capsule DR Generic drug: Dexlansoprazole Take 30 mg by mouth daily with breakfast.   econazole nitrate 1 % cream Apply 1 application topically daily as needed (irritation).   folic acid 1 MG tablet Commonly known as: FOLVITE Take 1 mg by mouth See admin instructions. Take 1 mg by mouth daily except for Saturday   furosemide 20 MG tablet Commonly known as: LASIX Take 20 mg by mouth daily with breakfast.   HYDROcodone-acetaminophen 5-325 MG tablet Commonly known as: NORCO/VICODIN Take 1-2 tablets by mouth every 6 (six) hours as needed for severe pain.   LUBRICATING EYE DROPS OP Place 1 drop into both eyes daily as needed (dry eyes).   meclizine 25 MG tablet Commonly known as: ANTIVERT Take 25 mg by mouth every 6 (six) hours as needed for dizziness.   metaxalone 800 MG tablet Commonly known as: SKELAXIN Take 800 mg by mouth 2 (two) times daily.   methocarbamol 500 MG tablet Commonly known as: ROBAXIN Take 1 tablet (500 mg total) by mouth every 6 (six) hours as needed for muscle spasms.   methotrexate 2.5 MG tablet Commonly known as: RHEUMATREX Take 20 mg by mouth every Saturday. Take 8 tablets on Saturday for 20mg  dose.   metoprolol tartrate 50 MG tablet Commonly known as: LOPRESSOR Take 25 mg by mouth daily.   montelukast 10 MG tablet Commonly known as: SINGULAIR Take 10 mg by mouth daily in the afternoon.   nitroGLYCERIN 0.4 MG SL tablet Commonly known as: NITROSTAT Place 1 tablet (0.4 mg total) under the tongue every 5 (five) minutes as needed for chest pain.   ondansetron 4 MG disintegrating tablet Commonly known as: ZOFRAN-ODT Take 4 mg by mouth  every 4 (four) hours as needed for nausea/vomiting.   POLY-IRON 150 PO Take 150 mg by mouth at bedtime.   rivaroxaban 10 MG Tabs tablet Commonly known as: XARELTO Take 1 tablet (10 mg total) by mouth daily with breakfast for 20 days. Then take one 81 mg aspirin once a day for three weeks. Then discontinue aspirin.   traMADol 50 MG tablet Commonly known as: ULTRAM Take 1-2 tablets (50-100 mg total) by mouth every 6 (six) hours as needed for moderate pain.   valACYclovir 500 MG tablet Commonly known as: VALTREX Take 500 mg by mouth daily.               Discharge Care Instructions  (From admission, onward)           Start     Ordered   02/11/21 0000  Weight bearing as tolerated        02/11/21 0718   02/11/21 0000  Change dressing       Comments: You have an adhesive waterproof bandage over the incision. Leave this in place until your first follow-up appointment. Once you remove this you will not need to place another bandage.   02/11/21 4580            Follow-up Information     Gaynelle Arabian, MD. Go on 02/24/2021.   Specialty: Orthopedic Surgery Why: You are scheduled for a follow up appointment on 02-24-21 at 4:30 pm Contact information: [address] [place] [address] [phone]                 Signed: Theresa Duty, PA-C Orthopedic Surgery 02/18/2021, 12:27 PM

## 2021-02-25 DIAGNOSIS — M069 Rheumatoid arthritis, unspecified: Secondary | ICD-10-CM | POA: Diagnosis not present

## 2021-02-25 DIAGNOSIS — G4730 Sleep apnea, unspecified: Secondary | ICD-10-CM | POA: Diagnosis not present

## 2021-02-25 DIAGNOSIS — D599 Acquired hemolytic anemia, unspecified: Secondary | ICD-10-CM | POA: Diagnosis not present

## 2021-02-25 DIAGNOSIS — D631 Anemia in chronic kidney disease: Secondary | ICD-10-CM | POA: Diagnosis not present

## 2021-02-25 DIAGNOSIS — D508 Other iron deficiency anemias: Secondary | ICD-10-CM | POA: Diagnosis not present

## 2021-02-25 DIAGNOSIS — M1611 Unilateral primary osteoarthritis, right hip: Secondary | ICD-10-CM | POA: Diagnosis not present

## 2021-02-25 DIAGNOSIS — Z780 Asymptomatic menopausal state: Secondary | ICD-10-CM | POA: Diagnosis not present

## 2021-02-25 DIAGNOSIS — I1310 Hypertensive heart and chronic kidney disease without heart failure, with stage 1 through stage 4 chronic kidney disease, or unspecified chronic kidney disease: Secondary | ICD-10-CM | POA: Diagnosis not present

## 2021-02-25 DIAGNOSIS — I252 Old myocardial infarction: Secondary | ICD-10-CM | POA: Diagnosis not present

## 2021-02-25 DIAGNOSIS — Z96653 Presence of artificial knee joint, bilateral: Secondary | ICD-10-CM | POA: Diagnosis not present

## 2021-02-25 DIAGNOSIS — Z801 Family history of malignant neoplasm of trachea, bronchus and lung: Secondary | ICD-10-CM | POA: Diagnosis not present

## 2021-02-25 DIAGNOSIS — Z7982 Long term (current) use of aspirin: Secondary | ICD-10-CM | POA: Diagnosis not present

## 2021-02-25 DIAGNOSIS — Z803 Family history of malignant neoplasm of breast: Secondary | ICD-10-CM | POA: Diagnosis not present

## 2021-02-25 DIAGNOSIS — K922 Gastrointestinal hemorrhage, unspecified: Secondary | ICD-10-CM | POA: Diagnosis not present

## 2021-02-25 DIAGNOSIS — E1122 Type 2 diabetes mellitus with diabetic chronic kidney disease: Secondary | ICD-10-CM | POA: Diagnosis not present

## 2021-02-25 DIAGNOSIS — R911 Solitary pulmonary nodule: Secondary | ICD-10-CM | POA: Diagnosis not present

## 2021-02-25 DIAGNOSIS — Z79899 Other long term (current) drug therapy: Secondary | ICD-10-CM | POA: Diagnosis not present

## 2021-02-25 DIAGNOSIS — D509 Iron deficiency anemia, unspecified: Secondary | ICD-10-CM | POA: Diagnosis not present

## 2021-02-25 DIAGNOSIS — N1832 Chronic kidney disease, stage 3b: Secondary | ICD-10-CM | POA: Diagnosis not present

## 2021-03-09 DIAGNOSIS — Z20828 Contact with and (suspected) exposure to other viral communicable diseases: Secondary | ICD-10-CM | POA: Diagnosis not present

## 2021-03-24 DIAGNOSIS — Z4789 Encounter for other orthopedic aftercare: Secondary | ICD-10-CM | POA: Diagnosis not present

## 2021-03-24 DIAGNOSIS — Z471 Aftercare following joint replacement surgery: Secondary | ICD-10-CM | POA: Diagnosis not present

## 2021-04-15 DIAGNOSIS — N1832 Chronic kidney disease, stage 3b: Secondary | ICD-10-CM | POA: Diagnosis not present

## 2021-04-15 DIAGNOSIS — Z79631 Long term (current) use of antimetabolite agent: Secondary | ICD-10-CM | POA: Diagnosis not present

## 2021-04-15 DIAGNOSIS — D631 Anemia in chronic kidney disease: Secondary | ICD-10-CM | POA: Diagnosis not present

## 2021-04-15 DIAGNOSIS — N1831 Chronic kidney disease, stage 3a: Secondary | ICD-10-CM | POA: Diagnosis not present

## 2021-04-15 DIAGNOSIS — D528 Other folate deficiency anemias: Secondary | ICD-10-CM | POA: Diagnosis not present

## 2021-04-15 DIAGNOSIS — K760 Fatty (change of) liver, not elsewhere classified: Secondary | ICD-10-CM | POA: Diagnosis not present

## 2021-04-16 DIAGNOSIS — N3281 Overactive bladder: Secondary | ICD-10-CM | POA: Diagnosis not present

## 2021-04-16 DIAGNOSIS — Z1231 Encounter for screening mammogram for malignant neoplasm of breast: Secondary | ICD-10-CM | POA: Diagnosis not present

## 2021-04-16 DIAGNOSIS — Z01419 Encounter for gynecological examination (general) (routine) without abnormal findings: Secondary | ICD-10-CM | POA: Diagnosis not present

## 2021-04-22 DIAGNOSIS — D631 Anemia in chronic kidney disease: Secondary | ICD-10-CM | POA: Diagnosis not present

## 2021-04-22 DIAGNOSIS — N1832 Chronic kidney disease, stage 3b: Secondary | ICD-10-CM | POA: Diagnosis not present

## 2021-05-06 DIAGNOSIS — M069 Rheumatoid arthritis, unspecified: Secondary | ICD-10-CM | POA: Diagnosis not present

## 2021-05-06 DIAGNOSIS — N1831 Chronic kidney disease, stage 3a: Secondary | ICD-10-CM | POA: Diagnosis not present

## 2021-05-06 DIAGNOSIS — M25511 Pain in right shoulder: Secondary | ICD-10-CM | POA: Diagnosis not present

## 2021-05-06 DIAGNOSIS — M25551 Pain in right hip: Secondary | ICD-10-CM | POA: Diagnosis not present

## 2021-05-06 DIAGNOSIS — H8113 Benign paroxysmal vertigo, bilateral: Secondary | ICD-10-CM | POA: Diagnosis not present

## 2021-05-06 DIAGNOSIS — Z1331 Encounter for screening for depression: Secondary | ICD-10-CM | POA: Diagnosis not present

## 2021-05-06 DIAGNOSIS — D649 Anemia, unspecified: Secondary | ICD-10-CM | POA: Diagnosis not present

## 2021-05-06 DIAGNOSIS — Z0000 Encounter for general adult medical examination without abnormal findings: Secondary | ICD-10-CM | POA: Diagnosis not present

## 2021-05-06 DIAGNOSIS — D508 Other iron deficiency anemias: Secondary | ICD-10-CM | POA: Diagnosis not present

## 2021-05-06 DIAGNOSIS — E1121 Type 2 diabetes mellitus with diabetic nephropathy: Secondary | ICD-10-CM | POA: Diagnosis not present

## 2021-05-06 DIAGNOSIS — I10 Essential (primary) hypertension: Secondary | ICD-10-CM | POA: Diagnosis not present

## 2021-06-11 DIAGNOSIS — D631 Anemia in chronic kidney disease: Secondary | ICD-10-CM | POA: Diagnosis not present

## 2021-06-11 DIAGNOSIS — N1832 Chronic kidney disease, stage 3b: Secondary | ICD-10-CM | POA: Diagnosis not present

## 2021-06-17 DIAGNOSIS — J0191 Acute recurrent sinusitis, unspecified: Secondary | ICD-10-CM | POA: Diagnosis not present

## 2021-06-25 ENCOUNTER — Ambulatory Visit (INDEPENDENT_AMBULATORY_CARE_PROVIDER_SITE_OTHER): Payer: Medicare Other | Admitting: Cardiology

## 2021-06-25 ENCOUNTER — Encounter: Payer: Self-pay | Admitting: Cardiology

## 2021-06-25 VITALS — BP 116/52 | HR 69 | Ht 66.0 in | Wt 195.4 lb

## 2021-06-25 DIAGNOSIS — N1832 Chronic kidney disease, stage 3b: Secondary | ICD-10-CM

## 2021-06-25 DIAGNOSIS — I10 Essential (primary) hypertension: Secondary | ICD-10-CM | POA: Diagnosis not present

## 2021-06-25 DIAGNOSIS — I25119 Atherosclerotic heart disease of native coronary artery with unspecified angina pectoris: Secondary | ICD-10-CM | POA: Diagnosis not present

## 2021-06-25 MED ORDER — NITROGLYCERIN 0.4 MG SL SUBL: 0.4000 mg | SUBLINGUAL_TABLET | SUBLINGUAL | 3 refills | Status: DC | PRN

## 2021-06-25 NOTE — Patient Instructions (Signed)

## 2021-06-25 NOTE — Progress Notes (Signed)
? ? ?Cardiology Office Note ? ?Date: 06/25/2021  ? ?ID: Diana Patterson, DOB 17-Jan-1946, MRN [id number] ? ?PCP:  Neale Burly, MD  ?Cardiologist:  Rozann Lesches, MD ?Electrophysiologist:  None  ? ?Chief Complaint  ?Patient presents with  ? Cardiac follow-up  ? ? ?History of Present Illness: ?Diana Patterson is a 76 y.o. female last seen in April 2022.  She is here for a follow-up visit.  From a cardiac perspective, she is doing well without any significant angina symptoms, in fact still has an old bottle which needs to be refilled. ? ?She underwent successful right total hip arthroplasty in November 2022, no obvious perioperative cardiac complications.  I personally reviewed her ECG today which shows normal sinus rhythm. ? ?Cardiac regimen as noted below, no significant changes.  She is more mobile following her hip surgery. ? ?Past Medical History:  ?Diagnosis Date  ? Arthritis   ? CAD (coronary artery disease)   ? DES to circumflex June 2017, moderate residual LAD disease  ? CKD (chronic kidney disease) stage 3, GFR 30-59 ml/min (HCC)   ? Essential hypertension   ? Mixed hyperlipidemia   ? Muscle cramps at night   ? NSTEMI (non-ST elevated myocardial infarction) ([place]) 2016  ? Probable type II event, negative Cardiolite 2012  ? Sleep apnea   ? Stroke [facility])   ? tia  ? TIA (transient ischemic attack)   ? Type 2 diabetes mellitus ([place])   ? ? ?Past Surgical History:  ?Procedure Laterality Date  ? CARDIAC CATHETERIZATION N/A 08/23/2015  ? Procedure: Left Heart Cath and Coronary Angiography;  Surgeon: Leonie Man, MD;  Location: [place] [facility];  Service: Cardiovascular;  Laterality: N/A;  ? CARDIAC CATHETERIZATION N/A 08/23/2015  ? Procedure: Coronary Stent Intervention;  Surgeon: Leonie Man, MD;  Location: [place] [facility];  Service: Cardiovascular;  Laterality: N/A;  ? CHOLECYSTECTOMY    ? EYE SURGERY  1990s  ? GIVENS CAPSULE STUDY N/A 04/01/2020  ? Procedure: GIVENS CAPSULE STUDY;  Surgeon:  Harvel Quale, MD;  [facility];  Service: Gastroenterology;  Laterality: N/A;  7:30  ? KNEE ARTHROPLASTY Bilateral   ? TOTAL HIP ARTHROPLASTY Right 02/09/2021  ? Procedure: TOTAL HIP ARTHROPLASTY ANTERIOR APPROACH;  Surgeon: Gaynelle Arabian, MD;  [facility];  Service: Orthopedics;  Laterality: Right;  ? Tubal pregnancy removal    ? ? ?Current Outpatient Medications  ?Medication Sig Dispense Refill  ? acetaminophen (TYLENOL) 650 MG CR tablet Take 1,950 mg by mouth in the morning and at bedtime.    ? aspirin EC 81 MG tablet Take 81 mg by mouth daily. Swallow whole.    ? atorvastatin (LIPITOR) 80 MG tablet Take 1 tablet (80 mg total) by mouth daily at 6 PM. 30 tablet 5  ? Carboxymethylcellul-Glycerin (LUBRICATING EYE DROPS OP) Place 1 drop into both eyes daily as needed (dry eyes).    ? Choline Fenofibrate 135 MG capsule Take 135 mg by mouth daily in the afternoon.    ? clotrimazole-betamethasone (LOTRISONE) cream Apply 1 application topically daily.    ? Dexlansoprazole (DEXILANT) 30 MG capsule Take 30 mg by mouth daily with breakfast.    ? econazole nitrate 1 % cream Apply 1 application topically daily as needed (irritation).     ? folic acid (FOLVITE) 1 MG tablet Take 1 mg by mouth See admin instructions. Take 1 mg by mouth daily except for Saturday    ? furosemide (LASIX) 20 MG  tablet Take 20 mg by mouth daily with breakfast.    ? levocetirizine (XYZAL) 5 MG tablet Take 5 mg by mouth every evening.    ? meclizine (ANTIVERT) 25 MG tablet Take 25 mg by mouth every 6 (six) hours as needed for dizziness.    ? metaxalone (SKELAXIN) 800 MG tablet Take 800 mg by mouth 2 (two) times daily.     ? methocarbamol (ROBAXIN) 500 MG tablet Take 1 tablet (500 mg total) by mouth every 6 (six) hours as needed for muscle spasms. 40 tablet 0  ? methotrexate (RHEUMATREX) 2.5 MG tablet Take 20 mg by mouth every Saturday. Take 8 tablets on Saturday for '20mg'$  dose.    ? metoprolol tartrate (LOPRESSOR) 50 MG  tablet Take 25 mg by mouth daily.    ? montelukast (SINGULAIR) 10 MG tablet Take 10 mg by mouth daily in the afternoon.    ? nitroGLYCERIN (NITROSTAT) 0.4 MG SL tablet Place 1 tablet (0.4 mg total) under the tongue every 5 (five) minutes as needed for chest pain. 25 tablet 3  ? ondansetron (ZOFRAN-ODT) 4 MG disintegrating tablet Take 4 mg by mouth every 4 (four) hours as needed for nausea/vomiting.    ? Polysaccharide Iron Complex (POLY-IRON 150 PO) Take 150 mg by mouth at bedtime.    ? potassium chloride SA (KLOR-CON M) 20 MEQ tablet Take 20 mEq by mouth daily.    ? traMADol (ULTRAM) 50 MG tablet Take 1-2 tablets (50-100 mg total) by mouth every 6 (six) hours as needed for moderate pain. 40 tablet 0  ? valACYclovir (VALTREX) 500 MG tablet Take 500 mg by mouth daily.    ? ?No current facility-administered medications for this visit.  ? ?Allergies:  Morphine and related  ? ?ROS: No palpitations or syncope. ? ?Physical Exam: ?VS:  BP (!) 116/52   Pulse 69   Ht '5\' 6"'$  (1.676 m)   Wt 195 lb 6.4 oz (88.6 kg)   SpO2 96%   BMI 31.54 kg/m? , BMI Body mass index is 31.54 kg/m?. ? ?Wt Readings from Last 3 Encounters:  ?06/25/21 195 lb 6.4 oz (88.6 kg)  ?02/09/21 166 lb 7.2 oz (75.5 kg)  ?02/02/21 166 lb 7 oz (75.5 kg)  ?  ?General: Patient appears comfortable at rest. ?HEENT: Conjunctiva and lids normal. ?Neck: Supple, no elevated JVP or carotid bruits, no thyromegaly. ?Lungs: Clear to auscultation, nonlabored breathing at rest. ?Cardiac: Regular rate and rhythm, no S3, 1/6 systolic murmur, no pericardial rub. ?Extremities: No pitting edema. ? ?ECG:  An ECG dated 06/25/2020 was personally reviewed today and demonstrated:  Sinus rhythm. ? ?Recent Labwork: ?02/02/2021: ALT 29; AST 42 ?02/11/2021: BUN 21; Creatinine, Ser 0.99; Hemoglobin 7.7; Platelets 136; Potassium 4.2; Sodium 135  ?March 2023: Hemoglobin 12.0, platelets 244, potassium 5.3, BUN 29, creatinine 1.52, AST 41, ALT 40 ? ?Other Studies Reviewed Today: ? ?Cardiac  catheterization 08/23/2015: ?Prox Cx to Mid Cx lesion, 90% stenosed. Post intervention aspiration thrombectomy followed by Promus Premier DES 3.5 mm x 12 mm (3.75 mm) , there is a 0% residual stenosis. ?Prox LAD-2 lesion, 50-75% stenosed. ?The left ventricular systolic function is normal. ?Elevated LVEDP ?  ?Likely culprit lesion was the thrombotic stenosis of the circumflex. It would appear that there was reperfusion after heparin and aspirin was given in the emergency room. This would be consistent with her improved chest pain upon arrival. ?  ?Successful PCI of the proximal dominant Circumflex. ?There is residual disease in the proximal-mid LAD prior to  D1. This does not appear to be flow-limiting, however would probably benefit from being investigated. ?  ?Echocardiogram 08/25/2015: ?- Left ventricle: The cavity size was normal. Wall thickness was  ?  increased in a pattern of mild LVH. Systolic function was normal.  ?  The estimated ejection fraction was in the range of 60% to 65%.  ?  Wall motion was normal; there were no regional wall motion  ?  abnormalities. Doppler parameters are consistent with abnormal  ?  left ventricular relaxation (grade 1 diastolic dysfunction).  ?- Mitral valve: Mildly calcified annulus.  ? ?Assessment and Plan: ? ?1.  CAD status post DES to the circumflex in 2017 with moderate residual LAD disease.  She is doing well without active angina symptoms.  Refill provided for fresh bottle of nitroglycerin.  ECG is normal today.  Continue aspirin, Lopressor, and Lipitor. ? ?2.  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 ? ?3.  Essential hypertension, blood pressure is well controlled today.  No changes were made. ? ?Medication Adjustments/Labs and Tests Ordered: ?Current medicines are reviewed at length with the patient today.  Concerns regarding medicines are outlined above.  ? ?Tests Ordered: ?Orders Placed This Encounter  ?Procedures  ? EKG 12-Lead  ? ? ?Medication Changes: ?No orders of the  defined types were placed in this encounter. ? ? ?Disposition:  Follow up  6 months. ? ?Signed, ?Satira Sark, MD, [facility] ?06/25/2021 1:11 PM    ?[place] at [facility] ?[address]

## 2021-07-13 DIAGNOSIS — H35033 Hypertensive retinopathy, bilateral: Secondary | ICD-10-CM | POA: Diagnosis not present

## 2021-07-21 ENCOUNTER — Ambulatory Visit: Payer: Medicare Other | Admitting: Cardiology

## 2021-07-21 DIAGNOSIS — B952 Enterococcus as the cause of diseases classified elsewhere: Secondary | ICD-10-CM | POA: Diagnosis not present

## 2021-07-21 DIAGNOSIS — N390 Urinary tract infection, site not specified: Secondary | ICD-10-CM | POA: Diagnosis not present

## 2021-08-05 DIAGNOSIS — I10 Essential (primary) hypertension: Secondary | ICD-10-CM | POA: Diagnosis not present

## 2021-08-05 DIAGNOSIS — N1831 Chronic kidney disease, stage 3a: Secondary | ICD-10-CM | POA: Diagnosis not present

## 2021-08-05 DIAGNOSIS — E1121 Type 2 diabetes mellitus with diabetic nephropathy: Secondary | ICD-10-CM | POA: Diagnosis not present

## 2021-08-05 DIAGNOSIS — M545 Low back pain, unspecified: Secondary | ICD-10-CM | POA: Diagnosis not present

## 2021-08-05 DIAGNOSIS — J0191 Acute recurrent sinusitis, unspecified: Secondary | ICD-10-CM | POA: Diagnosis not present

## 2021-08-26 DIAGNOSIS — N1832 Chronic kidney disease, stage 3b: Secondary | ICD-10-CM | POA: Diagnosis not present

## 2021-08-26 DIAGNOSIS — D631 Anemia in chronic kidney disease: Secondary | ICD-10-CM | POA: Diagnosis not present

## 2021-08-26 DIAGNOSIS — D599 Acquired hemolytic anemia, unspecified: Secondary | ICD-10-CM | POA: Diagnosis not present

## 2021-09-02 DIAGNOSIS — N1831 Chronic kidney disease, stage 3a: Secondary | ICD-10-CM | POA: Diagnosis not present

## 2021-09-02 DIAGNOSIS — D631 Anemia in chronic kidney disease: Secondary | ICD-10-CM | POA: Diagnosis not present

## 2021-09-02 DIAGNOSIS — D599 Acquired hemolytic anemia, unspecified: Secondary | ICD-10-CM | POA: Diagnosis not present

## 2021-09-02 DIAGNOSIS — D573 Sickle-cell trait: Secondary | ICD-10-CM | POA: Diagnosis not present

## 2021-09-02 DIAGNOSIS — N1832 Chronic kidney disease, stage 3b: Secondary | ICD-10-CM | POA: Diagnosis not present

## 2021-09-29 DIAGNOSIS — T675XXA Heat exhaustion, unspecified, initial encounter: Secondary | ICD-10-CM | POA: Diagnosis not present

## 2021-09-29 DIAGNOSIS — M19111 Post-traumatic osteoarthritis, right shoulder: Secondary | ICD-10-CM | POA: Diagnosis not present

## 2021-11-11 DIAGNOSIS — I10 Essential (primary) hypertension: Secondary | ICD-10-CM | POA: Diagnosis not present

## 2021-11-11 DIAGNOSIS — E1121 Type 2 diabetes mellitus with diabetic nephropathy: Secondary | ICD-10-CM | POA: Diagnosis not present

## 2021-11-11 DIAGNOSIS — T675XXA Heat exhaustion, unspecified, initial encounter: Secondary | ICD-10-CM | POA: Diagnosis not present

## 2021-11-11 DIAGNOSIS — N1831 Chronic kidney disease, stage 3a: Secondary | ICD-10-CM | POA: Diagnosis not present

## 2021-11-11 DIAGNOSIS — M19111 Post-traumatic osteoarthritis, right shoulder: Secondary | ICD-10-CM | POA: Diagnosis not present

## 2021-11-11 DIAGNOSIS — I2510 Atherosclerotic heart disease of native coronary artery without angina pectoris: Secondary | ICD-10-CM | POA: Diagnosis not present

## 2021-11-11 DIAGNOSIS — E7849 Other hyperlipidemia: Secondary | ICD-10-CM | POA: Diagnosis not present

## 2021-11-16 DIAGNOSIS — Z23 Encounter for immunization: Secondary | ICD-10-CM | POA: Diagnosis not present

## 2021-12-04 DIAGNOSIS — Z23 Encounter for immunization: Secondary | ICD-10-CM | POA: Diagnosis not present

## 2021-12-28 ENCOUNTER — Ambulatory Visit: Payer: Medicare Other | Attending: Cardiology | Admitting: Cardiology

## 2021-12-28 ENCOUNTER — Encounter: Payer: Self-pay | Admitting: Cardiology

## 2021-12-28 VITALS — BP 148/68 | HR 70 | Ht 65.0 in | Wt 207.2 lb

## 2021-12-28 DIAGNOSIS — I10 Essential (primary) hypertension: Secondary | ICD-10-CM | POA: Insufficient documentation

## 2021-12-28 DIAGNOSIS — I25119 Atherosclerotic heart disease of native coronary artery with unspecified angina pectoris: Secondary | ICD-10-CM | POA: Insufficient documentation

## 2021-12-28 NOTE — Patient Instructions (Signed)
Medication Instructions:  Continue all current medications.   Labwork: none  Testing/Procedures: none  Follow-Up: 6 months   Any Other Special Instructions Will Be Listed Below (If Applicable).   If you need a refill on your cardiac medications before your next appointment, please call your pharmacy.  

## 2021-12-28 NOTE — Progress Notes (Signed)
Cardiology Office Note  Date: 12/28/2021   ID: Mitzy, Naron [birth year], MRN [id number]  PCP:  Neale Burly, MD  Cardiologist:  Rozann Lesches, MD Electrophysiologist:  None   Chief Complaint  Patient presents with   Cardiac follow-up    History of Present Illness: Diana Patterson is a 76 y.o. female last seen in April.  She is here for a follow-up visit.  Reports no angina or nitroglycerin use.  She goes to the senior center here in [place] 4 days a week for 2 hours at a time.  Has travel plans with her husband this fall as well.  Reports NYHA class II dyspnea, no palpitations or syncope.  I reviewed her medications which are outlined below.  She continues to follow with Dr. Sherrie Sport primary care.  Past Medical History:  Diagnosis Date   Arthritis    CAD (coronary artery disease)    DES to circumflex June 2017, moderate residual LAD disease   CKD (chronic kidney disease) stage 3, GFR 30-59 ml/min (HCC)    Essential hypertension    Mixed hyperlipidemia    Muscle cramps at night    NSTEMI (non-ST elevated myocardial infarction) ([place]) 2016   Probable type II event, negative Cardiolite 2012   Sleep apnea    Stroke ([place])    tia   TIA (transient ischemic attack)    Type 2 diabetes mellitus ([place])     Past Surgical History:  Procedure Laterality Date   CARDIAC CATHETERIZATION N/A 08/23/2015   Procedure: Left Heart Cath and Coronary Angiography;  Surgeon: Leonie Man, MD;  Location: [place] [facility];  Service: Cardiovascular;  Laterality: N/A;   CARDIAC CATHETERIZATION N/A 08/23/2015   Procedure: Coronary Stent Intervention;  Surgeon: Leonie Man, MD;  Location: [place] [facility];  Service: Cardiovascular;  Laterality: N/A;   CHOLECYSTECTOMY     EYE SURGERY  1990s   GIVENS CAPSULE STUDY N/A 04/01/2020   Procedure: GIVENS CAPSULE STUDY;  Surgeon: Harvel Quale, MD;  [facility];  Service: Gastroenterology;  Laterality: N/A;   7:30   KNEE ARTHROPLASTY Bilateral    TOTAL HIP ARTHROPLASTY Right 02/09/2021   Procedure: TOTAL HIP ARTHROPLASTY ANTERIOR APPROACH;  Surgeon: Gaynelle Arabian, MD;  [facility];  Service: Orthopedics;  Laterality: Right;   Tubal pregnancy removal      Current Outpatient Medications  Medication Sig Dispense Refill   acetaminophen (TYLENOL) 650 MG CR tablet Take 1,950 mg by mouth in the morning and at bedtime.     aspirin EC 81 MG tablet Take 81 mg by mouth daily. Swallow whole.     atorvastatin (LIPITOR) 80 MG tablet Take 1 tablet (80 mg total) by mouth daily at 6 PM. 30 tablet 5   Carboxymethylcellul-Glycerin (LUBRICATING EYE DROPS OP) Place 1 drop into both eyes daily as needed (dry eyes).     Choline Fenofibrate 135 MG capsule Take 135 mg by mouth daily in the afternoon.     clotrimazole-betamethasone (LOTRISONE) cream Apply 1 application topically daily.     Dexlansoprazole (DEXILANT) 30 MG capsule Take 30 mg by mouth daily with breakfast.     econazole nitrate 1 % cream Apply 1 application topically daily as needed (irritation).      folic acid (FOLVITE) 1 MG tablet Take 1 mg by mouth See admin instructions. Take 1 mg by mouth daily except for Saturday     furosemide (LASIX) 20 MG tablet Take 20 mg by mouth  daily with breakfast.     levocetirizine (XYZAL) 5 MG tablet Take 5 mg by mouth every evening.     meclizine (ANTIVERT) 25 MG tablet Take 25 mg by mouth every 6 (six) hours as needed for dizziness.     metaxalone (SKELAXIN) 800 MG tablet Take 800 mg by mouth 2 (two) times daily.      methocarbamol (ROBAXIN) 500 MG tablet Take 1 tablet (500 mg total) by mouth every 6 (six) hours as needed for muscle spasms. 40 tablet 0   methotrexate (RHEUMATREX) 2.5 MG tablet Take 20 mg by mouth every Saturday. Take 8 tablets on Saturday for '20mg'$  dose.     metoprolol tartrate (LOPRESSOR) 50 MG tablet Take 25 mg by mouth daily.     montelukast (SINGULAIR) 10 MG tablet Take 10 mg by mouth daily in  the afternoon.     nitroGLYCERIN (NITROSTAT) 0.4 MG SL tablet Place 1 tablet (0.4 mg total) under the tongue every 5 (five) minutes x 3 doses as needed for chest pain (if no relief after 2nd dose, proceed to ED or call 911). 25 tablet 3   ondansetron (ZOFRAN-ODT) 4 MG disintegrating tablet Take 4 mg by mouth every 4 (four) hours as needed for nausea/vomiting.     Polysaccharide Iron Complex (POLY-IRON 150 PO) Take 150 mg by mouth at bedtime.     potassium chloride SA (KLOR-CON M) 20 MEQ tablet Take 20 mEq by mouth daily.     traMADol (ULTRAM) 50 MG tablet Take 1-2 tablets (50-100 mg total) by mouth every 6 (six) hours as needed for moderate pain. 40 tablet 0   valACYclovir (VALTREX) 500 MG tablet Take 500 mg by mouth daily.     No current facility-administered medications for this visit.   Allergies:  Morphine and related   ROS: No orthopnea or PND.  Physical Exam: VS:  BP (!) 148/68   Pulse 70   Ht '5\' 5"'$  (1.651 m)   Wt 207 lb 3.2 oz (94 kg)   SpO2 98%   BMI 34.48 kg/m , BMI Body mass index is 34.48 kg/m.  Wt Readings from Last 3 Encounters:  12/28/21 207 lb 3.2 oz (94 kg)  06/25/21 195 lb 6.4 oz (88.6 kg)  02/09/21 166 lb 7.2 oz (75.5 kg)    General: Patient appears comfortable at rest. HEENT: Conjunctiva and lids normal. Neck: Supple, no elevated JVP or carotid bruits. Lungs: Clear to auscultation, nonlabored breathing at rest. Cardiac: Regular rate and rhythm, no S3, 1/6 systolic murmur, no pericardial rub. Abdomen: Soft, nontender, bowel sounds present. Extremities: No pitting edema.  ECG:  An ECG dated 06/25/2021 was personally reviewed today and demonstrated:  Sinus rhythm.  Recent Labwork: 02/02/2021: ALT 29; AST 42 02/11/2021: BUN 21; Creatinine, Ser 0.99; Hemoglobin 7.7; Platelets 136; Potassium 4.2; Sodium 135  June 2023: Hemoglobin 10.2, platelets 200, potassium 4.1, BUN 20, creatinine 1.24, AST 47, ALT 42  Other Studies Reviewed Today:  Cardiac catheterization  08/23/2015: Prox Cx to Mid Cx lesion, 90% stenosed. Post intervention aspiration thrombectomy followed by Promus Premier DES 3.5 mm x 12 mm (3.75 mm) , there is a 0% residual stenosis. Prox LAD-2 lesion, 50-75% stenosed. The left ventricular systolic function is normal. Elevated LVEDP   Likely culprit lesion was the thrombotic stenosis of the circumflex. It would appear that there was reperfusion after heparin and aspirin was given in the emergency room. This would be consistent with her improved chest pain upon arrival.   Successful PCI of the  proximal dominant Circumflex. There is residual disease in the proximal-mid LAD prior to D1. This does not appear to be flow-limiting, however would probably benefit from being investigated.   Echocardiogram 08/25/2015: - Left ventricle: The cavity size was normal. Wall thickness was    increased in a pattern of mild LVH. Systolic function was normal.    The estimated ejection fraction was in the range of 60% to 65%.    Wall motion was normal; there were no regional wall motion    abnormalities. Doppler parameters are consistent with abnormal    left ventricular relaxation (grade 1 diastolic dysfunction).  - Mitral valve: Mildly calcified annulus.   Assessment and Plan:  1.  CAD status post DES to the circumflex in 2017 with moderate residual LAD disease being managed medically.  She continues to report no angina on medical therapy.  No nitroglycerin use.  Continue aspirin, Lipitor, Lopressor, and as needed nitroglycerin.  2.  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  3.  Essential hypertension, systolic in the 151V today on current regimen which also includes Lasix with potassium supplement.  Keep follow-up with PCP.  Medication Adjustments/Labs and Tests Ordered: Current medicines are reviewed at length with the patient today.  Concerns regarding medicines are outlined above.   Tests Ordered: No orders of the defined types were placed  in this encounter.   Medication Changes: No orders of the defined types were placed in this encounter.   Disposition:  Follow up  6 months.  Signed, Satira Sark, MD, [facility] 12/28/2021 2:18 PM    [facility] at [place], [place], [place] [address] Phone: [phone]; Fax: [phone]

## 2022-01-12 DIAGNOSIS — E7849 Other hyperlipidemia: Secondary | ICD-10-CM | POA: Diagnosis not present

## 2022-01-12 DIAGNOSIS — Z0000 Encounter for general adult medical examination without abnormal findings: Secondary | ICD-10-CM | POA: Diagnosis not present

## 2022-01-12 DIAGNOSIS — I10 Essential (primary) hypertension: Secondary | ICD-10-CM | POA: Diagnosis not present

## 2022-01-12 DIAGNOSIS — I2510 Atherosclerotic heart disease of native coronary artery without angina pectoris: Secondary | ICD-10-CM | POA: Diagnosis not present

## 2022-01-12 DIAGNOSIS — N1831 Chronic kidney disease, stage 3a: Secondary | ICD-10-CM | POA: Diagnosis not present

## 2022-01-12 DIAGNOSIS — E1121 Type 2 diabetes mellitus with diabetic nephropathy: Secondary | ICD-10-CM | POA: Diagnosis not present

## 2022-01-12 DIAGNOSIS — D649 Anemia, unspecified: Secondary | ICD-10-CM | POA: Diagnosis not present

## 2022-01-12 DIAGNOSIS — Z1331 Encounter for screening for depression: Secondary | ICD-10-CM | POA: Diagnosis not present

## 2022-01-20 DIAGNOSIS — Z780 Asymptomatic menopausal state: Secondary | ICD-10-CM | POA: Diagnosis not present

## 2022-01-20 DIAGNOSIS — M810 Age-related osteoporosis without current pathological fracture: Secondary | ICD-10-CM | POA: Diagnosis not present

## 2022-03-01 DIAGNOSIS — D631 Anemia in chronic kidney disease: Secondary | ICD-10-CM | POA: Diagnosis not present

## 2022-03-01 DIAGNOSIS — N1832 Chronic kidney disease, stage 3b: Secondary | ICD-10-CM | POA: Diagnosis not present

## 2022-03-08 DIAGNOSIS — D631 Anemia in chronic kidney disease: Secondary | ICD-10-CM | POA: Diagnosis not present

## 2022-03-08 DIAGNOSIS — N1832 Chronic kidney disease, stage 3b: Secondary | ICD-10-CM | POA: Diagnosis not present

## 2022-04-14 DIAGNOSIS — Z1331 Encounter for screening for depression: Secondary | ICD-10-CM | POA: Diagnosis not present

## 2022-04-14 DIAGNOSIS — E7849 Other hyperlipidemia: Secondary | ICD-10-CM | POA: Diagnosis not present

## 2022-04-14 DIAGNOSIS — I2510 Atherosclerotic heart disease of native coronary artery without angina pectoris: Secondary | ICD-10-CM | POA: Diagnosis not present

## 2022-04-14 DIAGNOSIS — N1831 Chronic kidney disease, stage 3a: Secondary | ICD-10-CM | POA: Diagnosis not present

## 2022-04-14 DIAGNOSIS — E1121 Type 2 diabetes mellitus with diabetic nephropathy: Secondary | ICD-10-CM | POA: Diagnosis not present

## 2022-04-14 DIAGNOSIS — M19011 Primary osteoarthritis, right shoulder: Secondary | ICD-10-CM | POA: Diagnosis not present

## 2022-04-14 DIAGNOSIS — I10 Essential (primary) hypertension: Secondary | ICD-10-CM | POA: Diagnosis not present

## 2022-04-14 DIAGNOSIS — Z0000 Encounter for general adult medical examination without abnormal findings: Secondary | ICD-10-CM | POA: Diagnosis not present

## 2022-04-19 DIAGNOSIS — Z1231 Encounter for screening mammogram for malignant neoplasm of breast: Secondary | ICD-10-CM | POA: Diagnosis not present

## 2022-04-26 DIAGNOSIS — R928 Other abnormal and inconclusive findings on diagnostic imaging of breast: Secondary | ICD-10-CM | POA: Diagnosis not present

## 2022-05-19 DIAGNOSIS — Z96651 Presence of right artificial knee joint: Secondary | ICD-10-CM | POA: Diagnosis not present

## 2022-05-19 DIAGNOSIS — Z96652 Presence of left artificial knee joint: Secondary | ICD-10-CM | POA: Diagnosis not present

## 2022-05-19 DIAGNOSIS — Z96641 Presence of right artificial hip joint: Secondary | ICD-10-CM | POA: Diagnosis not present

## 2022-05-19 DIAGNOSIS — M25552 Pain in left hip: Secondary | ICD-10-CM | POA: Diagnosis not present

## 2022-05-19 DIAGNOSIS — M545 Low back pain, unspecified: Secondary | ICD-10-CM | POA: Diagnosis not present

## 2022-05-24 DIAGNOSIS — S43491A Other sprain of right shoulder joint, initial encounter: Secondary | ICD-10-CM | POA: Diagnosis not present

## 2022-06-16 DIAGNOSIS — M25552 Pain in left hip: Secondary | ICD-10-CM | POA: Diagnosis not present

## 2022-06-28 DIAGNOSIS — H81399 Other peripheral vertigo, unspecified ear: Secondary | ICD-10-CM | POA: Diagnosis not present

## 2022-06-28 DIAGNOSIS — I10 Essential (primary) hypertension: Secondary | ICD-10-CM | POA: Diagnosis not present

## 2022-06-30 DIAGNOSIS — H35033 Hypertensive retinopathy, bilateral: Secondary | ICD-10-CM | POA: Diagnosis not present

## 2022-07-02 ENCOUNTER — Encounter: Payer: Self-pay | Admitting: Cardiology

## 2022-07-02 ENCOUNTER — Ambulatory Visit: Payer: Medicare Other | Attending: Cardiology | Admitting: Cardiology

## 2022-07-02 VITALS — BP 138/70 | HR 68 | Ht 66.0 in | Wt 201.2 lb

## 2022-07-02 DIAGNOSIS — E782 Mixed hyperlipidemia: Secondary | ICD-10-CM

## 2022-07-02 DIAGNOSIS — I25119 Atherosclerotic heart disease of native coronary artery with unspecified angina pectoris: Secondary | ICD-10-CM | POA: Insufficient documentation

## 2022-07-02 DIAGNOSIS — N1832 Chronic kidney disease, stage 3b: Secondary | ICD-10-CM

## 2022-07-02 DIAGNOSIS — I10 Essential (primary) hypertension: Secondary | ICD-10-CM

## 2022-07-02 NOTE — Patient Instructions (Addendum)

## 2022-07-02 NOTE — Progress Notes (Signed)
    Cardiology Office Note  Date: 07/02/2022   ID: Emarie Zieger, DOB 1945-11-03, MRN [id number]  History of Present Illness: Diana Patterson is a 77 y.o. female last seen in October 2023.  She is here for a routine visit.  Reports no angina or nitroglycerin use, stable NYHA class II dyspnea.  She has had some limitations related to the left hip pain, but better with pain control injections.  I reviewed her medications, stable from a cardiac perspective.  She has done well on high-dose Lipitor, last LDL was 54 in August 2023.  ECG today shows normal sinus rhythm.  Physical Exam: VS:  BP 138/70   Pulse 68   Ht 5\' 6"  (1.676 m)   Wt 201 lb 3.2 oz (91.3 kg)   SpO2 96%   BMI 32.47 kg/m , BMI Body mass index is 32.47 kg/m.  Wt Readings from Last 3 Encounters:  07/02/22 201 lb 3.2 oz (91.3 kg)  12/28/21 207 lb 3.2 oz (94 kg)  06/25/21 195 lb 6.4 oz (88.6 kg)    General: Patient appears comfortable at rest. HEENT: Conjunctiva and lids normal. Neck: Supple, no elevated JVP or carotid bruits. Lungs: Clear to auscultation, nonlabored breathing at rest. Cardiac: Regular rate and rhythm, no S3, 1/6 systolic murmur. Extremities: No pitting edema.  ECG:  An ECG dated 06/25/2021 was personally reviewed today and demonstrated:  Sinus rhythm.  Labwork:  August 2023: Cholesterol 113, triglycerides 72, HDL 44, LDL 54 December 2023: Hemoglobin 10.7, platelets 183, potassium 4.7, BUN 13, creatinine 1.24  Other Studies Reviewed Today:  No interval cardiac testing for review today.  Assessment and Plan:  1.  CAD status post DES to the circumflex in 2017 with moderate residual LAD disease managed medically.  LVEF 60 to 65% by last assessment in 2017.  She does not report any angina with typical activity, has done very well on medical therapy and plan is to continue observation in the absence of new cardiac symptoms.  ECG is normal today.  Continue aspirin, Lipitor, Lopressor, and  as needed nitroglycerin.  2.  Essential hypertension.  No change in current regimen.  Keep follow-up with PCP.  3.  Mixed hyperlipidemia.  Tolerating high-dose Crestor with LDL 54 in August 2023.  4.  CKD stage IIIb.  Last creatinine 1.24 in December 2023.  Disposition:  Follow up  6 months.  Signed, Jonelle Sidle, M.D., F.A.C.C. [facility] at [facility]

## 2022-09-15 DIAGNOSIS — M25552 Pain in left hip: Secondary | ICD-10-CM | POA: Diagnosis not present

## 2022-09-27 DIAGNOSIS — H81399 Other peripheral vertigo, unspecified ear: Secondary | ICD-10-CM | POA: Diagnosis not present

## 2022-09-27 DIAGNOSIS — I10 Essential (primary) hypertension: Secondary | ICD-10-CM | POA: Diagnosis not present

## 2022-09-27 DIAGNOSIS — N3091 Cystitis, unspecified with hematuria: Secondary | ICD-10-CM | POA: Diagnosis not present

## 2022-09-27 DIAGNOSIS — D692 Other nonthrombocytopenic purpura: Secondary | ICD-10-CM | POA: Diagnosis not present

## 2022-09-27 DIAGNOSIS — N1831 Chronic kidney disease, stage 3a: Secondary | ICD-10-CM | POA: Diagnosis not present

## 2022-09-27 DIAGNOSIS — E1121 Type 2 diabetes mellitus with diabetic nephropathy: Secondary | ICD-10-CM | POA: Diagnosis not present

## 2022-10-01 ENCOUNTER — Telehealth: Payer: Self-pay | Admitting: *Deleted

## 2022-10-01 NOTE — Telephone Encounter (Signed)
   Name: Diana Patterson  DOB: [birth year]  MRN: [id number]  Primary Cardiologist: Nona Dell, MD  Chart reviewed as part of pre-operative protocol coverage. Because of Diana Patterson's past medical history and time since last visit, she will require a follow-up telephone visit in order to better assess preoperative cardiovascular risk.  Pre-op covering staff: - Please schedule appointment and call patient to inform them. If patient already had an upcoming appointment within acceptable timeframe, please add "pre-op clearance" to the appointment notes so provider is aware. - Please contact requesting surgeon's office via preferred method (i.e, phone, fax) to inform them of need for appointment prior to surgery.  As long as patient is asymptomatic at the time of telephone call can hold ASA x 5 to 7 days prior to procedure and restart when medically safe to do so.  Sharlene Dory, PA-C  10/01/2022, 10:21 AM

## 2022-10-01 NOTE — Telephone Encounter (Signed)
I left voicemail for pt to return call and schedule tele appt for preop clearance.

## 2022-10-01 NOTE — Telephone Encounter (Signed)
Ok per pre op APP to see Dr. Diona Browner 01/04/23 for pre op clearance. I will update all parties involved.

## 2022-10-01 NOTE — Telephone Encounter (Signed)
   [facility] Pre-operative Risk Assessment    Patient Name: Diana Patterson  DOB: [birth year] MRN: [id number]  HEARTCARE STAFF:  - IMPORTANT!!!!!! Under Visit Info/Reason for Call, type in Other and utilize the format Clearance MM/DD/YY or Clearance TBD. Do not use dashes or single digits. - Please review there is not already an duplicate clearance open for this procedure. - If request is for dental extraction, please clarify the # of teeth to be extracted. - If the patient is currently at the dentist's office, call Pre-Op Callback Staff (MA/nurse) to input urgent request.  - If the patient is not currently in the dentist office, please route to the Pre-Op pool.  Request for surgical clearance:  What type of surgery is being performed? Lt total hip arthroplasty  When is this surgery scheduled? 01/10/23  What type of clearance is required (medical clearance vs. Pharmacy clearance to hold med vs. Both)? Both  Are there any medications that need to be held prior to surgery and how long? Asa 2  Practice name and name of physician performing surgery? [facility], Dr Lequita Halt  What is the office phone number? [phone]    7.   What is the office fax number? [phone] attn:Kelly Hancock  8.   Anesthesia type (None, local, MAC, general) ? Choice   Valrie Hart 10/01/2022, 9:08 AM  _________________________________________________________________   (provider comments below)

## 2022-10-22 NOTE — Telephone Encounter (Signed)
Patient returned Pre-op call. 

## 2022-10-22 NOTE — Telephone Encounter (Signed)
S/w pt is aware to keep appt with Dr. Diona Browner for pre op in October.  Will remove from pre op pool.

## 2022-11-04 DIAGNOSIS — R928 Other abnormal and inconclusive findings on diagnostic imaging of breast: Secondary | ICD-10-CM | POA: Diagnosis not present

## 2022-11-23 ENCOUNTER — Ambulatory Visit: Payer: Medicare Other | Attending: Cardiology | Admitting: Cardiology

## 2022-11-23 ENCOUNTER — Encounter: Payer: Self-pay | Admitting: Cardiology

## 2022-11-23 VITALS — BP 116/68 | HR 65 | Ht 65.0 in | Wt 205.2 lb

## 2022-11-23 DIAGNOSIS — I10 Essential (primary) hypertension: Secondary | ICD-10-CM | POA: Diagnosis not present

## 2022-11-23 DIAGNOSIS — I25119 Atherosclerotic heart disease of native coronary artery with unspecified angina pectoris: Secondary | ICD-10-CM | POA: Insufficient documentation

## 2022-11-23 DIAGNOSIS — E782 Mixed hyperlipidemia: Secondary | ICD-10-CM | POA: Insufficient documentation

## 2022-11-23 DIAGNOSIS — Z01810 Encounter for preprocedural cardiovascular examination: Secondary | ICD-10-CM | POA: Insufficient documentation

## 2022-11-23 DIAGNOSIS — N1832 Chronic kidney disease, stage 3b: Secondary | ICD-10-CM | POA: Diagnosis not present

## 2022-11-23 NOTE — Progress Notes (Signed)
    Cardiology Office Note  Date: 11/23/2022   ID: Diana Patterson, DOB 13-Jan-1946, MRN [id number]  History of Present Illness: Diana Patterson is a 77 y.o. female last seen in April.  She is here for a follow-up visit and preoperative cardiac evaluation. She is scheduled for left hip total arthroplasty under general anesthesia with Dr. Lequita Halt in October.  RCRI perioperative cardiac risk index is class III, 6.6% chance of major adverse cardiac event.  She does not describe any angina or nitroglycerin use in the interim and remains on stable cardiac regimen.  Stable NYHA class I-II dyspnea, no palpitations or syncope.  I reviewed her medications.  It was requested to hold aspirin prior to her hip surgery.  Creatinine was 1.32 in July and LDL 65 at that time.  Physical Exam: VS:  BP 116/68   Pulse 65   Ht 5\' 5"  (1.651 m)   Wt 205 lb 3.2 oz (93.1 kg)   SpO2 98%   BMI 34.15 kg/m , BMI Body mass index is 34.15 kg/m.  Wt Readings from Last 3 Encounters:  11/23/22 205 lb 3.2 oz (93.1 kg)  07/02/22 201 lb 3.2 oz (91.3 kg)  12/28/21 207 lb 3.2 oz (94 kg)    General: Patient appears comfortable at rest. HEENT: Conjunctiva and lids normal. Neck: Supple, no elevated JVP or carotid bruits. Lungs: Clear to auscultation, nonlabored breathing at rest. Cardiac: Regular rate and rhythm, no S3, 1/6 systolic murmur.  ECG:  An ECG dated 07/02/2022 was personally reviewed today and demonstrated:  Normal sinus rhythm.  Labwork:  July 2024: BUN 19, creatinine 1.32, potassium 5.8, cholesterol 137, triglycerides 72, HDL 58, LDL 65, hemoglobin A1c 5.4%  Other Studies Reviewed Today:  No interval cardiac testing for review today.  Assessment and Plan:  1.  CAD status post DES to the circumflex in 2017 with moderate residual LAD disease managed medically.  LVEF 60 to 65% by last assessment in 2017.  She reports no angina or nitroglycerin use.  Continue medical therapy including aspirin,  Lipitor, Lopressor, and as needed nitroglycerin.  2.  Preoperative cardiac assessment.  Overall intermediate risk with RCRI cardiac risk index class III, 6.6% chance of major adverse cardiac event.  In the absence of worsening angina or heart failure symptoms, no further cardiac testing is indicated at this time.  She is clinically stable on medical therapy, may hold aspirin 7 days prior to operation.   3.  Essential hypertension.  Continue medical therapy, blood pressure is normal today.   4.  Mixed hyperlipidemia.  LDL 65 in July.  She continues on Crestor.   5.  CKD stage IIIb.  Last creatinine 1.32 in July.  Disposition:  Follow up  6 months.  Signed, Jonelle Sidle, M.D., F.A.C.C. [facility]

## 2022-11-23 NOTE — Patient Instructions (Addendum)

## 2022-12-02 DIAGNOSIS — N1831 Chronic kidney disease, stage 3a: Secondary | ICD-10-CM | POA: Diagnosis not present

## 2022-12-02 DIAGNOSIS — E1121 Type 2 diabetes mellitus with diabetic nephropathy: Secondary | ICD-10-CM | POA: Diagnosis not present

## 2022-12-02 DIAGNOSIS — I10 Essential (primary) hypertension: Secondary | ICD-10-CM | POA: Diagnosis not present

## 2022-12-02 DIAGNOSIS — H81399 Other peripheral vertigo, unspecified ear: Secondary | ICD-10-CM | POA: Diagnosis not present

## 2022-12-20 NOTE — H&P (Signed)
TOTAL HIP ADMISSION H&P  Patient is admitted for left total hip arthroplasty.  Subjective:  Chief Complaint: Left hip pain  HPI: Diana Patterson, 77 y.o. female, has a history of pain and functional disability in the left hip due to arthritis and patient has failed non-surgical conservative treatments for greater than 12 weeks to include NSAID's and/or analgesics, corticosteriod injections, and activity modification. Onset of symptoms was gradual, starting  several  years ago with rapidlly worsening course since that time. The patient noted no past surgery on the left hip. Patient currently rates pain in the left hip at 9 out of 10 with activity. Patient has night pain, worsening of pain with activity and weight bearing, pain that interfers with activities of daily living, and pain with passive range of motion. Patient has evidence of subchondral sclerosis, periarticular osteophytes, and joint space narrowing by imaging studies. This condition presents safety issues increasing the risk of falls. There is no current active infection.  Patient Active Problem List   Diagnosis Date Noted   OA (osteoarthritis) of hip 02/09/2021   Primary osteoarthritis of right hip 02/09/2021   Iron deficiency anemia 01/10/2020   Melena 01/10/2020   GI bleed 07/19/2019   History of total bilateral knee replacement 01/25/2017   Primary osteoarthritis of both knees 01/25/2017   ST elevation myocardial infarction (STEMI) of inferior wall, initial episode of care (HCC) 08/23/2015   ST elevation myocardial infarction (STEMI) of inferior wall (HCC) 08/23/2015   Preoperative evaluation to rule out surgical contraindication 05/17/2012   CAD (coronary artery disease), native coronary artery 12/18/2010   Benign essential hypertension 12/18/2010   Type 2 diabetes mellitus (HCC) 12/18/2010   Dyslipidemia 12/18/2010   CKD (chronic kidney disease) stage 3, GFR 30-59 ml/min (HCC) 12/18/2010    Past Medical History:   Diagnosis Date   Arthritis    CAD (coronary artery disease)    DES to circumflex June 2017, moderate residual LAD disease   CKD (chronic kidney disease) stage 3, GFR 30-59 ml/min (HCC)    Essential hypertension    Mixed hyperlipidemia    Muscle cramps at night    NSTEMI (non-ST elevated myocardial infarction) (HCC) 2016   Probable type II event, negative Cardiolite 2012   Sleep apnea    Stroke (HCC)    tia   TIA (transient ischemic attack)    Type 2 diabetes mellitus (HCC)     Past Surgical History:  Procedure Laterality Date   CARDIAC CATHETERIZATION N/A 08/23/2015   Procedure: Left Heart Cath and Coronary Angiography;  Surgeon: Marykay Lex, MD;  Location: [facility] INVASIVE CV LAB;  Service: Cardiovascular;  Laterality: N/A;   CARDIAC CATHETERIZATION N/A 08/23/2015   Procedure: Coronary Stent Intervention;  Surgeon: Marykay Lex, MD;  Location: [facility] INVASIVE CV LAB;  Service: Cardiovascular;  Laterality: N/A;   CHOLECYSTECTOMY     EYE SURGERY  1990s   GIVENS CAPSULE STUDY N/A 04/01/2020   Procedure: GIVENS CAPSULE STUDY;  Surgeon: Dolores Frame, MD;  Location: [facility] ENDO SUITE;  Service: Gastroenterology;  Laterality: N/A;  7:30   KNEE ARTHROPLASTY Bilateral    TOTAL HIP ARTHROPLASTY Right 02/09/2021   Procedure: TOTAL HIP ARTHROPLASTY ANTERIOR APPROACH;  Surgeon: Ollen Gross, MD;  Location: [facility] ORS;  Service: Orthopedics;  Laterality: Right;   Tubal pregnancy removal      Prior to Admission medications   Medication Sig Start Date End Date Taking? Authorizing Provider  acetaminophen (TYLENOL) 650 MG CR tablet Take 1,950 mg by mouth  in the morning and at bedtime.    [provider]  aspirin EC 81 MG tablet Take 81 mg by mouth daily. Swallow whole.    [provider]  atorvastatin (LIPITOR) 80 MG tablet Take 1 tablet (80 mg total) by mouth daily at 6 PM. 08/26/15   Allayne Butcher, PA-C  Choline Fenofibrate 135 MG capsule Take 135 mg by mouth daily in  the afternoon.    [provider]  clotrimazole-betamethasone (LOTRISONE) cream Apply 1 application topically daily.    [provider]  Dexlansoprazole (DEXILANT) 30 MG capsule Take 30 mg by mouth daily with breakfast.    [provider]  folic acid (FOLVITE) 1 MG tablet Take 1 mg by mouth See admin instructions. Take 1 mg by mouth daily except for Saturday    [provider]  furosemide (LASIX) 20 MG tablet Take 20 mg by mouth daily with breakfast.    [provider]  levocetirizine (XYZAL) 5 MG tablet Take 5 mg by mouth every evening.    [provider]  meclizine (ANTIVERT) 25 MG tablet Take 25 mg by mouth every 6 (six) hours as needed for dizziness. 11/16/20   [provider]  metaxalone (SKELAXIN) 800 MG tablet Take 800 mg by mouth 2 (two) times daily.     [provider]  methocarbamol (ROBAXIN) 500 MG tablet Take 1 tablet (500 mg total) by mouth every 6 (six) hours as needed for muscle spasms. 02/11/21   Lesli Issa, Lyn Hollingshead, PA  methotrexate (RHEUMATREX) 2.5 MG tablet Take 20 mg by mouth every Saturday. Take 8 tablets on Saturday for 20mg  dose.    [provider]  metoprolol tartrate (LOPRESSOR) 50 MG tablet Take 25 mg by mouth daily.    [provider]  montelukast (SINGULAIR) 10 MG tablet Take 10 mg by mouth daily in the afternoon.    [provider]  nitroGLYCERIN (NITROSTAT) 0.4 MG SL tablet Place 1 tablet (0.4 mg total) under the tongue every 5 (five) minutes x 3 doses as needed for chest pain (if no relief after 2nd dose, proceed to ED or call 911). 06/25/21   Jonelle Sidle, MD  ondansetron (ZOFRAN-ODT) 4 MG disintegrating tablet Take 4 mg by mouth every 4 (four) hours as needed for nausea/vomiting. 11/02/20   [provider]  Polysaccharide Iron Complex (POLY-IRON 150 PO) Take 150 mg by mouth at bedtime.    [provider]  potassium chloride SA (KLOR-CON M) 20 MEQ  tablet Take 20 mEq by mouth daily. 04/27/21   [provider]  valACYclovir (VALTREX) 500 MG tablet Take 500 mg by mouth daily.    [provider]    Allergies  Allergen Reactions   Morphine And Codeine Other (See Comments)    Drowsiness    Social History   Socioeconomic History   Marital status: Widowed    Spouse name: Not on file   Number of children: Not on file   Years of education: Not on file   Highest education level: Not on file  Occupational History   Not on file  Tobacco Use   Smoking status: Former    Current packs/day: 0.00    Average packs/day: 1 pack/day for 20.0 years (20.0 ttl pk-yrs)    Types: Cigarettes    Start date: 03/22/1958    Quit date: 03/22/1978    Years since quitting: 44.7   Smokeless tobacco: Never  Vaping Use   Vaping status: Never Used  Substance  and Sexual Activity   Alcohol use: No    Alcohol/week: 0.0 standard drinks of alcohol   Drug use: No   Sexual activity: Not on file  Other Topics Concern   Not on file  Social History Narrative   Has 5 children   Social Determinants of Health   Financial Resource Strain: Low Risk  (04/15/2021)   Received from [facility], [facility]   Overall Financial Resource Strain (CARDIA)    Difficulty of Paying Living Expenses: Not hard at all  Food Insecurity: No Food Insecurity (04/15/2021)   Received from [facility], [facility]   Hunger Vital Sign    Worried About Running Out of Food in the Last Year: Never true    Ran Out of Food in the Last Year: Never true  Transportation Needs: No Transportation Needs (04/15/2021)   Received from [facility], [facility]   [facility] - Transportation    Lack of Transportation (Medical): No    Lack of Transportation (Non-Medical): No  Physical Activity: Inactive (04/15/2021)   Received from [facility], [facility]   Exercise Vital Sign    Days of Exercise per Week: 0 days    Minutes of Exercise per Session: 0 min   Stress: No Stress Concern Present (04/15/2021)   Received from [facility], [facility] - Occupational Stress Questionnaire    Feeling of Stress : Not at all  Social Connections: Moderately Isolated (04/15/2021)   Received from [facility], [facility]   Social Connection and Isolation Panel [NHANES]    Frequency of Communication with Friends and Family: More than three times a week    Frequency of Social Gatherings with Friends and Family: Twice a week    Attends Religious Services: More than 4 times per year    Active Member of [employer] or Organizations: No    Attends Banker Meetings: Never    Marital Status: Widowed  Intimate Partner Violence: Not At Risk (04/15/2021)   Received from [facility], [facility]   Humiliation, Afraid, Rape, and Kick questionnaire    Fear of Current or Ex-Partner: No    Emotionally Abused: No    Physically Abused: No    Sexually Abused: No    Tobacco Use: Medium Risk (11/23/2022)   Patient History    Smoking Tobacco Use: Former    Smokeless Tobacco Use: Never    Passive Exposure: Not on file   Social History   Substance and Sexual Activity  Alcohol Use No   Alcohol/week: 0.0 standard drinks of alcohol    Family History  Problem Relation Age of Onset   Heart attack Father    Diabetes Sister     Review of Systems  Constitutional:  Negative for chills and fever.  HENT:  Negative for congestion, sore throat and tinnitus.   Eyes:  Negative for double vision, photophobia and pain.  Respiratory:  Negative for cough, shortness of breath and wheezing.   Cardiovascular:  Negative for chest pain, palpitations and orthopnea.  Gastrointestinal:  Negative for heartburn, nausea and vomiting.  Genitourinary:  Negative for dysuria, frequency and urgency.  Musculoskeletal:  Positive for joint pain.  Neurological:  Negative for dizziness, weakness and headaches.      Objective:  Physical Exam: Well nourished and well developed.  General: Alert and oriented x3, cooperative and pleasant, no acute distress.  Head: normocephalic,  atraumatic, neck supple.  Eyes: EOMI.  Musculoskeletal:  Left Hip Exam: No tenderness to palpation at the greater trochanter. Mild discomfort with active and passive hip flexion and abduction. Range of motion: flexion 120 degrees, internal rotation 20 degrees, external rotation 30 degrees, abduction 30 degrees. Pain with provocative testing of the left hip.  Calves soft and nontender. Motor function intact in LE. Strength 5/5 LE bilaterally. Neuro: Distal pulses 2+. Sensation to light touch intact in LE.    Imaging Review Plain radiographs demonstrate severe degenerative joint disease of the left hip. The bone quality appears to be adequate for age and reported activity level.  Assessment/Plan:  End stage arthritis, left hip  The patient history, physical examination, clinical judgement of the provider and imaging studies are consistent with end stage degenerative joint disease of the left hip and total hip arthroplasty is deemed medically necessary. The treatment options including medical management, injection therapy, arthroscopy and arthroplasty were discussed at length. The risks and benefits of total hip arthroplasty were presented and reviewed. The risks due to aseptic loosening, infection, stiffness, dislocation/subluxation, thromboembolic complications and other imponderables were discussed. The patient acknowledged the explanation, agreed to proceed with the plan and consent was signed. Patient is being admitted for inpatient treatment for surgery, pain control, PT, OT, prophylactic antibiotics, VTE prophylaxis, progressive ambulation and ADLs and discharge planning.The patient is planning to be discharged  home .   Patient's anticipated LOS is less than 2 midnights, meeting these requirements: - Lives within  1 hour of care - Has a competent adult at home to recover with post-op recover - NO history of  - Chronic pain requiring opiods  - Diabetes  - Heart failure  - Stroke  - DVT/VTE  - Cardiac arrhythmia  - Respiratory Failure/COPD  - Renal failure  - Advanced Liver disease  Therapy Plans: HEP Disposition: Home with family Planned DVT Prophylaxis: Aspirin 81 mg BID DME Needed: None PCP: Dione Booze, MD (clearance received) Cardiologist: Nona Dell, MD (clearance received) TXA: IV Allergies: Morphine Anesthesia Concerns: None BMI: 34.2 Last HgbA1c: 5.4% (09/26/2022) Pain Regimen: Tramadol & hydrocodone Pharmacy: [facility]  Other: - Hx of anemia that resulted in right THA being cancelled multiples times in 2022. Per patient, this is improved. Will check with preop labs.  - PMH: CAD with stent placement   - Patient was instructed on what medications to stop prior to surgery. - Follow-up visit in 2 weeks with Dr. Lequita Halt - Begin physical therapy following surgery - Pre-operative lab work as pre-surgical testing - Prescriptions will be provided in hospital at time of discharge  Arther Abbott, PA-C Orthopedic Surgery [facility] [place]

## 2022-12-27 DIAGNOSIS — N1831 Chronic kidney disease, stage 3a: Secondary | ICD-10-CM | POA: Diagnosis not present

## 2022-12-27 DIAGNOSIS — D692 Other nonthrombocytopenic purpura: Secondary | ICD-10-CM | POA: Diagnosis not present

## 2022-12-27 DIAGNOSIS — E7849 Other hyperlipidemia: Secondary | ICD-10-CM | POA: Diagnosis not present

## 2022-12-27 DIAGNOSIS — E1121 Type 2 diabetes mellitus with diabetic nephropathy: Secondary | ICD-10-CM | POA: Diagnosis not present

## 2022-12-27 DIAGNOSIS — I2510 Atherosclerotic heart disease of native coronary artery without angina pectoris: Secondary | ICD-10-CM | POA: Diagnosis not present

## 2022-12-27 DIAGNOSIS — I10 Essential (primary) hypertension: Secondary | ICD-10-CM | POA: Diagnosis not present

## 2022-12-27 NOTE — Progress Notes (Signed)
Anesthesia Review:  [id number] - Clearance 10/04/22 on chart  Cardiologist : Sam Mcdowell LOV 11/23/22 preop card eval  Chest x-ray : EKG : 07/02/22  Echo : 2017  Stress test: 2017  Cardiac Cath :  2017  Activity level: can do a flight of stairs without difficulty  Sleep Study/ CPAP : No longer uses cpap due to weight loss.   Fasting Blood Sugar :      / Checks Blood Sugar -- times a day:   Blood Thinner/ Instructions /Last Dose: ASA / Instructions/ Last Dose :    81 mg aspirin    NO longer a diabetic due to weight loss Not on meds in over 3 years per pt  CBC done 12/29/22 with hgb of 9.3 routed to Dr Lequita Halt.

## 2022-12-27 NOTE — Patient Instructions (Signed)
SURGICAL WAITING ROOM VISITATION  Patients having surgery or a procedure may have no more than 2 support people in the waiting area - these visitors may rotate.    Children under the age of 21 must have an adult with them who is not the patient.  Due to an increase in RSV and influenza rates and associated hospitalizations, children ages 55 and under may not visit patients in [facility] hospitals.  If the patient needs to stay at the hospital during part of their recovery, the visitor guidelines for inpatient rooms apply. Pre-op nurse will coordinate an appropriate time for 1 support person to accompany patient in pre-op.  This support person may not rotate.    Please refer to the [facility] website for the visitor guidelines for Inpatients (after your surgery is over and you are in a regular room).       Your procedure is scheduled on:  01/10/23    Report to [facility] Main Entrance    Report to admitting at   1115AM   Call this number if you have problems the morning of surgery [phone]   Do not eat food :After Midnight.   After Midnight you may have the following liquids until __1045____ AM DAY OF SURGERY  Water Non-Citrus Juices (without pulp, NO RED-Apple, White grape, White cranberry) Black Coffee (NO MILK/CREAM OR CREAMERS, sugar ok)  Clear Tea (NO MILK/CREAM OR CREAMERS, sugar ok) regular and decaf                             Plain Jell-O (NO RED)                                           Fruit ices (not with fruit pulp, NO RED)                                     Popsicles (NO RED)                                                               Sports drinks like Gatorade (NO RED)                    The day of surgery:  Drink ONE (1) Pre-Surgery Clear Ensure or G2 at  1045AM ( have completed by )  the morning of surgery. Drink in one sitting. Do not sip.  This drink was given to you during your hospital  pre-op appointment visit. Nothing else to drink  after completing the  Pre-Surgery Clear Ensure or G2.          If you have questions, please contact your surgeon's office.       Oral Hygiene is also important to reduce your risk of infection.                                    Remember - BRUSH YOUR TEETH THE MORNING OF SURGERY WITH YOUR REGULAR  TOOTHPASTE  DENTURES WILL BE REMOVED PRIOR TO SURGERY PLEASE DO NOT APPLY "Poly grip" OR ADHESIVES!!!   Do NOT smoke after Midnight   Stop all vitamins and herbal supplements 7 days before surgery.   Take these medicines the morning of surgery with A SIP OF WATER:  dexilant, meclizine if needed, metoprolol   DO NOT TAKE ANY ORAL DIABETIC MEDICATIONS DAY OF YOUR SURGERY  Bring CPAP mask and tubing day of surgery.                              You may not have any metal on your body including hair pins, jewelry, and body piercing             Do not wear make-up, lotions, powders, perfumes/cologne, or deodorant  Do not wear nail polish including gel and S&S, artificial/acrylic nails, or any other type of covering on natural nails including finger and toenails. If you have artificial nails, gel coating, etc. that needs to be removed by a nail salon please have this removed prior to surgery or surgery may need to be canceled/ delayed if the surgeon/ anesthesia feels like they are unable to be safely monitored.   Do not shave  48 hours prior to surgery.               Men may shave face and neck.   Do not bring valuables to the hospital. [facility] IS NOT             RESPONSIBLE   FOR VALUABLES.   Contacts, glasses, dentures or bridgework may not be worn into surgery.   Bring small overnight bag day of surgery.   DO NOT BRING YOUR HOME MEDICATIONS TO THE HOSPITAL. PHARMACY WILL DISPENSE MEDICATIONS LISTED ON YOUR MEDICATION LIST TO YOU DURING YOUR ADMISSION IN THE HOSPITAL!    Patients discharged on the day of surgery will not be allowed to drive home.  Someone NEEDS to stay with you for  the first 24 hours after anesthesia.   Special Instructions: Bring a copy of your healthcare power of attorney and living will documents the day of surgery if you haven't scanned them before.              Please read over the following fact sheets you were given: IF YOU HAVE QUESTIONS ABOUT YOUR PRE-OP INSTRUCTIONS PLEASE CALL [phone]   If you received a COVID test during your pre-op visit  it is requested that you wear a mask when out in public, stay away from anyone that may not be feeling well and notify your surgeon if you develop symptoms. If you test positive for Covid or have been in contact with anyone that has tested positive in the last 10 days please notify you surgeon.      Pre-operative 5 CHG Bath Instructions   You can play a key role in reducing the risk of infection after surgery. Your skin needs to be as free of germs as possible. You can reduce the number of germs on your skin by washing with CHG (chlorhexidine gluconate) soap before surgery. CHG is an antiseptic soap that kills germs and continues to kill germs even after washing.   DO NOT use if you have an allergy to chlorhexidine/CHG or antibacterial soaps. If your skin becomes reddened or irritated, stop using the CHG and notify one of our RNs at [phone].   Please shower with the CHG  soap starting 4 days before surgery using the following schedule:     Please keep in mind the following:  DO NOT shave, including legs and underarms, starting the day of your first shower.   You may shave your face at any point before/day of surgery.  Place clean sheets on your bed the day you start using CHG soap. Use a clean washcloth (not used since being washed) for each shower. DO NOT sleep with pets once you start using the CHG.   CHG Shower Instructions:  If you choose to wash your hair and private area, wash first with your normal shampoo/soap.  After you use shampoo/soap, rinse your hair and body thoroughly to  remove shampoo/soap residue.  Turn the water OFF and apply about 3 tablespoons (45 ml) of CHG soap to a CLEAN washcloth.  Apply CHG soap ONLY FROM YOUR NECK DOWN TO YOUR TOES (washing for 3-5 minutes)  DO NOT use CHG soap on face, private areas, open wounds, or sores.  Pay special attention to the area where your surgery is being performed.  If you are having back surgery, having someone wash your back for you may be helpful. Wait 2 minutes after CHG soap is applied, then you may rinse off the CHG soap.  Pat dry with a clean towel  Put on clean clothes/pajamas   If you choose to wear lotion, please use ONLY the CHG-compatible lotions on the back of this paper.     Additional instructions for the day of surgery: DO NOT APPLY any lotions, deodorants, cologne, or perfumes.   Put on clean/comfortable clothes.  Brush your teeth.  Ask your nurse before applying any prescription medications to the skin.      CHG Compatible Lotions   Aveeno Moisturizing lotion  Cetaphil Moisturizing Cream  Cetaphil Moisturizing Lotion  Clairol Herbal Essence Moisturizing Lotion, Dry Skin  Clairol Herbal Essence Moisturizing Lotion, Extra Dry Skin  Clairol Herbal Essence Moisturizing Lotion, Normal Skin  Curel Age Defying Therapeutic Moisturizing Lotion with Alpha Hydroxy  Curel Extreme Care Body Lotion  Curel Soothing Hands Moisturizing Hand Lotion  Curel Therapeutic Moisturizing Cream, Fragrance-Free  Curel Therapeutic Moisturizing Lotion, Fragrance-Free  Curel Therapeutic Moisturizing Lotion, Original Formula  Eucerin Daily Replenishing Lotion  Eucerin Dry Skin Therapy Plus Alpha Hydroxy Crme  Eucerin Dry Skin Therapy Plus Alpha Hydroxy Lotion  Eucerin Original Crme  Eucerin Original Lotion  Eucerin Plus Crme Eucerin Plus Lotion  Eucerin TriLipid Replenishing Lotion  Keri Anti-Bacterial Hand Lotion  Keri Deep Conditioning Original Lotion Dry Skin Formula Softly Scented  Keri Deep Conditioning  Original Lotion, Fragrance Free Sensitive Skin Formula  Keri Lotion Fast Absorbing Fragrance Free Sensitive Skin Formula  Keri Lotion Fast Absorbing Softly Scented Dry Skin Formula  Keri Original Lotion  Keri Skin Renewal Lotion Keri Silky Smooth Lotion  Keri Silky Smooth Sensitive Skin Lotion  Nivea Body Creamy Conditioning Oil  Nivea Body Extra Enriched Teacher, adult education Moisturizing Lotion Nivea Crme  Nivea Skin Firming Lotion  NutraDerm 30 Skin Lotion  NutraDerm Skin Lotion  NutraDerm Therapeutic Skin Cream  NutraDerm Therapeutic Skin Lotion  ProShield Protective Hand Cream  Provon moisturizing lotion

## 2022-12-29 ENCOUNTER — Other Ambulatory Visit: Payer: Self-pay

## 2022-12-29 ENCOUNTER — Encounter (HOSPITAL_COMMUNITY): Payer: Self-pay

## 2022-12-29 ENCOUNTER — Encounter (HOSPITAL_COMMUNITY)
Admission: RE | Admit: 2022-12-29 | Discharge: 2022-12-29 | Disposition: A | Discharge status: Home / Self Care | Payer: Medicare Other | Source: Ambulatory Visit | Attending: Orthopedic Surgery | Admitting: Orthopedic Surgery

## 2022-12-29 VITALS — BP 158/63 | HR 67 | Temp 98.4°F | Resp 16 | Ht 64.0 in | Wt 195.0 lb

## 2022-12-29 DIAGNOSIS — D649 Anemia, unspecified: Secondary | ICD-10-CM | POA: Diagnosis not present

## 2022-12-29 DIAGNOSIS — Z01812 Encounter for preprocedural laboratory examination: Secondary | ICD-10-CM | POA: Diagnosis not present

## 2022-12-29 DIAGNOSIS — Z01818 Encounter for other preprocedural examination: Secondary | ICD-10-CM

## 2022-12-29 LAB — CBC
HCT: 28 % — ABNORMAL LOW (ref 36.0–46.0)
Hemoglobin: 9.3 g/dL — ABNORMAL LOW (ref 12.0–15.0)
MCH: 31.7 pg (ref 26.0–34.0)
MCHC: 33.2 g/dL (ref 30.0–36.0)
MCV: 95.6 fL (ref 80.0–100.0)
Platelets: 197 10*3/uL (ref 150–400)
RBC: 2.93 MIL/uL — ABNORMAL LOW (ref 3.87–5.11)
RDW: 17.4 % — ABNORMAL HIGH (ref 11.5–15.5)
WBC: 4.2 10*3/uL (ref 4.0–10.5)
nRBC: 0 % (ref 0.0–0.2)

## 2022-12-29 LAB — SURGICAL PCR SCREEN
MRSA, PCR: NEGATIVE
Staphylococcus aureus: NEGATIVE

## 2022-12-29 LAB — BASIC METABOLIC PANEL
Anion gap: 8 (ref 5–15)
BUN: 16 mg/dL (ref 8–23)
CO2: 23 mmol/L (ref 22–32)
Calcium: 8.7 mg/dL — ABNORMAL LOW (ref 8.9–10.3)
Chloride: 107 mmol/L (ref 98–111)
Creatinine, Ser: 1.19 mg/dL — ABNORMAL HIGH (ref 0.44–1.00)
GFR, Estimated: 47 mL/min — ABNORMAL LOW (ref >60)
Glucose, Bld: 120 mg/dL — ABNORMAL HIGH (ref 70–99)
Potassium: 3.9 mmol/L (ref 3.5–5.1)
Sodium: 138 mmol/L (ref 135–145)

## 2022-12-29 LAB — TYPE AND SCREEN
ABO/RH(D): A POS
Antibody Screen: NEGATIVE

## 2022-12-30 ENCOUNTER — Encounter (HOSPITAL_COMMUNITY): Payer: Self-pay

## 2022-12-30 NOTE — Progress Notes (Signed)
DISCUSSION: Diana Patterson is a 77 yo female who presents to PAT prior to L THA on 01/10/23 with Dr. Lequita Halt. PMH of former smoking (quit 1980), CAD s/p PCI in 2017, hx of NSTEMI, HTN, HLD, hx of TIA, CKD stage 3, OSA (no longer uses CPAP), anemia, RA on Methotrexate, arthritis.  Patient follows with Cardiology for hx of CAD s/p PCI in 2017. Last seen in clinic on 11/23/22 for follow up and pre-op clearance. Noted to be doing well on medical therapy. Advised f/u in 6 months. Cardiac clearance: "Preoperative cardiac assessment.  Overall intermediate risk with RCRI cardiac risk index class III, 6.6% chance of major adverse cardiac event.  In the absence of worsening angina or heart failure symptoms, no further cardiac testing is indicated at this time.  She is clinically stable on medical therapy, may hold aspirin 7 days prior to operation."  Patient follows with Hematology for anemia. Last seen 03/08/2022. Getting Aranesp injections. Previously has needed IV iron infusions. Baseline anemia ~10. Advised to return to clinic if hgb is <10.  VS: BP (!) 158/63   Pulse 67   Temp 36.9 C (Oral)   Resp 16   Ht 5\' 4"  (1.626 m)   Wt 88.5 kg   SpO2 100%   BMI 33.47 kg/m   PROVIDERS: Toma Deiters, MD Cardiology: Nona Dell, MD  LABS: Labs reviewed: Acceptable for surgery. (all labs ordered are listed, but only abnormal results are displayed)  Labs Reviewed  BASIC METABOLIC PANEL - Abnormal; Notable for the following components:      Result Value   Glucose, Bld 120 (*)    Creatinine, Ser 1.19 (*)    Calcium 8.7 (*)    GFR, Estimated 47 (*)    All other components within normal limits  CBC - Abnormal; Notable for the following components:   RBC 2.93 (*)    Hemoglobin 9.3 (*)    HCT 28.0 (*)    RDW 17.4 (*)    All other components within normal limits  SURGICAL PCR SCREEN  TYPE AND SCREEN     IMAGES:   EKG:   CV:  Stress test 11/11/2015:  No diagnostic ST segment changes to  indicate ischemia. Small, mild intensity, apical anterior defect that is predominantly fixed and most consistent with variable soft tissue attenuation. No large degree of anterior ischemia noted. This is a low risk study. Nuclear stress EF: 54%.  Echo 08/25/2015:  Study Conclusions   - Left ventricle: The cavity size was normal. Wall thickness was    increased in a pattern of mild LVH. Systolic function was normal.    The estimated ejection fraction was in the range of 60% to 65%.    Wall motion was normal; there were no regional wall motion    abnormalities. Doppler parameters are consistent with abnormal    left ventricular relaxation (grade 1 diastolic dysfunction).  - Mitral valve: Mildly calcified annulus.   Left heart cath 08/23/2015:  Prox Cx to Mid Cx lesion, 90% stenosed. Post intervention aspiration thrombectomy followed by Promus Premier DES 3.5 mm x 12 mm (3.75 mm) , there is a 0% residual stenosis. Prox LAD-2 lesion, 50-75% stenosed. The left ventricular systolic function is normal. Elevated LVEDP     Likely culprit lesion was the thrombotic stenosis of the circumflex. It would appear that there was reperfusion after heparin and aspirin was given in the emergency room. This would be consistent with her improved chest pain upon arrival.   Successful  PCI of the proximal dominant Circumflex. There is residual disease in the proximal-mid LAD prior to D1. This does not appear to be flow-limiting, however would probably benefit from being investigated. I would determine course of action based on her symptoms. If she is a symptomatically, could consider discharge with plans for outpatient Myoview to basket the LAD, otherwise if she has symptoms of angina following this PCI, I would proceed with staged PCI of the LAD prior to her discharge.   Plan: Admit to ICU.  Aggrastat for 12 hours Aspirin plus Brilinta High-dose statin, and continue home beta blocker Sliding scale insulin,  hold metformin Home medications     If she does well, could anticipate fast-track discharge. Otherwise would consider staged PCI of the LAD if symptoms occur. If not would consider outpatient Myoview in 4-6 weeks. She can follow back up again with Dr. Diona Browner  Past Medical History:  Diagnosis Date   Arthritis    CAD (coronary artery disease)    DES to circumflex June 2017, moderate residual LAD disease   CKD (chronic kidney disease) stage 3, GFR 30-59 ml/min (HCC)    Essential hypertension    Mixed hyperlipidemia    Muscle cramps at night    NSTEMI (non-ST elevated myocardial infarction) (HCC) 2016   Probable type II event, negative Cardiolite 2012   Sleep apnea    no onger uses cpap   TIA (transient ischemic attack)     Past Surgical History:  Procedure Laterality Date   CARDIAC CATHETERIZATION N/A 08/23/2015   Procedure: Left Heart Cath and Coronary Angiography;  Surgeon: Marykay Lex, MD;  Location: [facility] INVASIVE CV LAB;  Service: Cardiovascular;  Laterality: N/A;   CARDIAC CATHETERIZATION N/A 08/23/2015   Procedure: Coronary Stent Intervention;  Surgeon: Marykay Lex, MD;  Location: [facility] INVASIVE CV LAB;  Service: Cardiovascular;  Laterality: N/A;   CHOLECYSTECTOMY     EYE SURGERY  1990s   GIVENS CAPSULE STUDY N/A 04/01/2020   Procedure: GIVENS CAPSULE STUDY;  Surgeon: Dolores Frame, MD;  Location: [facility] ENDO SUITE;  Service: Gastroenterology;  Laterality: N/A;  7:30   KNEE ARTHROPLASTY Bilateral    TOTAL HIP ARTHROPLASTY Right 02/09/2021   Procedure: TOTAL HIP ARTHROPLASTY ANTERIOR APPROACH;  Surgeon: Ollen Gross, MD;  Location: [facility] ORS;  Service: Orthopedics;  Laterality: Right;   Tubal pregnancy removal      MEDICATIONS:  acetaminophen (TYLENOL) 650 MG CR tablet   aspirin EC 81 MG tablet   atorvastatin (LIPITOR) 80 MG tablet   Choline Fenofibrate 135 MG capsule   clotrimazole-betamethasone (LOTRISONE) cream   Dexlansoprazole (DEXILANT) 30 MG capsule    folic acid (FOLVITE) 1 MG tablet   furosemide (LASIX) 20 MG tablet   levocetirizine (XYZAL) 5 MG tablet   meclizine (ANTIVERT) 25 MG tablet   metaxalone (SKELAXIN) 800 MG tablet   methocarbamol (ROBAXIN) 500 MG tablet   methotrexate (RHEUMATREX) 2.5 MG tablet   metoprolol tartrate (LOPRESSOR) 50 MG tablet   montelukast (SINGULAIR) 10 MG tablet   nitroGLYCERIN (NITROSTAT) 0.4 MG SL tablet   ondansetron (ZOFRAN-ODT) 4 MG disintegrating tablet   Polysaccharide Iron Complex (POLY-IRON 150 PO)   potassium chloride SA (KLOR-CON M) 20 MEQ tablet   traMADol (ULTRAM) 50 MG tablet   valACYclovir (VALTREX) 500 MG tablet   No current facility-administered medications for this encounter.

## 2023-01-03 DIAGNOSIS — I10 Essential (primary) hypertension: Secondary | ICD-10-CM | POA: Diagnosis not present

## 2023-01-03 DIAGNOSIS — N1831 Chronic kidney disease, stage 3a: Secondary | ICD-10-CM | POA: Diagnosis not present

## 2023-01-03 DIAGNOSIS — D631 Anemia in chronic kidney disease: Secondary | ICD-10-CM | POA: Diagnosis not present

## 2023-01-03 DIAGNOSIS — E785 Hyperlipidemia, unspecified: Secondary | ICD-10-CM | POA: Diagnosis not present

## 2023-01-03 DIAGNOSIS — E876 Hypokalemia: Secondary | ICD-10-CM | POA: Diagnosis not present

## 2023-01-03 DIAGNOSIS — R809 Proteinuria, unspecified: Secondary | ICD-10-CM | POA: Diagnosis not present

## 2023-01-03 DIAGNOSIS — E1129 Type 2 diabetes mellitus with other diabetic kidney complication: Secondary | ICD-10-CM | POA: Diagnosis not present

## 2023-01-04 ENCOUNTER — Ambulatory Visit: Payer: Medicare Other | Admitting: Cardiology

## 2023-01-04 DIAGNOSIS — D631 Anemia in chronic kidney disease: Secondary | ICD-10-CM | POA: Diagnosis not present

## 2023-01-04 DIAGNOSIS — N1831 Chronic kidney disease, stage 3a: Secondary | ICD-10-CM | POA: Diagnosis not present

## 2023-01-10 ENCOUNTER — Encounter (HOSPITAL_COMMUNITY): Admission: RE | Payer: Self-pay | Source: Ambulatory Visit

## 2023-01-10 ENCOUNTER — Ambulatory Visit (HOSPITAL_COMMUNITY): Admission: RE | Admit: 2023-01-10 | Payer: Medicare Other | Source: Ambulatory Visit | Admitting: Orthopedic Surgery

## 2023-01-10 DIAGNOSIS — D649 Anemia, unspecified: Secondary | ICD-10-CM | POA: Diagnosis not present

## 2023-01-10 SURGERY — ARTHROPLASTY, HIP, TOTAL, ANTERIOR APPROACH
Anesthesia: Choice | Site: Hip | Laterality: Left

## 2023-01-10 NOTE — Progress Notes (Signed)
Patient presented for surgery today stating "she was unaware that her surgery has been cancelled". I called Dr Lequita Halt to clarify what happened and the reason for being cancelled. Per Dr Lequita Halt, Patient is too anemic for surgery and that the patient needed to follow up with PCP.  Per Dr Lequita Halt, his PA spoke with the patient and explained that she was being reschedule but there is a mis-communication and the patient did not realize that it was being rescheduled for another day.  The patient stated that "that the PA said she was leaving her on the schedule, and to follow up with her PCP.    I explained to the patient that she will need to follow up with her PCP for a recollect for labs to check her anemia status and to call Dr Salina April office to be rescheduled.    Patients family was a little upset because one daughter came from [place] and the other drove from [place].

## 2023-01-13 DIAGNOSIS — N1831 Chronic kidney disease, stage 3a: Secondary | ICD-10-CM | POA: Diagnosis not present

## 2023-01-13 DIAGNOSIS — E1121 Type 2 diabetes mellitus with diabetic nephropathy: Secondary | ICD-10-CM | POA: Diagnosis not present

## 2023-01-13 DIAGNOSIS — I10 Essential (primary) hypertension: Secondary | ICD-10-CM | POA: Diagnosis not present

## 2023-01-13 DIAGNOSIS — I2510 Atherosclerotic heart disease of native coronary artery without angina pectoris: Secondary | ICD-10-CM | POA: Diagnosis not present

## 2023-01-13 DIAGNOSIS — E7849 Other hyperlipidemia: Secondary | ICD-10-CM | POA: Diagnosis not present

## 2023-01-14 DIAGNOSIS — N1831 Chronic kidney disease, stage 3a: Secondary | ICD-10-CM | POA: Diagnosis not present

## 2023-01-18 DIAGNOSIS — D649 Anemia, unspecified: Secondary | ICD-10-CM | POA: Diagnosis not present

## 2023-01-25 DIAGNOSIS — N1832 Chronic kidney disease, stage 3b: Secondary | ICD-10-CM | POA: Diagnosis not present

## 2023-01-25 DIAGNOSIS — Z79631 Long term (current) use of antimetabolite agent: Secondary | ICD-10-CM | POA: Diagnosis not present

## 2023-01-25 DIAGNOSIS — D631 Anemia in chronic kidney disease: Secondary | ICD-10-CM | POA: Diagnosis not present

## 2023-01-25 DIAGNOSIS — M0609 Rheumatoid arthritis without rheumatoid factor, multiple sites: Secondary | ICD-10-CM | POA: Diagnosis not present

## 2023-01-25 DIAGNOSIS — K760 Fatty (change of) liver, not elsewhere classified: Secondary | ICD-10-CM | POA: Diagnosis not present

## 2023-01-28 DIAGNOSIS — Z79631 Long term (current) use of antimetabolite agent: Secondary | ICD-10-CM | POA: Diagnosis not present

## 2023-01-28 DIAGNOSIS — N1832 Chronic kidney disease, stage 3b: Secondary | ICD-10-CM | POA: Diagnosis not present

## 2023-01-28 DIAGNOSIS — D631 Anemia in chronic kidney disease: Secondary | ICD-10-CM | POA: Diagnosis not present

## 2023-01-28 DIAGNOSIS — K760 Fatty (change of) liver, not elsewhere classified: Secondary | ICD-10-CM | POA: Diagnosis not present

## 2023-02-14 DIAGNOSIS — E1121 Type 2 diabetes mellitus with diabetic nephropathy: Secondary | ICD-10-CM | POA: Diagnosis not present

## 2023-02-14 DIAGNOSIS — N1831 Chronic kidney disease, stage 3a: Secondary | ICD-10-CM | POA: Diagnosis not present

## 2023-02-14 DIAGNOSIS — E7849 Other hyperlipidemia: Secondary | ICD-10-CM | POA: Diagnosis not present

## 2023-02-14 DIAGNOSIS — I10 Essential (primary) hypertension: Secondary | ICD-10-CM | POA: Diagnosis not present

## 2023-02-16 DIAGNOSIS — N1831 Chronic kidney disease, stage 3a: Secondary | ICD-10-CM | POA: Diagnosis not present

## 2023-02-16 DIAGNOSIS — N1832 Chronic kidney disease, stage 3b: Secondary | ICD-10-CM | POA: Diagnosis not present

## 2023-02-16 DIAGNOSIS — M0609 Rheumatoid arthritis without rheumatoid factor, multiple sites: Secondary | ICD-10-CM | POA: Diagnosis not present

## 2023-02-16 DIAGNOSIS — D631 Anemia in chronic kidney disease: Secondary | ICD-10-CM | POA: Diagnosis not present

## 2023-02-21 DIAGNOSIS — N1832 Chronic kidney disease, stage 3b: Secondary | ICD-10-CM | POA: Diagnosis not present

## 2023-02-21 DIAGNOSIS — D631 Anemia in chronic kidney disease: Secondary | ICD-10-CM | POA: Diagnosis not present

## 2023-02-21 DIAGNOSIS — K760 Fatty (change of) liver, not elsewhere classified: Secondary | ICD-10-CM | POA: Diagnosis not present

## 2023-02-21 DIAGNOSIS — D599 Acquired hemolytic anemia, unspecified: Secondary | ICD-10-CM | POA: Diagnosis not present

## 2023-02-21 DIAGNOSIS — Z79631 Long term (current) use of antimetabolite agent: Secondary | ICD-10-CM | POA: Diagnosis not present

## 2023-03-14 DIAGNOSIS — M0609 Rheumatoid arthritis without rheumatoid factor, multiple sites: Secondary | ICD-10-CM | POA: Diagnosis not present

## 2023-03-14 DIAGNOSIS — D631 Anemia in chronic kidney disease: Secondary | ICD-10-CM | POA: Diagnosis not present

## 2023-03-14 DIAGNOSIS — N1832 Chronic kidney disease, stage 3b: Secondary | ICD-10-CM | POA: Diagnosis not present

## 2023-03-14 DIAGNOSIS — D573 Sickle-cell trait: Secondary | ICD-10-CM | POA: Diagnosis not present

## 2023-03-14 DIAGNOSIS — Z79631 Long term (current) use of antimetabolite agent: Secondary | ICD-10-CM | POA: Diagnosis not present

## 2023-03-29 DIAGNOSIS — D631 Anemia in chronic kidney disease: Secondary | ICD-10-CM | POA: Diagnosis not present

## 2023-03-29 DIAGNOSIS — N1832 Chronic kidney disease, stage 3b: Secondary | ICD-10-CM | POA: Diagnosis not present

## 2023-03-29 DIAGNOSIS — D573 Sickle-cell trait: Secondary | ICD-10-CM | POA: Diagnosis not present

## 2023-03-31 ENCOUNTER — Telehealth: Payer: Self-pay

## 2023-03-31 NOTE — Telephone Encounter (Signed)
   Pre-operative Risk Assessment    Patient Name: Diana Patterson  DOB: September 28, 1945 MRN: [id number]   Date of last office visit: 11/23/22 Dr. Debera  Date of next office visit: 06/02/23 Dr. Debera      Request for Surgical Clearance    Procedure:   left total hip arthroplasty   Date of Surgery:  Clearance 04/25/23                                 Surgeon:  Dr. Dempsey Moan  Surgeon's Group or Practice Name:  [facility]  Phone number:  [phone]  Fax number:  [phone]   Type of Clearance Requested:   - Medical  - Pharmacy:  Hold Aspirin  Not indicated    Type of Anesthesia:   Choice    Additional requests/questions:    SignedRebeca Blight   03/31/2023, 2:24 PM

## 2023-04-01 ENCOUNTER — Telehealth: Payer: Self-pay | Admitting: *Deleted

## 2023-04-01 NOTE — Telephone Encounter (Signed)
   Name: Diana Patterson  DOB: Jun 16, 1945  MRN: [id number]  Primary Cardiologist: Jayson Sierras, MD  Chart reviewed as part of pre-operative protocol coverage. Because of Diana Patterson's past medical history and time since last visit, she will require a follow-up telephone visit in order to better assess preoperative cardiovascular risk.  Pre-op covering staff: - Please schedule appointment and call patient to inform them. If patient already had an upcoming appointment within acceptable timeframe, please add pre-op clearance to the appointment notes so provider is aware. - Please contact requesting surgeon's office via preferred method (i.e, phone, fax) to inform them of need for appointment prior to surgery.  Cleared back in Sept:  Preoperative cardiac assessment. Overall intermediate risk with RCRI cardiac risk index class III, 6.6% chance of major adverse cardiac event. In the absence of worsening angina or heart failure symptoms, no further cardiac testing is indicated at this time. She is clinically stable on medical therapy, may hold aspirin  7 days prior to operation.    [name], PA-C  04/01/2023, 10:43 AM

## 2023-04-01 NOTE — Telephone Encounter (Signed)
 Pt has been scheduled tele preop appt 04/08/23. Med rec and consent are done.

## 2023-04-01 NOTE — Telephone Encounter (Signed)
 Pt has been scheduled tele preop appt 04/08/23. Med rec and consent are done.     Patient Consent for Virtual Visit        Diana Patterson has provided verbal consent on 04/01/2023 for a virtual visit (video or telephone).   CONSENT FOR VIRTUAL VISIT FOR:  Diana Patterson  By participating in this virtual visit I agree to the following:  I hereby voluntarily request, consent and authorize [facility] and its employed or contracted physicians, physician assistants, nurse practitioners or other licensed health care professionals (the Practitioner), to provide me with telemedicine health care services (the "Services) as deemed necessary by the treating Practitioner. I acknowledge and consent to receive the Services by the Practitioner via telemedicine. I understand that the telemedicine visit will involve communicating with the Practitioner through live audiovisual communication technology and the disclosure of certain medical information by electronic transmission. I acknowledge that I have been given the opportunity to request an in-person assessment or other available alternative prior to the telemedicine visit and am voluntarily participating in the telemedicine visit.  I understand that I have the right to withhold or withdraw my consent to the use of telemedicine in the course of my care at any time, without affecting my right to future care or treatment, and that the Practitioner or I may terminate the telemedicine visit at any time. I understand that I have the right to inspect all information obtained and/or recorded in the course of the telemedicine visit and may receive copies of available information for a reasonable fee.  I understand that some of the potential risks of receiving the Services via telemedicine include:  Delay or interruption in medical evaluation due to technological equipment failure or disruption; Information transmitted may not be sufficient (e.g.  poor resolution of images) to allow for appropriate medical decision making by the Practitioner; and/or  In rare instances, security protocols could fail, causing a breach of personal health information.  Furthermore, I acknowledge that it is my responsibility to provide information about my medical history, conditions and care that is complete and accurate to the best of my ability. I acknowledge that Practitioner's advice, recommendations, and/or decision may be based on factors not within their control, such as incomplete or inaccurate data provided by me or distortions of diagnostic images or specimens that may result from electronic transmissions. I understand that the practice of medicine is not an exact science and that Practitioner makes no warranties or guarantees regarding treatment outcomes. I acknowledge that a copy of this consent can be made available to me via my patient portal [facility] MyChart), or I can request a printed copy by calling the office of [facility].    I understand that my insurance will be billed for this visit.   I have read or had this consent read to me. I understand the contents of this consent, which adequately explains the benefits and risks of the Services being provided via telemedicine.  I have been provided ample opportunity to ask questions regarding this consent and the Services and have had my questions answered to my satisfaction. I give my informed consent for the services to be provided through the use of telemedicine in my medical care

## 2023-04-04 DIAGNOSIS — E1121 Type 2 diabetes mellitus with diabetic nephropathy: Secondary | ICD-10-CM | POA: Diagnosis not present

## 2023-04-04 DIAGNOSIS — I10 Essential (primary) hypertension: Secondary | ICD-10-CM | POA: Diagnosis not present

## 2023-04-04 DIAGNOSIS — Z0000 Encounter for general adult medical examination without abnormal findings: Secondary | ICD-10-CM | POA: Diagnosis not present

## 2023-04-04 DIAGNOSIS — N1831 Chronic kidney disease, stage 3a: Secondary | ICD-10-CM | POA: Diagnosis not present

## 2023-04-04 DIAGNOSIS — D692 Other nonthrombocytopenic purpura: Secondary | ICD-10-CM | POA: Diagnosis not present

## 2023-04-04 DIAGNOSIS — E1122 Type 2 diabetes mellitus with diabetic chronic kidney disease: Secondary | ICD-10-CM | POA: Diagnosis not present

## 2023-04-04 DIAGNOSIS — I2510 Atherosclerotic heart disease of native coronary artery without angina pectoris: Secondary | ICD-10-CM | POA: Diagnosis not present

## 2023-04-04 DIAGNOSIS — E7849 Other hyperlipidemia: Secondary | ICD-10-CM | POA: Diagnosis not present

## 2023-04-04 DIAGNOSIS — Z1331 Encounter for screening for depression: Secondary | ICD-10-CM | POA: Diagnosis not present

## 2023-04-08 ENCOUNTER — Ambulatory Visit: Payer: Medicare Other

## 2023-04-08 NOTE — Telephone Encounter (Signed)
Pt has appt 04/12/23 with Sharlene Dory, NP.  I will update all parties involved.

## 2023-04-08 NOTE — Telephone Encounter (Signed)
I will send a message to [facility] scheduling team to see if they can get the pt in sooner as she now needs preop clearance. Pt has a 6 month f/u 06/02/23 with Dr. Diona Browner as well.

## 2023-04-08 NOTE — Telephone Encounter (Signed)
   Patient Name: Diana Patterson  DOB: [birth year] MRN: [id number]  Primary Cardiologist: Nona Dell, MD  Chart reviewed as part of pre-operative protocol coverage.  Patient was originally scheduled for telephone visit on 04/08/2023.  Upon chart review, patient resides in [place].  Therefore, we are unable to complete telephone visit for preoperative cardiac evaluation. She will require an office visit for preoperative cardiac evaluation.  I will have our preoperative coverage team reach out to patient to schedule appointment.   Joylene Grapes, NP 04/08/2023, 9:04 AM

## 2023-04-11 NOTE — Patient Instructions (Signed)
DUE TO COVID-19 ONLY TWO VISITORS  (aged 79 and older)  ARE ALLOWED TO COME WITH YOU AND STAY IN THE WAITING ROOM ONLY DURING PRE OP AND PROCEDURE.   **NO VISITORS ARE ALLOWED IN THE SHORT STAY AREA OR RECOVERY ROOM!!**  IF YOU WILL BE ADMITTED INTO THE HOSPITAL YOU ARE ALLOWED ONLY FOUR SUPPORT PEOPLE DURING VISITATION HOURS ONLY (7 AM -8PM)   The support person(s) must pass our screening, gel in and out, and wear a mask at all times, including in the patient's room. Patients must also wear a mask when staff or their support person are in the room. Visitors GUEST BADGE MUST BE WORN VISIBLY  One adult visitor may remain with you overnight and MUST be in the room by 8 P.M.     Your procedure is scheduled on: 04/25/23   Report to [facility] Main Entrance    Report to admitting at : 11;15 AM   Call this number if you have problems the morning of surgery [phone]   Do not eat food :After Midnight.   After Midnight you may have the following liquids until : 10:45 AM DAY OF SURGERY  Water Black Coffee (sugar ok, NO MILK/CREAM OR CREAMERS)  Tea (sugar ok, NO MILK/CREAM OR CREAMERS) regular and decaf                             Plain Jell-O (NO RED)                                           Fruit ices (not with fruit pulp, NO RED)                                     Popsicles (NO RED)                                                                  Juice: apple, WHITE grape, WHITE cranberry Sports drinks like Gatorade (NO RED)   The day of surgery:  Drink ONE (1) Pre-Surgery Clear G2 at : 10:45 AM the morning of surgery. Drink in one sitting. Do not sip.  This drink was given to you during your hospital  pre-op appointment visit. Nothing else to drink after completing the  Pre-Surgery Clear Ensure or G2.          If you have questions, please contact your surgeon's office.  FOLLOW ANY ADDITIONAL PRE OP INSTRUCTIONS YOU RECEIVED FROM YOUR SURGEON'S OFFICE!!!   Oral  Hygiene is also important to reduce your risk of infection.                                    Remember - BRUSH YOUR TEETH THE MORNING OF SURGERY WITH YOUR REGULAR TOOTHPASTE  DENTURES WILL BE REMOVED PRIOR TO SURGERY PLEASE DO NOT APPLY "Poly grip" OR ADHESIVES!!!   Do NOT smoke after Midnight   Take these medicines the morning of surgery  with A SIP OF WATER: metoprolol,prednisone,dexlansoprazole.Tylenol,meclizine as needed.  DO NOT TAKE ANY ORAL DIABETIC MEDICATIONS DAY OF YOUR SURGERY  Bring CPAP mask and tubing day of surgery.                              You may not have any metal on your body including hair pins, jewelry, and body piercing             Do not wear make-up, lotions, powders, perfumes/cologne, or deodorant  Do not wear nail polish including gel and S&S, artificial/acrylic nails, or any other type of covering on natural nails including finger and toenails. If you have artificial nails, gel coating, etc. that needs to be removed by a nail salon please have this removed prior to surgery or surgery may need to be canceled/ delayed if the surgeon/ anesthesia feels like they are unable to be safely monitored.   Do not shave  48 hours prior to surgery.    Do not bring valuables to the hospital. [facility] IS NOT             RESPONSIBLE   FOR VALUABLES.   Contacts, glasses, or bridgework may not be worn into surgery.   Bring small overnight bag day of surgery.   DO NOT BRING YOUR HOME MEDICATIONS TO THE HOSPITAL. PHARMACY WILL DISPENSE MEDICATIONS LISTED ON YOUR MEDICATION LIST TO YOU DURING YOUR ADMISSION IN THE HOSPITAL!    Patients discharged on the day of surgery will not be allowed to drive home.  Someone NEEDS to stay with you for the first 24 hours after anesthesia.   Special Instructions: Bring a copy of your healthcare power of attorney and living will documents         the day of surgery if you haven't scanned them before.              Please read over the  following fact sheets you were given: IF YOU HAVE QUESTIONS ABOUT YOUR PRE-OP INSTRUCTIONS PLEASE CALL [phone]      Pre-operative 5 CHG Bath Instructions   You can play a key role in reducing the risk of infection after surgery. Your skin needs to be as free of germs as possible. You can reduce the number of germs on your skin by washing with CHG (chlorhexidine gluconate) soap before surgery. CHG is an antiseptic soap that kills germs and continues to kill germs even after washing.   DO NOT use if you have an allergy to chlorhexidine/CHG or antibacterial soaps. If your skin becomes reddened or irritated, stop using the CHG and notify one of our RNs at : [phone].   Please shower with the CHG soap starting 4 days before surgery using the following schedule:     Please keep in mind the following:  DO NOT shave, including legs and underarms, starting the day of your first shower.   You may shave your face at any point before/day of surgery.  Place clean sheets on your bed the day you start using CHG soap. Use a clean washcloth (not used since being washed) for each shower. DO NOT sleep with pets once you start using the CHG.   CHG Shower Instructions:  If you choose to wash your hair and private area, wash first with your normal shampoo/soap.  After you use shampoo/soap, rinse your hair and body thoroughly to remove shampoo/soap residue.  Turn the water OFF  and apply about 3 tablespoons (45 ml) of CHG soap to a CLEAN washcloth.  Apply CHG soap ONLY FROM YOUR NECK DOWN TO YOUR TOES (washing for 3-5 minutes)  DO NOT use CHG soap on face, private areas, open wounds, or sores.  Pay special attention to the area where your surgery is being performed.  If you are having back surgery, having someone wash your back for you may be helpful. Wait 2 minutes after CHG soap is applied, then you may rinse off the CHG soap.  Pat dry with a clean towel  Put on clean clothes/pajamas   If you  choose to wear lotion, please use ONLY the CHG-compatible lotions on the back of this paper.     Additional instructions for the day of surgery: DO NOT APPLY any lotions, deodorants, cologne, or perfumes.   Put on clean/comfortable clothes.  Brush your teeth.  Ask your nurse before applying any prescription medications to the skin.   CHG Compatible Lotions   Aveeno Moisturizing lotion  Cetaphil Moisturizing Cream  Cetaphil Moisturizing Lotion  Clairol Herbal Essence Moisturizing Lotion, Dry Skin  Clairol Herbal Essence Moisturizing Lotion, Extra Dry Skin  Clairol Herbal Essence Moisturizing Lotion, Normal Skin  Curel Age Defying Therapeutic Moisturizing Lotion with Alpha Hydroxy  Curel Extreme Care Body Lotion  Curel Soothing Hands Moisturizing Hand Lotion  Curel Therapeutic Moisturizing Cream, Fragrance-Free  Curel Therapeutic Moisturizing Lotion, Fragrance-Free  Curel Therapeutic Moisturizing Lotion, Original Formula  Eucerin Daily Replenishing Lotion  Eucerin Dry Skin Therapy Plus Alpha Hydroxy Crme  Eucerin Dry Skin Therapy Plus Alpha Hydroxy Lotion  Eucerin Original Crme  Eucerin Original Lotion  Eucerin Plus Crme Eucerin Plus Lotion  Eucerin TriLipid Replenishing Lotion  Keri Anti-Bacterial Hand Lotion  Keri Deep Conditioning Original Lotion Dry Skin Formula Softly Scented  Keri Deep Conditioning Original Lotion, Fragrance Free Sensitive Skin Formula  Keri Lotion Fast Absorbing Fragrance Free Sensitive Skin Formula  Keri Lotion Fast Absorbing Softly Scented Dry Skin Formula  Keri Original Lotion  Keri Skin Renewal Lotion Keri Silky Smooth Lotion  Keri Silky Smooth Sensitive Skin Lotion  Nivea Body Creamy Conditioning Oil  Nivea Body Extra Enriched Lotion  Nivea Body Original Lotion  Nivea Body Sheer Moisturizing Lotion Nivea Crme  Nivea Skin Firming Lotion  NutraDerm 30 Skin Lotion  NutraDerm Skin Lotion  NutraDerm Therapeutic Skin Cream  NutraDerm Therapeutic  Skin Lotion  ProShield Protective Hand Cream  Provon moisturizing lotion   Incentive Spirometer  An incentive spirometer is a tool that can help keep your lungs clear and active. This tool measures how well you are filling your lungs with each breath. Taking long deep breaths may help reverse or decrease the chance of developing breathing (pulmonary) problems (especially infection) following: A long period of time when you are unable to move or be active. BEFORE THE PROCEDURE  If the spirometer includes an indicator to show your best effort, your nurse or respiratory therapist will set it to a desired goal. If possible, sit up straight or lean slightly forward. Try not to slouch. Hold the incentive spirometer in an upright position. INSTRUCTIONS FOR USE  Sit on the edge of your bed if possible, or sit up as far as you can in bed or on a chair. Hold the incentive spirometer in an upright position. Breathe out normally. Place the mouthpiece in your mouth and seal your lips tightly around it. Breathe in slowly and as deeply as possible, raising the piston or the ball toward the  top of the column. Hold your breath for 3-5 seconds or for as long as possible. Allow the piston or ball to fall to the bottom of the column. Remove the mouthpiece from your mouth and breathe out normally. Rest for a few seconds and repeat Steps 1 through 7 at least 10 times every 1-2 hours when you are awake. Take your time and take a few normal breaths between deep breaths. The spirometer may include an indicator to show your best effort. Use the indicator as a goal to work toward during each repetition. After each set of 10 deep breaths, practice coughing to be sure your lungs are clear. If you have an incision (the cut made at the time of surgery), support your incision when coughing by placing a pillow or rolled up towels firmly against it. Once you are able to get out of bed, walk around indoors and cough well. You  may stop using the incentive spirometer when instructed by your caregiver.  RISKS AND COMPLICATIONS Take your time so you do not get dizzy or light-headed. If you are in pain, you may need to take or ask for pain medication before doing incentive spirometry. It is harder to take a deep breath if you are having pain. AFTER USE Rest and breathe slowly and easily. It can be helpful to keep track of a log of your progress. Your caregiver can provide you with a simple table to help with this. If you are using the spirometer at home, follow these instructions: SEEK MEDICAL CARE IF:  You are having difficultly using the spirometer. You have trouble using the spirometer as often as instructed. Your pain medication is not giving enough relief while using the spirometer. You develop fever of 100.5 F (38.1 C) or higher. SEEK IMMEDIATE MEDICAL CARE IF:  You cough up bloody sputum that had not been present before. You develop fever of 102 F (38.9 C) or greater. You develop worsening pain at or near the incision site. MAKE SURE YOU:  Understand these instructions. Will watch your condition. Will get help right away if you are not doing well or get worse. Document Released: 07/19/2006 Document Revised: 05/31/2011 Document Reviewed: 09/19/2006 [facility] Patient Information 2014 [place], [place].   ________________________________________________________________________

## 2023-04-12 ENCOUNTER — Encounter (HOSPITAL_COMMUNITY): Payer: Self-pay

## 2023-04-12 ENCOUNTER — Encounter (HOSPITAL_COMMUNITY)
Admission: RE | Admit: 2023-04-12 | Discharge: 2023-04-12 | Disposition: A | Discharge status: Home / Self Care | Payer: Medicare Other | Source: Ambulatory Visit | Attending: Orthopedic Surgery

## 2023-04-12 ENCOUNTER — Ambulatory Visit: Payer: Medicare Other | Admitting: Nurse Practitioner

## 2023-04-12 ENCOUNTER — Encounter: Payer: Self-pay | Admitting: Nurse Practitioner

## 2023-04-12 ENCOUNTER — Other Ambulatory Visit: Payer: Self-pay

## 2023-04-12 VITALS — BP 159/57 | HR 76 | Ht 65.0 in | Wt 201.0 lb

## 2023-04-12 VITALS — BP 171/72 | HR 77 | Temp 98.5°F | Ht 65.0 in | Wt 195.0 lb

## 2023-04-12 DIAGNOSIS — E119 Type 2 diabetes mellitus without complications: Secondary | ICD-10-CM | POA: Diagnosis not present

## 2023-04-12 DIAGNOSIS — R011 Cardiac murmur, unspecified: Secondary | ICD-10-CM | POA: Insufficient documentation

## 2023-04-12 DIAGNOSIS — E782 Mixed hyperlipidemia: Secondary | ICD-10-CM | POA: Diagnosis not present

## 2023-04-12 DIAGNOSIS — I10 Essential (primary) hypertension: Secondary | ICD-10-CM | POA: Diagnosis not present

## 2023-04-12 DIAGNOSIS — Z01818 Encounter for other preprocedural examination: Secondary | ICD-10-CM | POA: Diagnosis present

## 2023-04-12 DIAGNOSIS — Z01810 Encounter for preprocedural cardiovascular examination: Secondary | ICD-10-CM | POA: Insufficient documentation

## 2023-04-12 DIAGNOSIS — Z01812 Encounter for preprocedural laboratory examination: Secondary | ICD-10-CM | POA: Insufficient documentation

## 2023-04-12 DIAGNOSIS — I2510 Atherosclerotic heart disease of native coronary artery without angina pectoris: Secondary | ICD-10-CM | POA: Insufficient documentation

## 2023-04-12 LAB — SURGICAL PCR SCREEN
MRSA, PCR: NEGATIVE
Staphylococcus aureus: NEGATIVE

## 2023-04-12 LAB — COMPREHENSIVE METABOLIC PANEL
ALT: 40 U/L (ref 0–44)
AST: 53 U/L — ABNORMAL HIGH (ref 15–41)
Albumin: 3.6 g/dL (ref 3.5–5.0)
Alkaline Phosphatase: 147 U/L — ABNORMAL HIGH (ref 38–126)
Anion gap: 8 (ref 5–15)
BUN: 17 mg/dL (ref 8–23)
CO2: 21 mmol/L — ABNORMAL LOW (ref 22–32)
Calcium: 9.1 mg/dL (ref 8.9–10.3)
Chloride: 110 mmol/L (ref 98–111)
Creatinine, Ser: 1.04 mg/dL — ABNORMAL HIGH (ref 0.44–1.00)
GFR, Estimated: 55 mL/min — ABNORMAL LOW (ref >60)
Glucose, Bld: 116 mg/dL — ABNORMAL HIGH (ref 70–99)
Potassium: 4.7 mmol/L (ref 3.5–5.1)
Sodium: 139 mmol/L (ref 135–145)
Total Bilirubin: 1 mg/dL (ref 0.0–1.2)
Total Protein: 7 g/dL (ref 6.5–8.1)

## 2023-04-12 LAB — CBC
HCT: 32 % — ABNORMAL LOW (ref 36.0–46.0)
Hemoglobin: 10.4 g/dL — ABNORMAL LOW (ref 12.0–15.0)
MCH: 29.1 pg (ref 26.0–34.0)
MCHC: 32.5 g/dL (ref 30.0–36.0)
MCV: 89.6 fL (ref 80.0–100.0)
Platelets: 204 10*3/uL (ref 150–400)
RBC: 3.57 MIL/uL — ABNORMAL LOW (ref 3.87–5.11)
RDW: 20.8 % — ABNORMAL HIGH (ref 11.5–15.5)
WBC: 5.8 10*3/uL (ref 4.0–10.5)
nRBC: 0 % (ref 0.0–0.2)

## 2023-04-12 LAB — HEMOGLOBIN A1C
Hgb A1c MFr Bld: 5.2 % (ref 4.8–5.6)
Mean Plasma Glucose: 102.54 mg/dL

## 2023-04-12 LAB — TYPE AND SCREEN
ABO/RH(D): A POS
Antibody Screen: NEGATIVE

## 2023-04-12 LAB — GLUCOSE, CAPILLARY: Glucose-Capillary: 121 mg/dL — ABNORMAL HIGH (ref 70–99)

## 2023-04-12 MED ORDER — NITROGLYCERIN 0.4 MG SL SUBL: 0.4000 mg | SUBLINGUAL_TABLET | SUBLINGUAL | 3 refills | Status: AC | PRN

## 2023-04-12 NOTE — Patient Instructions (Addendum)
Medication Instructions:  Your physician has recommended you make the following change in your medication:  Please restart Aspirin 81mg  after surgery   Labwork: None   Testing/Procedures: Your physician has requested that you have an echocardiogram. Echocardiography is a painless test that uses sound waves to create images of your heart. It provides your doctor with information about the size and shape of your heart and how well your heart's chambers and valves are working. This procedure takes approximately one hour. There are no restrictions for this procedure. Please do NOT wear cologne, perfume, aftershave, or lotions (deodorant is allowed). Please arrive 15 minutes prior to your appointment time.  Please note: We ask at that you not bring children with you during ultrasound (echo/ vascular) testing. Due to room size and safety concerns, children are not allowed in the ultrasound rooms during exams. Our front office staff cannot provide observation of children in our lobby area while testing is being conducted. An adult accompanying a patient to their appointment will only be allowed in the ultrasound room at the discretion of the ultrasound technician under special circumstances. We apologize for any inconvenience.  Follow-Up: Your physician recommends that you schedule a follow-up appointment in: 6 months   Any Other Special Instructions Will Be Listed Below (If Applicable).  If you need a refill on your cardiac medications before your next appointment, please call your pharmacy.

## 2023-04-12 NOTE — Progress Notes (Addendum)
For Anesthesia: PCP - Toma Deiters, MD  Cardiologist - Jonelle Sidle, MD Clearance: 04/12/23 Diana Patterson, N  Bowel Prep reminder:  Chest x-ray -  EKG - 07/02/22 Stress Test -  ECHO - 08/25/15 Cardiac Cath - 08/23/15 Pacemaker/ICD device last checked: Pacemaker orders received: Device Rep notified:  Spinal Cord Stimulator: N/A  Sleep Study - Yes CPAP - NO  Fasting Blood Sugar - N/A Checks Blood Sugar _____ times a day Date and result of last Hgb A1c-  Last dose of GLP1 agonist- N/A GLP1 instructions:   Last dose of SGLT-2 inhibitors- N/A SGLT-2 instructions:   Blood Thinner Instructions: N/A Aspirin Instructions: Last Dose:  Activity level: Can go up a flight of stairs and activities of daily living without stopping and without chest pain and/or shortness of breath   Able to exercise without chest pain and/or shortness of breath  Anesthesia review: Hx: NSTEMI,CAD,CKD 3,HTN,OSA(NO CPAP)transient ischemic attacks.  Patient denies shortness of breath, fever, cough and chest pain at PAT appointment   Patient verbalized understanding of instructions that were given to them at the PAT appointment. Patient was also instructed that they will need to review over the PAT instructions again at home before surgery.

## 2023-04-12 NOTE — H&P (Signed)
TOTAL HIP ADMISSION H&P  Patient is admitted for left total hip arthroplasty.  Subjective:  Chief Complaint: Left hip pain  HPI: Diana Patterson, 78 y.o. female, has a history of pain and functional disability in the left hip due to arthritis and patient has failed non-surgical conservative treatments for greater than 12 weeks to include NSAID's and/or analgesics, corticosteriod injections, and activity modification. Onset of symptoms was gradual, starting >10 years ago with gradually worsening course since that time. The patient noted no past surgery on the left hip. Patient currently rates pain in the left hip at 8 out of 10 with activity. Patient has night pain, worsening of pain with activity and weight bearing, pain that interfers with activities of daily living, and pain with passive range of motion. Patient has evidence of  bone-on-bone arthritis with large marginal osteophyte formation and subchondral sclerosis  by imaging studies. This condition presents safety issues increasing the risk of falls.There is no current active infection.  Patient Active Problem List   Diagnosis Date Noted   OA (osteoarthritis) of hip 02/09/2021   Primary osteoarthritis of right hip 02/09/2021   Iron deficiency anemia 01/10/2020   Melena 01/10/2020   GI bleed 07/19/2019   History of total bilateral knee replacement 01/25/2017   Primary osteoarthritis of both knees 01/25/2017   ST elevation myocardial infarction (STEMI) of inferior wall, initial episode of care (HCC) 08/23/2015   ST elevation myocardial infarction (STEMI) of inferior wall (HCC) 08/23/2015   Preoperative evaluation to rule out surgical contraindication 05/17/2012   CAD (coronary artery disease), native coronary artery 12/18/2010   Benign essential hypertension 12/18/2010   Type 2 diabetes mellitus (HCC) 12/18/2010   Dyslipidemia 12/18/2010   CKD (chronic kidney disease) stage 3, GFR 30-59 ml/min (HCC) 12/18/2010    Past Medical  History:  Diagnosis Date   Arthritis    CAD (coronary artery disease)    DES to circumflex June 2017, moderate residual LAD disease   CKD (chronic kidney disease) stage 3, GFR 30-59 ml/min (HCC)    Essential hypertension    Mixed hyperlipidemia    Muscle cramps at night    NSTEMI (non-ST elevated myocardial infarction) (HCC) 2016   Probable type II event, negative Cardiolite 2012   Sleep apnea    no onger uses cpap   TIA (transient ischemic attack)     Past Surgical History:  Procedure Laterality Date   CARDIAC CATHETERIZATION N/A 08/23/2015   Procedure: Left Heart Cath and Coronary Angiography;  Surgeon: Marykay Lex, MD;  Location: [facility] INVASIVE CV LAB;  Service: Cardiovascular;  Laterality: N/A;   CARDIAC CATHETERIZATION N/A 08/23/2015   Procedure: Coronary Stent Intervention;  Surgeon: Marykay Lex, MD;  Location: [facility] INVASIVE CV LAB;  Service: Cardiovascular;  Laterality: N/A;   CHOLECYSTECTOMY     EYE SURGERY  1990s   GIVENS CAPSULE STUDY N/A 04/01/2020   Procedure: GIVENS CAPSULE STUDY;  Surgeon: Dolores Frame, MD;  Location: [facility] ENDO SUITE;  Service: Gastroenterology;  Laterality: N/A;  7:30   KNEE ARTHROPLASTY Bilateral    TOTAL HIP ARTHROPLASTY Right 02/09/2021   Procedure: TOTAL HIP ARTHROPLASTY ANTERIOR APPROACH;  Surgeon: Ollen Gross, MD;  Location: [facility] ORS;  Service: Orthopedics;  Laterality: Right;   Tubal pregnancy removal      Prior to Admission medications   Medication Sig Start Date End Date Taking? Authorizing Provider  acetaminophen (TYLENOL) 650 MG CR tablet Take 1,300 mg by mouth in the morning and at bedtime.   Yes  [provider]  atorvastatin (LIPITOR) 80 MG tablet Take 1 tablet (80 mg total) by mouth daily at 6 PM. 08/26/15  Yes Robbie Lis M, PA-C  Choline Fenofibrate 135 MG capsule Take 135 mg by mouth daily in the afternoon.   Yes [provider]  clotrimazole-betamethasone (LOTRISONE) cream Apply 1 application   topically daily as needed (irritation).   Yes [provider]  Dexlansoprazole (DEXILANT) 30 MG capsule Take 30 mg by mouth daily with breakfast.   Yes [provider]  folic acid (FOLVITE) 1 MG tablet Take 1 mg by mouth See admin instructions. Take 1 mg by mouth daily except for Saturday   Yes [provider]  furosemide (LASIX) 20 MG tablet Take 20 mg by mouth daily with breakfast.   Yes [provider]  iron polysaccharides (NIFEREX) 150 MG capsule Take 150 mg by mouth in the morning, at noon, and at bedtime.   Yes [provider]  levocetirizine (XYZAL) 5 MG tablet Take 5 mg by mouth every evening.   Yes [provider]  meclizine (ANTIVERT) 25 MG tablet Take 25 mg by mouth every 6 (six) hours as needed for dizziness. 11/16/20  Yes [provider]  meloxicam (MOBIC) 15 MG tablet Take 15 mg by mouth daily.   Yes [provider]  metaxalone (SKELAXIN) 800 MG tablet Take 800 mg by mouth 2 (two) times daily.    Yes [provider]  methotrexate (RHEUMATREX) 2.5 MG tablet Take 20 mg by mouth every Saturday.   Yes [provider]  metoprolol tartrate (LOPRESSOR) 50 MG tablet Take 25 mg by mouth daily.   Yes [provider]  montelukast (SINGULAIR) 10 MG tablet Take 10 mg by mouth daily in the afternoon.   Yes [provider]  ondansetron (ZOFRAN-ODT) 4 MG disintegrating tablet Take 4 mg by mouth every 4 (four) hours as needed for nausea/vomiting. 11/02/20  Yes [provider]  potassium chloride SA (KLOR-CON M) 20 MEQ tablet Take 20 mEq by mouth daily. 04/27/21  Yes [provider]  predniSONE (DELTASONE) 10 MG tablet Take 10 mg by mouth daily. 04/01/23  Yes [provider]  promethazine-dextromethorphan (PROMETHAZINE-DM) 6.25-15 MG/5ML syrup Take 5 mLs by mouth 4 (four) times daily as needed for cough. 04/01/23  Yes [provider]  traMADol (ULTRAM) 50 MG tablet  Take 50-100 mg by mouth 2 (two) times daily as needed for moderate pain (pain score 4-6). 12/20/22  Yes [provider]  valACYclovir (VALTREX) 500 MG tablet Take 500 mg by mouth daily.   Yes [provider]  methocarbamol (ROBAXIN) 500 MG tablet Take 1 tablet (500 mg total) by mouth every 6 (six) hours as needed for muscle spasms. 02/11/21   Henna Derderian, Lyn Hollingshead, PA  nitroGLYCERIN (NITROSTAT) 0.4 MG SL tablet Place 1 tablet (0.4 mg total) under the tongue every 5 (five) minutes x 3 doses as needed for chest pain (if no relief after 2nd dose, proceed to ED or call 911). 04/12/23   Sharlene Dory, NP    Allergies  Allergen Reactions   Morphine And Codeine Other (See Comments)    Drowsiness    Social History   Socioeconomic History   Marital status: Widowed    Spouse name: Not on file   Number of children: Not on file   Years of education: Not on file   Highest education level: Not on file  Occupational History   Not on file  Tobacco Use  Smoking status: Former    Current packs/day: 0.00    Average packs/day: 1 pack/day for 20.0 years (20.0 ttl pk-yrs)    Types: Cigarettes    Start date: 03/22/1958    Quit date: 03/22/1978    Years since quitting: 45.0   Smokeless tobacco: Never  Vaping Use   Vaping status: Never Used  Substance and Sexual Activity   Alcohol use: No    Alcohol/week: 0.0 standard drinks of alcohol   Drug use: No   Sexual activity: Not on file  Other Topics Concern   Not on file  Social History Narrative   Has 5 children   Social Drivers of Health   Financial Resource Strain: Low Risk  (04/15/2021)   Received from [facility], [facility]   Overall Financial Resource Strain (CARDIA)    Difficulty of Paying Living Expenses: Not hard at all  Food Insecurity: No Food Insecurity (04/15/2021)   Received from [facility], [facility]   Hunger Vital Sign    Worried About Running Out of Food in the Last Year: Never true    Ran Out  of Food in the Last Year: Never true  Transportation Needs: No Transportation Needs (04/15/2021)   Received from [facility], [facility]   [facility] - Transportation    Lack of Transportation (Medical): No    Lack of Transportation (Non-Medical): No  Physical Activity: Inactive (04/15/2021)   Received from [facility], [facility]   Exercise Vital Sign    Days of Exercise per Week: 0 days    Minutes of Exercise per Session: 0 min  Stress: No Stress Concern Present (04/15/2021)   Received from [facility], [facility] of Occupational Health - Occupational Stress Questionnaire    Feeling of Stress : Not at all  Social Connections: Moderately Isolated (04/15/2021)   Received from [facility], [facility]   Social Connection and Isolation Panel [NHANES]    Frequency of Communication with Friends and Family: More than three times a week    Frequency of Social Gatherings with Friends and Family: Twice a week    Attends Religious Services: More than 4 times per year    Active Member of [employer] or Organizations: No    Attends Banker Meetings: Never    Marital Status: Widowed  Intimate Partner Violence: Not At Risk (04/15/2021)   Received from [facility], [facility]   Humiliation, Afraid, Rape, and Kick questionnaire    Fear of Current or Ex-Partner: No    Emotionally Abused: No    Physically Abused: No    Sexually Abused: No    Tobacco Use: Medium Risk (04/12/2023)   Patient History    Smoking Tobacco Use: Former    Smokeless Tobacco Use: Never    Passive Exposure: Not on file   Social History   Substance and Sexual Activity  Alcohol Use No   Alcohol/week: 0.0 standard drinks of alcohol    Family History  Problem Relation Age of Onset   Heart attack Father    Diabetes Sister     Review of Systems  Constitutional:  Negative for chills and fever.  HENT:  Negative for congestion, sore throat and tinnitus.    Eyes:  Negative for double vision, photophobia and pain.  Respiratory:  Negative for cough, shortness of breath and wheezing.   Cardiovascular:  Negative for chest pain, palpitations and orthopnea.  Gastrointestinal:  Negative for heartburn, nausea and vomiting.  Genitourinary:  Negative for dysuria, frequency and urgency.  Musculoskeletal:  Positive for joint pain.  Neurological:  Negative for dizziness, weakness and headaches.     Objective:  Physical Exam: Well nourished and well developed.  General: Alert and oriented x3, cooperative and pleasant, no acute distress.  Head: normocephalic, atraumatic, neck supple.  Eyes: EOMI.  Musculoskeletal:  Left Hip Exam:  No tenderness to palpation at the greater trochanter.  Mild discomfort with active and passive hip flexion and abduction.  Range of motion: flexion 120 degrees, internal rotation 20 degrees, external rotation 30 degrees, abduction 30 degrees.  Pain with provocative testing of the left hip.   Calves soft and nontender. Motor function intact in LE. Strength 5/5 LE bilaterally.  Neuro: Distal pulses 2+. Sensation to light touch intact in LE.  Vital signs in last 24 hours: Temp:  [98.5 F (36.9 C)] 98.5 F (36.9 C) (01/21 1159) Pulse Rate:  [76-77] 77 (01/21 1159) BP: (160-178)/(67-80) 171/72 (01/21 1216) SpO2:  [95 %-98 %] 95 % (01/21 1159) Weight:  [88.5 kg-91.2 kg] 88.5 kg (01/21 1159)  Imaging Review Plain radiographs demonstrate severe degenerative joint disease of the left hip. The bone quality appears to be adequate for age and reported activity level.  Assessment/Plan:  End stage arthritis, left hip  The patient history, physical examination, clinical judgement of the provider and imaging studies are consistent with end stage degenerative joint disease of the left hip and total hip arthroplasty is deemed medically necessary. The treatment options including medical management, injection therapy, arthroscopy  and arthroplasty were discussed at length. The risks and benefits of total hip arthroplasty were presented and reviewed. The risks due to aseptic loosening, infection, stiffness, dislocation/subluxation, thromboembolic complications and other imponderables were discussed. The patient acknowledged the explanation, agreed to proceed with the plan and consent was signed. Patient is being admitted for inpatient treatment for surgery, pain control, PT, OT, prophylactic antibiotics, VTE prophylaxis, progressive ambulation and ADLs and discharge planning.The patient is planning to be discharged  home .   Patient's anticipated LOS is less than 2 midnights, meeting these requirements: - Lives within 1 hour of care - Has a competent adult at home to recover with post-op recover - NO history of  - Chronic pain requiring opiods  - Diabetes  - Heart failure  - Heart attack  - Stroke  - DVT/VTE  - Cardiac arrhythmia  - Respiratory Failure/COPD  - Renal failure  - Advanced Liver disease  Therapy Plans: HEP Disposition: Home with family Planned DVT Prophylaxis: Aspirin 81 mg BID DME Needed: None PCP: Dione Booze, MD (clearance received) Cardiologist: Nona Dell, MD (clearance received) TXA: IV Allergies: Morphine Anesthesia Concerns: None BMI: 35 Last HgbA1c: 5.4% (09/26/2022) Pain Regimen: Tramadol & hydrocodone Pharmacy: [facility] (have brought to room)  Other: - Hx of anemia that resulted in right THA being cancelled multiples times in 2022. Most recent Hgb was 10.4 as of 03/29/2023. Dr. Lequita Halt aware, patient is agreeable to probable transfusion if needed postop. - PMH: CAD with stent placement - States hydrocodone makes her feel loopy, as does all strong pain medication. We will rely mainly on tramadol and tylenol for pain control.    - Patient was instructed on what medications to stop prior to surgery. - Follow-up visit in 2 weeks with Dr. Lequita Halt - Begin physical therapy following  surgery - Pre-operative lab work as pre-surgical testing - Prescriptions will be provided in hospital at  time of discharge  Arther Abbott, PA-C Orthopedic Surgery [facility] [place]

## 2023-04-12 NOTE — Progress Notes (Signed)
Cardiology Office Note:  .   Date:  04/12/2023  ID:  Diana Patterson, DOB 28-Apr-1945, MRN [id number] PCP: Diana Deiters, MD  [facility] Providers Cardiologist:  Diana Dell, MD    History of Present Illness: Diana Patterson   Diana Patterson is a 78 y.o. female with a PMH of CAD, mixed hyperlipidemia, hypertension, CKD stage IIIb, today for preoperative cardiovascular risk assessment.  Previous cardiovascular history of DES to circumflex in 2017, was noted to have moderate residual LAD disease that was recommended to be medically managed.  Last seen by Dr. Diona Patterson on November 23, 2022.  She was pending left hip total arthroplasty with Dr. Lequita Patterson in October 2024.  She was overall doing well from a cardiac perspective.  Our office has been recently contacted regarding preoperative cardiovascular clearance.  She is pending left total hip arthroplasty for surgery date on April 25, 2023.  Surgeon will be Dr. Lequita Patterson with [facility] Patterson.  She presents today for preoperative cardiovascular risk assessment.  She states she is doing fairly well. Denies any chest pain, shortness of breath, palpitations, syncope, presyncope, dizziness, orthopnea, PND, swelling or significant weight changes, acute bleeding, or claudication.  Says she has been off her baby aspirin due to her upcoming surgery, says she plans to restart this after her surgery.  Her BP is up today in the office as she says she has not taken her medications this morning.  ROS: Negative.  See HPI.  Studies Reviewed: .    Carotid Doppler 10/2018: Minimal plaque bilaterally, no hemodynamically significant plaque in either bilateral internal carotid artery.  Lexiscan 10/2015: No diagnostic ST segment changes to indicate ischemia. Small, mild intensity, apical anterior defect that is predominantly fixed and most consistent with variable soft tissue attenuation. No large degree of anterior ischemia noted. This is a low risk  study. Nuclear stress EF: 54%.  Echocardiogram 08/2015: Study Conclusions   - Left ventricle: The cavity size was normal. Wall thickness was    increased in a pattern of mild LVH. Systolic function was normal.    The estimated ejection fraction was in the range of 60% to 65%.    Wall motion was normal; there were no regional wall motion    abnormalities. Doppler parameters are consistent with abnormal    left ventricular relaxation (grade 1 diastolic dysfunction).  - Mitral valve: Mildly calcified annulus.  LHC 08/2015:  Prox Cx to Mid Cx lesion, 90% stenosed. Post intervention aspiration thrombectomy followed by Promus Premier DES 3.5 mm x 12 mm (3.75 mm) , there is a 0% residual stenosis. Prox LAD-2 lesion, 50-75% stenosed. The left ventricular systolic function is normal. Elevated LVEDP     Likely culprit lesion was the thrombotic stenosis of the circumflex. It would appear that there was reperfusion after heparin and aspirin was given in the emergency room. This would be consistent with her improved chest pain upon arrival.   Successful PCI of the proximal dominant Circumflex. There is residual disease in the proximal-mid LAD prior to D1. This does not appear to be flow-limiting, however would probably benefit from being investigated. I would determine course of action based on her symptoms. If she is a symptomatically, could consider discharge with plans for outpatient Myoview to basket the LAD, otherwise if she has symptoms of angina following this PCI, I would proceed with staged PCI of the LAD prior to her discharge.   Plan: Admit to ICU.  Aggrastat for 12 hours Aspirin plus Brilinta High-dose statin, and  continue home beta blocker Sliding scale insulin, hold metformin Home medications     If she does well, could anticipate fast-track discharge. Otherwise would consider staged PCI of the LAD if symptoms occur. If not would consider outpatient Myoview in 4-6 weeks. She can  follow back up again with Dr. Diona Patterson  Risk Assessment/Calculations:    HYPERTENSION CONTROL Vitals:   04/12/23 0918 04/12/23 0945  BP: (!) 160/80 (!) 159/57    The patient's blood pressure is elevated above target today.  In order to address the patient's elevated BP: Blood pressure will be monitored at home to determine if medication changes need to be made.; Follow up with general cardiology has been recommended.          Physical Exam:   VS:  BP (!) 159/57 (BP Location: Right Arm, Patient Position: Sitting, Cuff Size: Normal)   Pulse 76   Ht 5\' 5"  (1.651 m)   Wt 201 lb (91.2 kg)   SpO2 98%   BMI 33.45 kg/m    Wt Readings from Last 3 Encounters:  04/12/23 195 lb (88.5 kg)  04/12/23 201 lb (91.2 kg)  12/29/22 195 lb (88.5 kg)    GEN: Obese, 78 year old female in no acute distress NECK: No JVD; No carotid bruits CARDIAC: S1/S2, RRR, Grade 1/6 murmur, no rubs or gallops.  RESPIRATORY:  Clear to auscultation without rales, wheezing or rhonchi  ABDOMEN: Soft, non-tender, non-distended EXTREMITIES:  No edema; No deformity   ASSESSMENT AND PLAN: .    Pre-operative cardiovascular risk assessment [name]'s perioperative risk of a major cardiac event is 0.9% according to the Revised Cardiac Risk Index (RCRI).  Therefore, she is at low risk for perioperative complications.   Her functional capacity is fair at 4.64 METs according to the Duke Activity Status Index (DASI). Recommendations: According to ACC/AHA guidelines, no further cardiovascular testing needed.  The patient may proceed to surgery at acceptable risk.   Antiplatelet and/or Anticoagulation Recommendations: She is not taking any antiplatelet/blood thinner at this time that needs to be held prior to procedure.  I recommended starting aspirin 81 mg daily due to her history of CAD, however patient states she will start this after her procedure. Will route note to requesting party.   2.  CAD Stable with no anginal  symptoms. No indication for ischemic evaluation.  Continue atorvastatin, Lopressor, and will provide refill of nitroglycerin as needed.  Discussed starting aspirin 81 mg daily, patient politely declines to start this at this time and states will restart this after her upcoming procedure. Heart healthy diet and regular cardiovascular exercise encouraged.  Care and ED precautions discussed.  3.  Hypertension Blood pressure elevated in office today as she has not taken her BP medications prior to coming to her office visit.  Home BP readings are well-controlled per her report.  No medication changes at this time. Discussed to monitor BP at home at least 2 hours after medications and sitting for 5-10 minutes.  Discussed to notify our office if SBP is consistently greater than 140.  She verbalized understanding. Heart healthy diet and regular cardiovascular exercise encouraged.   4.  Mixed hyperlipidemia LDL 59 in early January 2025.  She is currently at goal.  Continue atorvastatin. Heart healthy diet and regular cardiovascular exercise encouraged.   5.  Heart murmur Grade 1/6 heart murmur noted on exam.  Last echocardiogram in 2017 did not show any significant valvular abnormalities.  Will update echocardiogram at this time.   Dispo: Follow-up with  Dr. Diona Patterson or APP in 6 months or sooner anything changes.  Signed, Sharlene Dory, NP

## 2023-04-13 NOTE — Progress Notes (Signed)
Anesthesia Chart Review   Case: [id number] Date/Time: 04/25/23 1330   Procedure: TOTAL HIP ARTHROPLASTY ANTERIOR APPROACH (Left: Hip)   Anesthesia type: Choice   Pre-op diagnosis: Left Hip Osteoarthritis   Location: [facility] 09 / [facility]   Surgeons: Ollen Gross, MD       DISCUSSION:78 y.o. former smoker with h/o sleep apnea, HTN, CKD Stage III, CAD (DES 2017), left hip OA scheduled for above procedure 04/25/23 with Dr. Ollen Gross.    Per cardiology preoperative evaluation 04/12/23, "Ms. Fleece's perioperative risk of a major cardiac event is 0.9% according to the Revised Cardiac Risk Index (RCRI).  Therefore, she is at low risk for perioperative complications.   Her functional capacity is fair at 4.64 METs according to the Duke Activity Status Index (DASI). Recommendations: According to ACC/AHA guidelines, no further cardiovascular testing needed.  The patient may proceed to surgery at acceptable risk.   Antiplatelet and/or Anticoagulation Recommendations: She is not taking any antiplatelet/blood thinner at this time that needs to be held prior to procedure.  I recommended starting aspirin 81 mg daily due to her history of CAD, however patient states she will start this after her procedure. Will route note to requesting party. "   VS: BP (!) 171/72   Pulse 77   Temp 36.9 C (Oral)   Ht 5\' 5"  (1.651 m)   Wt 88.5 kg   SpO2 95%   BMI 32.45 kg/m   PROVIDERS: Toma Deiters, MD is PCP   Cardiologist - Jonelle Sidle, MD  LABS: Labs reviewed: Acceptable for surgery. (all labs ordered are listed, but only abnormal results are displayed)  Labs Reviewed  CBC - Abnormal; Notable for the following components:      Result Value   RBC 3.57 (*)    Hemoglobin 10.4 (*)    HCT 32.0 (*)    RDW 20.8 (*)    All other components within normal limits  COMPREHENSIVE METABOLIC PANEL - Abnormal; Notable for the following components:   CO2 21 (*)    Glucose, Bld 116 (*)    Creatinine,  Ser 1.04 (*)    AST 53 (*)    Alkaline Phosphatase 147 (*)    GFR, Estimated 55 (*)    All other components within normal limits  GLUCOSE, CAPILLARY - Abnormal; Notable for the following components:   Glucose-Capillary 121 (*)    All other components within normal limits  SURGICAL PCR SCREEN  HEMOGLOBIN A1C  TYPE AND SCREEN     IMAGES:   EKG:   CV:  Past Medical History:  Diagnosis Date   Arthritis    CAD (coronary artery disease)    DES to circumflex June 2017, moderate residual LAD disease   CKD (chronic kidney disease) stage 3, GFR 30-59 ml/min (HCC)    Essential hypertension    Mixed hyperlipidemia    Muscle cramps at night    NSTEMI (non-ST elevated myocardial infarction) (HCC) 2016   Probable type II event, negative Cardiolite 2012   Sleep apnea    no onger uses cpap   TIA (transient ischemic attack)     Past Surgical History:  Procedure Laterality Date   CARDIAC CATHETERIZATION N/A 08/23/2015   Procedure: Left Heart Cath and Coronary Angiography;  Surgeon: Marykay Lex, MD;  Location: [facility] INVASIVE CV LAB;  Service: Cardiovascular;  Laterality: N/A;   CARDIAC CATHETERIZATION N/A 08/23/2015   Procedure: Coronary Stent Intervention;  Surgeon: Marykay Lex, MD;  Location: [facility] INVASIVE CV  LAB;  Service: Cardiovascular;  Laterality: N/A;   CHOLECYSTECTOMY     EYE SURGERY  1990s   GIVENS CAPSULE STUDY N/A 04/01/2020   Procedure: GIVENS CAPSULE STUDY;  Surgeon: Dolores Frame, MD;  Location: [facility];  Service: Gastroenterology;  Laterality: N/A;  7:30   KNEE ARTHROPLASTY Bilateral    TOTAL HIP ARTHROPLASTY Right 02/09/2021   Procedure: TOTAL HIP ARTHROPLASTY ANTERIOR APPROACH;  Surgeon: Ollen Gross, MD;  Location: [facility];  Service: Orthopedics;  Laterality: Right;   Tubal pregnancy removal      MEDICATIONS:  acetaminophen (TYLENOL) 650 MG CR tablet   atorvastatin (LIPITOR) 80 MG tablet   Choline Fenofibrate 135 MG capsule    clotrimazole-betamethasone (LOTRISONE) cream   Dexlansoprazole (DEXILANT) 30 MG capsule   folic acid (FOLVITE) 1 MG tablet   furosemide (LASIX) 20 MG tablet   iron polysaccharides (NIFEREX) 150 MG capsule   levocetirizine (XYZAL) 5 MG tablet   meclizine (ANTIVERT) 25 MG tablet   meloxicam (MOBIC) 15 MG tablet   metaxalone (SKELAXIN) 800 MG tablet   methocarbamol (ROBAXIN) 500 MG tablet   methotrexate (RHEUMATREX) 2.5 MG tablet   metoprolol tartrate (LOPRESSOR) 50 MG tablet   montelukast (SINGULAIR) 10 MG tablet   nitroGLYCERIN (NITROSTAT) 0.4 MG SL tablet   ondansetron (ZOFRAN-ODT) 4 MG disintegrating tablet   potassium chloride SA (KLOR-CON M) 20 MEQ tablet   predniSONE (DELTASONE) 10 MG tablet   promethazine-dextromethorphan (PROMETHAZINE-DM) 6.25-15 MG/5ML syrup   traMADol (ULTRAM) 50 MG tablet   valACYclovir (VALTREX) 500 MG tablet   No current facility-administered medications for this encounter.     Jodell Cipro Ward, PA-C [facility] [phone]

## 2023-04-14 DIAGNOSIS — D573 Sickle-cell trait: Secondary | ICD-10-CM | POA: Diagnosis not present

## 2023-04-14 DIAGNOSIS — N1832 Chronic kidney disease, stage 3b: Secondary | ICD-10-CM | POA: Diagnosis not present

## 2023-04-14 DIAGNOSIS — D631 Anemia in chronic kidney disease: Secondary | ICD-10-CM | POA: Diagnosis not present

## 2023-04-18 ENCOUNTER — Telehealth: Payer: Self-pay | Admitting: Cardiology

## 2023-04-18 NOTE — Telephone Encounter (Signed)
Checking percert on the following patient for   ECHO - scheduled for 04/19/2023 in the [facility].

## 2023-04-19 ENCOUNTER — Ambulatory Visit: Payer: Medicare Other | Attending: Nurse Practitioner

## 2023-04-19 DIAGNOSIS — R011 Cardiac murmur, unspecified: Secondary | ICD-10-CM | POA: Diagnosis not present

## 2023-04-20 LAB — ECHOCARDIOGRAM COMPLETE
AV Mean grad: 8 mm[Hg]
AV Peak grad: 15.8 mm[Hg]
Ao pk vel: 1.99 m/s
Area-P 1/2: 2.45 cm2
Calc EF: 61.7 %
S' Lateral: 1.9 cm
Single Plane A2C EF: 62.4 %
Single Plane A4C EF: 61.3 %

## 2023-04-25 ENCOUNTER — Other Ambulatory Visit: Payer: Self-pay

## 2023-04-25 ENCOUNTER — Ambulatory Visit (HOSPITAL_COMMUNITY): Payer: Medicare Other

## 2023-04-25 ENCOUNTER — Encounter (HOSPITAL_COMMUNITY): Payer: Self-pay | Admitting: Orthopedic Surgery

## 2023-04-25 ENCOUNTER — Observation Stay (HOSPITAL_COMMUNITY)
Admission: RE | Admit: 2023-04-25 | Discharge: 2023-04-26 | Disposition: A | Discharge status: Home / Self Care | Payer: Medicare Other | Attending: Orthopedic Surgery | Admitting: Orthopedic Surgery

## 2023-04-25 ENCOUNTER — Ambulatory Visit (HOSPITAL_COMMUNITY): Payer: Medicare Other | Admitting: Physician Assistant

## 2023-04-25 ENCOUNTER — Encounter (HOSPITAL_COMMUNITY)
Admission: RE | Disposition: A | Discharge status: Home / Self Care | Payer: Self-pay | Source: Home / Self Care | Attending: Orthopedic Surgery

## 2023-04-25 ENCOUNTER — Ambulatory Visit (HOSPITAL_COMMUNITY): Payer: Medicare Other | Admitting: Certified Registered"

## 2023-04-25 ENCOUNTER — Observation Stay (HOSPITAL_COMMUNITY): Payer: Medicare Other

## 2023-04-25 DIAGNOSIS — M1612 Unilateral primary osteoarthritis, left hip: Secondary | ICD-10-CM | POA: Diagnosis not present

## 2023-04-25 DIAGNOSIS — Z96641 Presence of right artificial hip joint: Secondary | ICD-10-CM | POA: Insufficient documentation

## 2023-04-25 DIAGNOSIS — I129 Hypertensive chronic kidney disease with stage 1 through stage 4 chronic kidney disease, or unspecified chronic kidney disease: Secondary | ICD-10-CM | POA: Diagnosis not present

## 2023-04-25 DIAGNOSIS — N183 Chronic kidney disease, stage 3 unspecified: Secondary | ICD-10-CM | POA: Insufficient documentation

## 2023-04-25 DIAGNOSIS — Z79899 Other long term (current) drug therapy: Secondary | ICD-10-CM | POA: Insufficient documentation

## 2023-04-25 DIAGNOSIS — I2510 Atherosclerotic heart disease of native coronary artery without angina pectoris: Secondary | ICD-10-CM | POA: Diagnosis not present

## 2023-04-25 DIAGNOSIS — Z87891 Personal history of nicotine dependence: Secondary | ICD-10-CM | POA: Insufficient documentation

## 2023-04-25 DIAGNOSIS — Z8673 Personal history of transient ischemic attack (TIA), and cerebral infarction without residual deficits: Secondary | ICD-10-CM | POA: Diagnosis not present

## 2023-04-25 DIAGNOSIS — Z471 Aftercare following joint replacement surgery: Secondary | ICD-10-CM | POA: Diagnosis not present

## 2023-04-25 DIAGNOSIS — Z96643 Presence of artificial hip joint, bilateral: Secondary | ICD-10-CM | POA: Diagnosis not present

## 2023-04-25 DIAGNOSIS — E785 Hyperlipidemia, unspecified: Secondary | ICD-10-CM | POA: Diagnosis not present

## 2023-04-25 DIAGNOSIS — E119 Type 2 diabetes mellitus without complications: Secondary | ICD-10-CM

## 2023-04-25 DIAGNOSIS — E1122 Type 2 diabetes mellitus with diabetic chronic kidney disease: Secondary | ICD-10-CM | POA: Diagnosis not present

## 2023-04-25 DIAGNOSIS — Z96653 Presence of artificial knee joint, bilateral: Secondary | ICD-10-CM | POA: Diagnosis not present

## 2023-04-25 DIAGNOSIS — M169 Osteoarthritis of hip, unspecified: Principal | ICD-10-CM | POA: Diagnosis present

## 2023-04-25 HISTORY — PX: TOTAL HIP ARTHROPLASTY: SHX124

## 2023-04-25 SURGERY — ARTHROPLASTY, HIP, TOTAL, ANTERIOR APPROACH
Anesthesia: Spinal | Site: Hip | Laterality: Left

## 2023-04-25 MED ORDER — POLYETHYLENE GLYCOL 3350 17 G PO PACK: 17.0000 g | PACK | Freq: Every day | ORAL | Status: DC | PRN

## 2023-04-25 MED ORDER — BUPIVACAINE IN DEXTROSE 0.75-8.25 % IT SOLN
INTRATHECAL | Status: DC | PRN
  Administered 2023-04-25: 1.6 mL via INTRATHECAL

## 2023-04-25 MED ORDER — ACETAMINOPHEN 500 MG PO TABS
ORAL_TABLET | ORAL | Status: AC
  Administered 2023-04-25: 500 mg via ORAL
  Filled 2023-04-25: qty 1

## 2023-04-25 MED ORDER — ONDANSETRON HCL 4 MG PO TABS: 4.0000 mg | ORAL_TABLET | Freq: Four times a day (QID) | ORAL | Status: DC | PRN

## 2023-04-25 MED ORDER — PANTOPRAZOLE SODIUM 40 MG PO TBEC
40.0000 mg | DELAYED_RELEASE_TABLET | Freq: Every day | ORAL | Status: DC
Start: 2023-04-26 — End: 2023-04-26
  Administered 2023-04-26: 40 mg via ORAL
  Filled 2023-04-25: qty 1

## 2023-04-25 MED ORDER — METOCLOPRAMIDE HCL 5 MG/ML IJ SOLN: 5.0000 mg | Freq: Three times a day (TID) | INTRAMUSCULAR | Status: DC | PRN

## 2023-04-25 MED ORDER — METHOCARBAMOL 500 MG PO TABS
500.0000 mg | ORAL_TABLET | Freq: Four times a day (QID) | ORAL | Status: DC | PRN
  Administered 2023-04-25 – 2023-04-26 (×3): 500 mg via ORAL
  Filled 2023-04-25 (×2): qty 1

## 2023-04-25 MED ORDER — SODIUM CHLORIDE 0.9 % IV SOLN: INTRAVENOUS | Status: DC

## 2023-04-25 MED ORDER — PHENOL 1.4 % MT LIQD: 1.0000 | OROMUCOSAL | Status: DC | PRN

## 2023-04-25 MED ORDER — POTASSIUM CHLORIDE CRYS ER 20 MEQ PO TBCR
20.0000 meq | EXTENDED_RELEASE_TABLET | Freq: Every day | ORAL | Status: DC
  Administered 2023-04-26: 20 meq via ORAL
  Filled 2023-04-25: qty 1

## 2023-04-25 MED ORDER — METOCLOPRAMIDE HCL 5 MG PO TABS
5.0000 mg | ORAL_TABLET | Freq: Three times a day (TID) | ORAL | Status: DC | PRN
Start: 2023-04-25 — End: 2023-04-26

## 2023-04-25 MED ORDER — CEFAZOLIN SODIUM-DEXTROSE 2-4 GM/100ML-% IV SOLN
INTRAVENOUS | Status: AC
  Filled 2023-04-25: qty 100

## 2023-04-25 MED ORDER — DEXAMETHASONE SODIUM PHOSPHATE 10 MG/ML IJ SOLN
8.0000 mg | Freq: Once | INTRAMUSCULAR | Status: AC
Start: 2023-04-25 — End: 2023-04-25
  Administered 2023-04-25: 8 mg via INTRAVENOUS

## 2023-04-25 MED ORDER — FUROSEMIDE 20 MG PO TABS
20.0000 mg | ORAL_TABLET | Freq: Every day | ORAL | Status: DC
  Administered 2023-04-26: 20 mg via ORAL
  Filled 2023-04-25: qty 1

## 2023-04-25 MED ORDER — CEFAZOLIN SODIUM-DEXTROSE 2-4 GM/100ML-% IV SOLN
2.0000 g | INTRAVENOUS | Status: AC
  Administered 2023-04-25: 2 g via INTRAVENOUS
  Filled 2023-04-25: qty 100

## 2023-04-25 MED ORDER — ACETAMINOPHEN 500 MG PO TABS
500.0000 mg | ORAL_TABLET | Freq: Four times a day (QID) | ORAL | Status: DC
  Filled 2023-04-25: qty 1

## 2023-04-25 MED ORDER — TRAMADOL HCL 50 MG PO TABS
50.0000 mg | ORAL_TABLET | Freq: Four times a day (QID) | ORAL | Status: DC | PRN
Start: 2023-04-25 — End: 2023-04-26
  Administered 2023-04-25 – 2023-04-26 (×2): 50 mg via ORAL
  Filled 2023-04-25: qty 1

## 2023-04-25 MED ORDER — ATORVASTATIN CALCIUM 40 MG PO TABS: 80.0000 mg | ORAL_TABLET | Freq: Every day | ORAL | Status: DC

## 2023-04-25 MED ORDER — HYDROMORPHONE HCL 1 MG/ML IJ SOLN
0.5000 mg | INTRAMUSCULAR | Status: DC | PRN
  Administered 2023-04-25: 0.5 mg via INTRAVENOUS
  Filled 2023-04-25: qty 0.5

## 2023-04-25 MED ORDER — MENTHOL 3 MG MT LOZG: 1.0000 | LOZENGE | OROMUCOSAL | Status: DC | PRN

## 2023-04-25 MED ORDER — TRAMADOL HCL 50 MG PO TABS
ORAL_TABLET | ORAL | Status: AC
  Filled 2023-04-25: qty 1

## 2023-04-25 MED ORDER — ASPIRIN 81 MG PO CHEW
81.0000 mg | CHEWABLE_TABLET | Freq: Two times a day (BID) | ORAL | Status: DC
  Administered 2023-04-26: 81 mg via ORAL
  Filled 2023-04-25: qty 1

## 2023-04-25 MED ORDER — FERROUS SULFATE 325 (65 FE) MG PO TABS
325.0000 mg | ORAL_TABLET | Freq: Three times a day (TID) | ORAL | Status: DC
  Administered 2023-04-26: 325 mg via ORAL
  Filled 2023-04-25: qty 1

## 2023-04-25 MED ORDER — POVIDONE-IODINE 10 % EX SWAB: 2.0000 | Freq: Once | CUTANEOUS | Status: DC

## 2023-04-25 MED ORDER — HYDROCODONE-ACETAMINOPHEN 5-325 MG PO TABS
1.0000 | ORAL_TABLET | ORAL | Status: DC | PRN
  Administered 2023-04-25 – 2023-04-26 (×3): 1 via ORAL
  Filled 2023-04-25 (×3): qty 1

## 2023-04-25 MED ORDER — BUPIVACAINE-EPINEPHRINE 0.25% -1:200000 IJ SOLN
INTRAMUSCULAR | Status: AC
Start: 2023-04-25 — End: ?
  Filled 2023-04-25: qty 1

## 2023-04-25 MED ORDER — DEXAMETHASONE SODIUM PHOSPHATE 10 MG/ML IJ SOLN
10.0000 mg | Freq: Once | INTRAMUSCULAR | Status: AC
  Administered 2023-04-26: 10 mg via INTRAVENOUS
  Filled 2023-04-25: qty 1

## 2023-04-25 MED ORDER — DOCUSATE SODIUM 100 MG PO CAPS
100.0000 mg | ORAL_CAPSULE | Freq: Two times a day (BID) | ORAL | Status: DC
  Administered 2023-04-25 – 2023-04-26 (×2): 100 mg via ORAL
  Filled 2023-04-25 (×2): qty 1

## 2023-04-25 MED ORDER — MONTELUKAST SODIUM 10 MG PO TABS
10.0000 mg | ORAL_TABLET | Freq: Every day | ORAL | Status: DC
  Administered 2023-04-25: 10 mg via ORAL
  Filled 2023-04-25: qty 1

## 2023-04-25 MED ORDER — METOPROLOL TARTRATE 25 MG PO TABS
25.0000 mg | ORAL_TABLET | Freq: Once | ORAL | Status: AC
  Administered 2023-04-25: 25 mg via ORAL
  Filled 2023-04-25: qty 1

## 2023-04-25 MED ORDER — ACETAMINOPHEN 10 MG/ML IV SOLN
1000.0000 mg | Freq: Four times a day (QID) | INTRAVENOUS | Status: DC
  Administered 2023-04-25: 1000 mg via INTRAVENOUS
  Filled 2023-04-25: qty 100

## 2023-04-25 MED ORDER — PHENYLEPHRINE HCL-NACL 20-0.9 MG/250ML-% IV SOLN
INTRAVENOUS | Status: DC | PRN
  Administered 2023-04-25: 15 ug/min via INTRAVENOUS

## 2023-04-25 MED ORDER — ORAL CARE MOUTH RINSE: 15.0000 mL | Freq: Once | OROMUCOSAL | Status: AC

## 2023-04-25 MED ORDER — CEFAZOLIN SODIUM-DEXTROSE 2-4 GM/100ML-% IV SOLN
2.0000 g | Freq: Four times a day (QID) | INTRAVENOUS | Status: AC
  Administered 2023-04-25 (×2): 2 g via INTRAVENOUS
  Filled 2023-04-25: qty 100

## 2023-04-25 MED ORDER — NITROGLYCERIN 0.4 MG SL SUBL: 0.4000 mg | SUBLINGUAL_TABLET | SUBLINGUAL | Status: DC | PRN

## 2023-04-25 MED ORDER — FENTANYL CITRATE PF 50 MCG/ML IJ SOSY: 25.0000 ug | PREFILLED_SYRINGE | INTRAMUSCULAR | Status: DC | PRN

## 2023-04-25 MED ORDER — BUPIVACAINE-EPINEPHRINE (PF) 0.25% -1:200000 IJ SOLN
INTRAMUSCULAR | Status: DC | PRN
  Administered 2023-04-25: 30 mL

## 2023-04-25 MED ORDER — WATER FOR IRRIGATION, STERILE IR SOLN
Status: DC | PRN
  Administered 2023-04-25: 1000 mL

## 2023-04-25 MED ORDER — PROPOFOL 10 MG/ML IV BOLUS
INTRAVENOUS | Status: DC | PRN
  Administered 2023-04-25: 30 mg via INTRAVENOUS
  Administered 2023-04-25: 20 mg via INTRAVENOUS

## 2023-04-25 MED ORDER — HYDROCODONE-ACETAMINOPHEN 5-325 MG PO TABS
ORAL_TABLET | ORAL | Status: AC
  Filled 2023-04-25: qty 1

## 2023-04-25 MED ORDER — CHLORHEXIDINE GLUCONATE 0.12 % MT SOLN
15.0000 mL | Freq: Once | OROMUCOSAL | Status: AC
  Administered 2023-04-25: 15 mL via OROMUCOSAL

## 2023-04-25 MED ORDER — TRANEXAMIC ACID-NACL 1000-0.7 MG/100ML-% IV SOLN
1000.0000 mg | INTRAVENOUS | Status: AC
  Administered 2023-04-25: 1000 mg via INTRAVENOUS
  Filled 2023-04-25: qty 100

## 2023-04-25 MED ORDER — ONDANSETRON HCL 4 MG/2ML IJ SOLN
INTRAMUSCULAR | Status: DC | PRN
  Administered 2023-04-25: 4 mg via INTRAVENOUS

## 2023-04-25 MED ORDER — LACTATED RINGERS IV SOLN: INTRAVENOUS | Status: DC

## 2023-04-25 MED ORDER — METHOCARBAMOL 1000 MG/10ML IJ SOLN: 500.0000 mg | Freq: Four times a day (QID) | INTRAMUSCULAR | Status: DC | PRN

## 2023-04-25 MED ORDER — 0.9 % SODIUM CHLORIDE (POUR BTL) OPTIME
TOPICAL | Status: DC | PRN
  Administered 2023-04-25: 1000 mL

## 2023-04-25 MED ORDER — PROPOFOL 500 MG/50ML IV EMUL
INTRAVENOUS | Status: DC | PRN
  Administered 2023-04-25: 50 ug/kg/min via INTRAVENOUS

## 2023-04-25 MED ORDER — ONDANSETRON HCL 4 MG/2ML IJ SOLN: 4.0000 mg | Freq: Four times a day (QID) | INTRAMUSCULAR | Status: DC | PRN

## 2023-04-25 MED ORDER — MAGNESIUM CITRATE PO SOLN: 1.0000 | Freq: Once | ORAL | Status: DC | PRN

## 2023-04-25 MED ORDER — BISACODYL 10 MG RE SUPP: 10.0000 mg | Freq: Every day | RECTAL | Status: DC | PRN

## 2023-04-25 MED ORDER — ACETAMINOPHEN 325 MG PO TABS
325.0000 mg | ORAL_TABLET | Freq: Four times a day (QID) | ORAL | Status: DC | PRN
Start: 2023-04-26 — End: 2023-04-26

## 2023-04-25 MED ORDER — METHOCARBAMOL 500 MG PO TABS
ORAL_TABLET | ORAL | Status: AC
  Filled 2023-04-25: qty 1

## 2023-04-25 MED ORDER — METOPROLOL TARTRATE 25 MG PO TABS
25.0000 mg | ORAL_TABLET | Freq: Every day | ORAL | Status: DC
Start: 2023-04-26 — End: 2023-04-26
  Administered 2023-04-26: 25 mg via ORAL
  Filled 2023-04-25: qty 1

## 2023-04-25 SURGICAL SUPPLY — 38 items
BAG COUNTER SPONGE SURGICOUNT (BAG) IMPLANT
BAG ZIPLOCK 12X15 (MISCELLANEOUS) IMPLANT
BLADE SAG 18X100X1.27 (BLADE) ×1 IMPLANT
COVER PERINEAL POST (MISCELLANEOUS) ×1 IMPLANT
COVER SURGICAL LIGHT HANDLE (MISCELLANEOUS) ×1 IMPLANT
CUP ACETBLR 48 OD SECTOR II (Hips) IMPLANT
DERMABOND ADVANCED .7 DNX12 (GAUZE/BANDAGES/DRESSINGS) ×1 IMPLANT
DRAPE FOOT SWITCH (DRAPES) ×1 IMPLANT
DRAPE STERI IOBAN 125X83 (DRAPES) ×1 IMPLANT
DRAPE U-SHAPE 47X51 STRL (DRAPES) ×2 IMPLANT
DRSG AQUACEL AG ADV 3.5X10 (GAUZE/BANDAGES/DRESSINGS) ×1 IMPLANT
DRSG IV TEGADERM 3.5X4.5 STRL (GAUZE/BANDAGES/DRESSINGS) IMPLANT
DURAPREP 26ML APPLICATOR (WOUND CARE) ×1 IMPLANT
ELECT REM PT RETURN 15FT ADLT (MISCELLANEOUS) ×1 IMPLANT
GAUZE SPONGE 4X4 12PLY STRL (GAUZE/BANDAGES/DRESSINGS) IMPLANT
GLOVE BIO SURGEON STRL SZ 6.5 (GLOVE) IMPLANT
GLOVE BIO SURGEON STRL SZ7 (GLOVE) IMPLANT
GLOVE BIO SURGEON STRL SZ8 (GLOVE) ×1 IMPLANT
GLOVE BIOGEL PI IND STRL 7.0 (GLOVE) IMPLANT
GLOVE BIOGEL PI IND STRL 8 (GLOVE) ×1 IMPLANT
GOWN STRL REUS W/ TWL LRG LVL3 (GOWN DISPOSABLE) ×2 IMPLANT
HEAD FEM STD 28X+1.5 STRL (Hips) IMPLANT
HOLDER FOLEY CATH W/STRAP (MISCELLANEOUS) ×1 IMPLANT
KIT TURNOVER KIT A (KITS) IMPLANT
LINER MARATHON 28 48 (Hips) IMPLANT
MANIFOLD NEPTUNE II (INSTRUMENTS) ×1 IMPLANT
PACK ANTERIOR HIP CUSTOM (KITS) ×1 IMPLANT
PENCIL SMOKE EVACUATOR COATED (MISCELLANEOUS) ×1 IMPLANT
SPIKE FLUID TRANSFER (MISCELLANEOUS) ×1 IMPLANT
STEM FEM ACTIS STD SZ7 (Nail) IMPLANT
SUT ETHIBOND NAB CT1 #1 30IN (SUTURE) ×1 IMPLANT
SUT MNCRL AB 4-0 PS2 18 (SUTURE) ×1 IMPLANT
SUT STRATAFIX 0 PDS 27 VIOLET (SUTURE) ×1 IMPLANT
SUT VIC AB 2-0 CT1 TAPERPNT 27 (SUTURE) ×2 IMPLANT
SUTURE STRATFX 0 PDS 27 VIOLET (SUTURE) ×1 IMPLANT
TOWEL GREEN STERILE FF (TOWEL DISPOSABLE) ×1 IMPLANT
TRAY FOLEY MTR SLVR 16FR STAT (SET/KITS/TRAYS/PACK) ×1 IMPLANT
TUBE SUCTION HIGH CAP CLEAR NV (SUCTIONS) ×1 IMPLANT

## 2023-04-25 NOTE — Transfer of Care (Signed)
Immediate Anesthesia Transfer of Care Note  Patient: Diana Patterson  Procedure(s) Performed: TOTAL HIP ARTHROPLASTY ANTERIOR APPROACH (Left: Hip)  Patient Location: PACU  Anesthesia Type:Spinal and MAC combined with regional for post-op pain  Level of Consciousness: awake, alert , oriented, and patient cooperative  Airway & Oxygen Therapy: Patient Spontanous Breathing and Patient connected to face mask oxygen  Post-op Assessment: Report given to RN and Post -op Vital signs reviewed and stable  Post vital signs: Reviewed and stable  Last Vitals:  Vitals Value Taken Time  BP 123/52 04/25/23 1421  Temp    Pulse 59 04/25/23 1426  Resp 17 04/25/23 1426  SpO2 100 % 04/25/23 1426  Vitals shown include unfiled device data.  Last Pain:  Vitals:   04/25/23 1018  TempSrc:   PainSc: 8          Complications: No notable events documented.

## 2023-04-25 NOTE — Interval H&P Note (Signed)
History and Physical Interval Note:  04/25/2023 10:23 AM  Diana Patterson  has presented today for surgery, with the diagnosis of Left Hip Osteoarthritis.  The various methods of treatment have been discussed with the patient and family. After consideration of risks, benefits and other options for treatment, the patient has consented to  Procedure(s): TOTAL HIP ARTHROPLASTY ANTERIOR APPROACH (Left) as a surgical intervention.  The patient's history has been reviewed, patient examined, no change in status, stable for surgery.  I have reviewed the patient's chart and labs.  Questions were answered to the patient's satisfaction.     Homero Fellers Loxley Schmale

## 2023-04-25 NOTE — Discharge Instructions (Signed)
Frank Aluisio, MD Total Joint Specialist [facility] [address] [place], [place] [address] [phone]  ANTERIOR APPROACH TOTAL HIP REPLACEMENT POSTOPERATIVE DIRECTIONS     Hip Rehabilitation, Guidelines Following Surgery  The results of a hip operation are greatly improved after range of motion and muscle strengthening exercises. Follow all safety measures which are given to protect your hip. If any of these exercises cause increased pain or swelling in your joint, decrease the amount until you are comfortable again. Then slowly increase the exercises. Call your caregiver if you have problems or questions.   BLOOD CLOT PREVENTION Take an 81 mg Aspirin two times a day for three weeks following surgery. Then take an 81 mg Aspirin once a day for three weeks. Then discontinue Aspirin. You may resume your vitamins/supplements upon discharge from the hospital. Do not take any NSAIDs (Advil, Aleve, Ibuprofen, Meloxicam, etc.) until you are 3 weeks out from surgery  HOME CARE INSTRUCTIONS  Remove items at home which could result in a fall. This includes throw rugs or furniture in walking pathways.  ICE to the affected hip as frequently as 20-30 minutes an hour and then as needed for pain and swelling. Continue to use ice on the hip for pain and swelling from surgery. You may notice swelling that will progress down to the foot and ankle. This is normal after surgery. Elevate the leg when you are not up walking on it.   Continue to use the breathing machine which will help keep your temperature down.  It is common for your temperature to cycle up and down following surgery, especially at night when you are not up moving around and exerting yourself.  The breathing machine keeps your lungs expanded and your temperature down.  DIET You may resume your previous home diet once your are discharged from the hospital.  DRESSING / WOUND CARE / SHOWERING You have an  adhesive waterproof bandage over the incision. Leave this in place until your first follow-up appointment. Once you remove this you will not need to place another bandage.  You may begin showering 3 days following surgery, but do not submerge the incision under water.  ACTIVITY For the first 3-5 days, it is important to rest and keep the operative leg elevated. You should, as a general rule, rest for 50 minutes and walk/stretch for 10 minutes per hour. After 5 days, you may slowly increase activity as tolerated.  Perform the exercises you were provided twice a day for about 15-20 minutes each session. Begin these 2 days following surgery. Walk with your walker as instructed. Use the walker until you are comfortable transitioning to a cane. Walk with the cane in the opposite hand of the operative leg. You may discontinue the cane once you are comfortable and walking steadily. Avoid periods of inactivity such as sitting longer than an hour when not asleep. This helps prevent blood clots.  Do not drive a car for 6 weeks or until released by your surgeon.  Do not drive while taking narcotics.  TED HOSE STOCKINGS Wear the elastic stockings on both legs for three weeks following surgery during the day. You may remove them at night while sleeping.  WEIGHT BEARING Weight bearing as tolerated with assist device (walker, cane, etc) as directed, use it as long as suggested by your surgeon or therapist, typically at least 4-6 weeks.  POSTOPERATIVE CONSTIPATION PROTOCOL Constipation - defined medically as fewer than three stools per week and severe constipation as   less than one stool per week.  One of the most common issues patients have following surgery is constipation.  Even if you have a regular bowel pattern at home, your normal regimen is likely to be disrupted due to multiple reasons following surgery.  Combination of anesthesia, postoperative narcotics, change in appetite and fluid intake all can  affect your bowels.  In order to avoid complications following surgery, here are some recommendations in order to help you during your recovery period.  Colace (docusate) - Pick up an over-the-counter form of Colace or another stool softener and take twice a day as long as you are requiring postoperative pain medications.  Take with a full glass of water daily.  If you experience loose stools or diarrhea, hold the colace until you stool forms back up.  If your symptoms do not get better within 1 week or if they get worse, check with your doctor. Dulcolax (bisacodyl) - Pick up over-the-counter and take as directed by the product packaging as needed to assist with the movement of your bowels.  Take with a full glass of water.  Use this product as needed if not relieved by Colace only.  MiraLax (polyethylene glycol) - Pick up over-the-counter to have on hand.  MiraLax is a solution that will increase the amount of water in your bowels to assist with bowel movements.  Take as directed and can mix with a glass of water, juice, soda, coffee, or tea.  Take if you go more than two days without a movement.Do not use MiraLax more than once per day. Call your doctor if you are still constipated or irregular after using this medication for 7 days in a row.  If you continue to have problems with postoperative constipation, please contact the office for further assistance and recommendations.  If you experience "the worst abdominal pain ever" or develop nausea or vomiting, please contact the office immediatly for further recommendations for treatment.  ITCHING  If you experience itching with your medications, try taking only a single pain pill, or even half a pain pill at a time.  You can also use Benadryl over the counter for itching or also to help with sleep.   MEDICATIONS See your medication summary on the "After Visit Summary" that the nursing staff will review with you prior to discharge.  You may have some home  medications which will be placed on hold until you complete the course of blood thinner medication.  It is important for you to complete the blood thinner medication as prescribed by your surgeon.  Continue your approved medications as instructed at time of discharge.  PRECAUTIONS If you experience chest pain or shortness of breath - call 911 immediately for transfer to the hospital emergency department.  If you develop a fever greater that 101 F, purulent drainage from wound, increased redness or drainage from wound, foul odor from the wound/dressing, or calf pain - CONTACT YOUR SURGEON.                                                   FOLLOW-UP APPOINTMENTS Make sure you keep all of your appointments after your operation with your surgeon and caregivers. You should call the office at the above phone number and make an appointment for approximately two weeks after the date of your surgery or on the   date instructed by your surgeon outlined in the "After Visit Summary".  RANGE OF MOTION AND STRENGTHENING EXERCISES  These exercises are designed to help you keep full movement of your hip joint. Follow your caregiver's or physical therapist's instructions. Perform all exercises about fifteen times, three times per day or as directed. Exercise both hips, even if you have had only one joint replacement. These exercises can be done on a training (exercise) mat, on the floor, on a table or on a bed. Use whatever works the best and is most comfortable for you. Use music or television while you are exercising so that the exercises are a pleasant break in your day. This will make your life better with the exercises acting as a break in routine you can look forward to.  Lying on your back, slowly slide your foot toward your buttocks, raising your knee up off the floor. Then slowly slide your foot back down until your leg is straight again.  Lying on your back spread your legs as far apart as you can without causing  discomfort.  Lying on your side, raise your upper leg and foot straight up from the floor as far as is comfortable. Slowly lower the leg and repeat.  Lying on your back, tighten up the muscle in the front of your thigh (quadriceps muscles). You can do this by keeping your leg straight and trying to raise your heel off the floor. This helps strengthen the largest muscle supporting your knee.  Lying on your back, tighten up the muscles of your buttocks both with the legs straight and with the knee bent at a comfortable angle while keeping your heel on the floor.   POST-OPERATIVE OPIOID TAPER INSTRUCTIONS: It is important to wean off of your opioid medication as soon as possible. If you do not need pain medication after your surgery it is ok to stop day one. Opioids include: Codeine, Hydrocodone(Norco, Vicodin), Oxycodone(Percocet, oxycontin) and hydromorphone amongst others.  Long term and even short term use of opiods can cause: Increased pain response Dependence Constipation Depression Respiratory depression And more.  Withdrawal symptoms can include Flu like symptoms Nausea, vomiting And more Techniques to manage these symptoms Hydrate well Eat regular healthy meals Stay active Use relaxation techniques(deep breathing, meditating, yoga) Do Not substitute Alcohol to help with tapering If you have been on opioids for less than two weeks and do not have pain than it is ok to stop all together.  Plan to wean off of opioids This plan should start within one week post op of your joint replacement. Maintain the same interval or time between taking each dose and first decrease the dose.  Cut the total daily intake of opioids by one tablet each day Next start to increase the time between doses. The last dose that should be eliminated is the evening dose.   IF YOU ARE TRANSFERRED TO A SKILLED REHAB FACILITY If the patient is transferred to a skilled rehab facility following release from the  hospital, a list of the current medications will be sent to the facility for the patient to continue.  When discharged from the skilled rehab facility, please have the facility set up the patient's Home Health Physical Therapy prior to being released. Also, the skilled facility will be responsible for providing the patient with their medications at time of release from the facility to include their pain medication, the muscle relaxants, and their blood thinner medication. If the patient is still at the rehab facility   at time of the two week follow up appointment, the skilled rehab facility will also need to assist the patient in arranging follow up appointment in our office and any transportation needs.  MAKE SURE YOU:  Understand these instructions.  Get help right away if you are not doing well or get worse.    DENTAL ANTIBIOTICS:  In most cases prophylactic antibiotics for Dental procdeures after total joint surgery are not necessary.  Exceptions are as follows:  1. History of prior total joint infection  2. Severely immunocompromised (Organ Transplant, cancer chemotherapy, Rheumatoid biologic meds such as Humera)  3. Poorly controlled diabetes (A1C &gt; 8.0, blood glucose over 200)  If you have one of these conditions, contact your surgeon for an antibiotic prescription, prior to your dental procedure.    Pick up stool softner and laxative for home use following surgery while on pain medications. Do not submerge incision under water. Please use good hand washing techniques while changing dressing each day. May shower starting three days after surgery. Please use a clean towel to pat the incision dry following showers. Continue to use ice for pain and swelling after surgery. Do not use any lotions or creams on the incision until instructed by your surgeon.  

## 2023-04-25 NOTE — Plan of Care (Signed)
  Problem: Pain Managment: Goal: General experience of comfort will improve and/or be controlled Outcome: Progressing   Problem: Safety: Goal: Ability to remain free from injury will improve Outcome: Progressing

## 2023-04-25 NOTE — Care Plan (Signed)
Ortho Bundle Case Management Note  Patient Details  Name: Eustolia Drennen MRN: [id number] Date of Birth: 01-Jan-1946                  L [facility] on 04/25/23.  DCP: Home with family.  DME: No needs. Has RW.  PT: [facility]   DME Arranged:  N/A DME Agency:       Additional Comments: Please contact me with any questions of if this plan should need to change.    Despina Pole, CCM Case Manager, [facility]  [phone] 04/25/2023, 12:13 PM

## 2023-04-25 NOTE — Op Note (Signed)
OPERATIVE REPORT- TOTAL HIP ARTHROPLASTY   PREOPERATIVE DIAGNOSIS: Osteoarthritis of the Left hip.   POSTOPERATIVE DIAGNOSIS: Osteoarthritis of the Left  hip.   PROCEDURE: Left total hip arthroplasty, anterior approach.   SURGEON: Ollen Gross, MD   ASSISTANT: Arther Abbott, PA-C  ANESTHESIA:  Spinal  ESTIMATED BLOOD LOSS:-500 mL    DRAINS: None  COMPLICATIONS: None   CONDITION: PACU - hemodynamically stable.   BRIEF CLINICAL NOTE: Diana Patterson is a 78 y.o. female who has advanced end-  stage arthritis of their Left  hip with progressively worsening pain and  dysfunction.The patient has failed nonoperative management and presents for  total hip arthroplasty.   PROCEDURE IN DETAIL: After successful administration of spinal  anesthetic, the traction boots for the [facility] bed were placed on both  feet and the patient was placed onto the [facility] bed, boots placed into the leg  holders. The Left hip was then isolated from the perineum with plastic  drapes and prepped and draped in the usual sterile fashion. ASIS and  greater trochanter were marked and a oblique incision was made, starting  at about 1 cm lateral and 2 cm distal to the ASIS and coursing towards  the anterior cortex of the femur. The skin was cut with a 10 blade  through subcutaneous tissue to the level of the fascia overlying the  tensor fascia lata muscle. The fascia was then incised in line with the  incision at the junction of the anterior third and posterior 2/3rd. The  muscle was teased off the fascia and then the interval between the TFL  and the rectus was developed. The Hohmann retractor was then placed at  the top of the femoral neck over the capsule. The vessels overlying the  capsule were cauterized and the fat on top of the capsule was removed.  A Hohmann retractor was then placed anterior underneath the rectus  femoris to give exposure to the entire anterior capsule. A T-shaped   capsulotomy was performed. The edges were tagged and the femoral head  was identified.       Osteophytes are removed off the superior acetabulum.  The femoral neck was then cut in situ with an oscillating saw. Traction  was then applied to the left lower extremity utilizing the [facility]  traction. The femoral head was then removed. Retractors were placed  around the acetabulum and then circumferential removal of the labrum was  performed. Osteophytes were also removed. Reaming starts at 45 mm to  medialize and  Increased in 2 mm increments to 47 mm. We reamed in  approximately 40 degrees of abduction, 20 degrees anteversion. A 48 mm  pinnacle acetabular shell was then impacted in anatomic position under  fluoroscopic guidance with excellent purchase. We did not need to place  any additional dome screws. A 28 mm neutral + 4 marathon liner was then  placed into the acetabular shell.       The femoral lift was then placed along the lateral aspect of the femur  just distal to the vastus ridge. The leg was  externally rotated and capsule  was stripped off the inferior aspect of the femoral neck down to the  level of the lesser trochanter, this was done with electrocautery. The femur was lifted after this was performed. The  leg was then placed in an extended and adducted position essentially delivering the femur. We also removed the capsule superiorly and the piriformis from the piriformis fossa to  gain excellent exposure of the  proximal femur. Rongeur was used to remove some cancellous bone to get  into the lateral portion of the proximal femur for placement of the  initial starter reamer. The starter broaches was placed  the starter broach  and was shown to go down the center of the canal. Broaching  with the Actis system was then performed starting at size 0  coursing  Up to size 7. A size 7 had excellent torsional and rotational  and axial stability. The trial standard offset neck was then  placed  with a 28 + 1.5 trial head. The hip was then reduced. We confirmed that  the stem was in the canal both on AP and lateral x-rays. It also has excellent sizing. The hip was reduced with outstanding stability through full extension and full external rotation.. AP pelvis was taken and the leg lengths were measured and found to be equal. Hip was then dislocated again and the femoral head and neck removed. The  femoral broach was removed. Size 7 Actis stem with a standard offset  neck was then impacted into the femur following native anteversion. Has  excellent purchase in the canal. Excellent torsional and rotational and  axial stability. It is confirmed to be in the canal on AP and lateral  fluoroscopic views. The 28 + 1.5 metal head was placed and the hip  reduced with outstanding stability. Again AP pelvis was taken and it  confirmed that the leg lengths were equal. The wound was then copiously  irrigated with saline solution and the capsule reattached and repaired  with Ethibond suture. 30 ml of .25% Bupivicaine was  injected into the capsule and into the edge of the tensor fascia lata as well as subcutaneous tissue. The fascia overlying the tensor fascia lata was then closed with a running #1 V-Loc. Subcu was closed with interrupted 2-0 Vicryl and subcuticular running 4-0 Monocryl. Incision was cleaned  and dried. Steri-Strips and a bulky sterile dressing applied. The patient was awakened and transported to  recovery in stable condition.        Please note that a surgical assistant was a medical necessity for this procedure to perform it in a safe and expeditious manner. Assistant was necessary to provide appropriate retraction of vital neurovascular structures and to prevent femoral fracture and allow for anatomic placement of the prosthesis.  Ollen Gross, M.D.

## 2023-04-25 NOTE — Anesthesia Procedure Notes (Signed)
Spinal  Patient location during procedure: OR Start time: 04/25/2023 12:42 PM End time: 04/25/2023 12:48 PM Reason for block: surgical anesthesia Staffing Performed: resident/CRNA  Anesthesiologist: Marcene Duos, MD Resident/CRNA: Ponciano Ort, CRNA Performed by: Ponciano Ort, CRNA Authorized by: Gaynelle Adu, MD   Preanesthetic Checklist Completed: patient identified, IV checked, site marked, risks and benefits discussed, surgical consent, monitors and equipment checked, pre-op evaluation and timeout performed Spinal Block Patient position: sitting Prep: DuraPrep Patient monitoring: heart rate, cardiac monitor, continuous pulse ox and blood pressure Approach: midline Location: L3-4 Injection technique: single-shot Needle Needle type: Pencan  Needle gauge: 24 G Needle length: 10 cm Assessment Sensory level: T4 Events: CSF return Additional Notes Pt tolerated well- full monitor, VSS, O2 via FM; maintained sterile technique throughout; skin anesthetized with 1% lidocaine; clear CSF seen before and after spinal bupivacaine placed.

## 2023-04-25 NOTE — Anesthesia Preprocedure Evaluation (Addendum)
Anesthesia Evaluation  Patient identified by MRN, date of birth, ID band Patient awake    Reviewed: Allergy & Precautions, H&P , NPO status , Patient's Chart, lab work & pertinent test results, reviewed documented beta blocker date and time   Airway Mallampati: II  TM Distance: >3 FB Neck ROM: Full    Dental no notable dental hx. (+) Edentulous Upper, Edentulous Lower, Dental Advisory Given   Pulmonary former smoker   Pulmonary exam normal breath sounds clear to auscultation       Cardiovascular hypertension, Pt. on medications and Pt. on home beta blockers + CAD and + Cardiac Stents   Rhythm:Regular Rate:Normal     Neuro/Psych TIA negative psych ROS   GI/Hepatic negative GI ROS, Neg liver ROS,,,  Endo/Other  negative endocrine ROS    Renal/GU Renal InsufficiencyRenal disease  negative genitourinary   Musculoskeletal  (+) Arthritis , Osteoarthritis,    Abdominal   Peds  Hematology  (+) Blood dyscrasia, anemia   Anesthesia Other Findings   Reproductive/Obstetrics negative OB ROS                             Anesthesia Physical Anesthesia Plan  ASA: 3  Anesthesia Plan: Spinal   Post-op Pain Management: Ofirmev IV (intra-op)*   Induction: Intravenous  PONV Risk Score and Plan: 3 and Ondansetron, Dexamethasone and Propofol infusion  Airway Management Planned: Natural Airway and Simple Face Mask  Additional Equipment:   Intra-op Plan:   Post-operative Plan:   Informed Consent: I have reviewed the patients History and Physical, chart, labs and discussed the procedure including the risks, benefits and alternatives for the proposed anesthesia with the patient or authorized representative who has indicated his/her understanding and acceptance.     Dental advisory given  Plan Discussed with: CRNA  Anesthesia Plan Comments:        Anesthesia Quick Evaluation

## 2023-04-25 NOTE — Anesthesia Postprocedure Evaluation (Signed)
Anesthesia Post Note  Patient: Diana Patterson  Procedure(s) Performed: TOTAL HIP ARTHROPLASTY ANTERIOR APPROACH (Left: Hip)     Patient location during evaluation: PACU Anesthesia Type: Spinal Level of consciousness: oriented and awake and alert Pain management: pain level controlled Vital Signs Assessment: post-procedure vital signs reviewed and stable Respiratory status: spontaneous breathing and respiratory function stable Cardiovascular status: blood pressure returned to baseline and stable Postop Assessment: no headache, no backache, no apparent nausea or vomiting, spinal receding and patient able to bend at knees Anesthetic complications: no  No notable events documented.  Last Vitals:  Vitals:   04/25/23 1745 04/25/23 1800  BP: (!) 159/68 (!) 165/97  Pulse: 65 62  Resp: 18 19  Temp:    SpO2: 96% 97%    Last Pain:  Vitals:   04/25/23 1800  TempSrc:   PainSc: 0-No pain                 Peder Allums,W. EDMOND

## 2023-04-26 ENCOUNTER — Encounter (HOSPITAL_COMMUNITY): Payer: Self-pay | Admitting: Orthopedic Surgery

## 2023-04-26 ENCOUNTER — Other Ambulatory Visit (HOSPITAL_COMMUNITY): Payer: Self-pay

## 2023-04-26 DIAGNOSIS — Z8673 Personal history of transient ischemic attack (TIA), and cerebral infarction without residual deficits: Secondary | ICD-10-CM | POA: Diagnosis not present

## 2023-04-26 DIAGNOSIS — I2510 Atherosclerotic heart disease of native coronary artery without angina pectoris: Secondary | ICD-10-CM | POA: Diagnosis not present

## 2023-04-26 DIAGNOSIS — M1612 Unilateral primary osteoarthritis, left hip: Secondary | ICD-10-CM | POA: Diagnosis not present

## 2023-04-26 DIAGNOSIS — E1122 Type 2 diabetes mellitus with diabetic chronic kidney disease: Secondary | ICD-10-CM | POA: Diagnosis not present

## 2023-04-26 DIAGNOSIS — N183 Chronic kidney disease, stage 3 unspecified: Secondary | ICD-10-CM | POA: Diagnosis not present

## 2023-04-26 DIAGNOSIS — I129 Hypertensive chronic kidney disease with stage 1 through stage 4 chronic kidney disease, or unspecified chronic kidney disease: Secondary | ICD-10-CM | POA: Diagnosis not present

## 2023-04-26 LAB — BASIC METABOLIC PANEL
Anion gap: 8 (ref 5–15)
BUN: 16 mg/dL (ref 8–23)
CO2: 22 mmol/L (ref 22–32)
Calcium: 8.6 mg/dL — ABNORMAL LOW (ref 8.9–10.3)
Chloride: 108 mmol/L (ref 98–111)
Creatinine, Ser: 0.94 mg/dL (ref 0.44–1.00)
GFR, Estimated: >60 mL/min (ref >60)
Glucose, Bld: 143 mg/dL — ABNORMAL HIGH (ref 70–99)
Potassium: 4.2 mmol/L (ref 3.5–5.1)
Sodium: 138 mmol/L (ref 135–145)

## 2023-04-26 LAB — CBC
HCT: 26 % — ABNORMAL LOW (ref 36.0–46.0)
Hemoglobin: 8.3 g/dL — ABNORMAL LOW (ref 12.0–15.0)
MCH: 28.8 pg (ref 26.0–34.0)
MCHC: 31.9 g/dL (ref 30.0–36.0)
MCV: 90.3 fL (ref 80.0–100.0)
Platelets: 119 10*3/uL — ABNORMAL LOW (ref 150–400)
RBC: 2.88 MIL/uL — ABNORMAL LOW (ref 3.87–5.11)
RDW: 19.5 % — ABNORMAL HIGH (ref 11.5–15.5)
WBC: 9.3 10*3/uL (ref 4.0–10.5)
nRBC: 0 % (ref 0.0–0.2)

## 2023-04-26 MED ORDER — ASPIRIN 81 MG PO CHEW
81.0000 mg | CHEWABLE_TABLET | Freq: Two times a day (BID) | ORAL | 0 refills | Status: AC
  Filled 2023-04-26: qty 40, 20d supply, fill #0

## 2023-04-26 MED ORDER — HYDROCODONE-ACETAMINOPHEN 5-325 MG PO TABS
1.0000 | ORAL_TABLET | Freq: Four times a day (QID) | ORAL | 0 refills | Status: DC | PRN
  Filled 2023-04-26: qty 42, 11d supply, fill #0

## 2023-04-26 MED ORDER — TRAMADOL HCL 50 MG PO TABS
50.0000 mg | ORAL_TABLET | Freq: Four times a day (QID) | ORAL | 0 refills | Status: AC | PRN
  Filled 2023-04-26: qty 40, 5d supply, fill #0

## 2023-04-26 MED ORDER — METHOCARBAMOL 500 MG PO TABS
500.0000 mg | ORAL_TABLET | Freq: Four times a day (QID) | ORAL | 0 refills | Status: DC | PRN
  Filled 2023-04-26: qty 40, 10d supply, fill #0

## 2023-04-26 MED ORDER — ONDANSETRON HCL 4 MG PO TABS
4.0000 mg | ORAL_TABLET | Freq: Four times a day (QID) | ORAL | 0 refills | Status: DC | PRN
  Filled 2023-04-26: qty 20, 5d supply, fill #0

## 2023-04-26 NOTE — Progress Notes (Signed)
   Subjective: 1 Day Post-Op Procedure(s) (LRB): TOTAL HIP ARTHROPLASTY ANTERIOR APPROACH (Left) Patient seen in rounds by Dr. Melodi. Patient is well, and has had no acute complaints or problems. Denies SOB or chest pain. Denies calf pain. Foley cath removed this AM. Patient reports pain as moderate. We will start physical therapy today.   Objective: Vital signs in last 24 hours: Temp:  [97.5 F (36.4 C)-98.8 F (37.1 C)] 98.5 F (36.9 C) (02/04 9367) Pulse Rate:  [52-77] 77 (02/04 0632) Resp:  [12-20] 17 (02/04 0632) BP: (123-185)/(52-97) 145/61 (02/04 0632) SpO2:  [92 %-100 %] 96 % (02/04 9367) Weight:  [88 kg] 88 kg (02/03 1018)  Intake/Output from previous day:  Intake/Output Summary (Last 24 hours) at 04/26/2023 0810 Last data filed at 04/26/2023 0600 Gross per 24 hour  Intake 2635.26 ml  Output 1900 ml  Net 735.26 ml     Intake/Output this shift: No intake/output data recorded.  Labs: Recent Labs    04/26/23 0332  HGB 8.3*   Recent Labs    04/26/23 0332  WBC 9.3  RBC 2.88*  HCT 26.0*  PLT 119*   Recent Labs    04/26/23 0332  NA 138  K 4.2  CL 108  CO2 22  BUN 16  CREATININE 0.94  GLUCOSE 143*  CALCIUM  8.6*   No results for input(s): LABPT, INR in the last 72 hours.  Exam: General - Patient is Alert and Oriented Extremity - Neurologically intact Neurovascular intact Sensation intact distally Dorsiflexion/Plantar flexion intact Dressing - dressing C/D/I Motor Function - intact, moving foot and toes well on exam.  Past Medical History:  Diagnosis Date   Arthritis    CAD (coronary artery disease)    DES to circumflex June 2017, moderate residual LAD disease   CKD (chronic kidney disease) stage 3, GFR 30-59 ml/min (HCC)    Essential hypertension    Mixed hyperlipidemia    Muscle cramps at night    NSTEMI (non-ST elevated myocardial infarction) (HCC) 2016   Probable type II event, negative Cardiolite 2012   Sleep apnea    no onger  uses cpap   TIA (transient ischemic attack)     Assessment/Plan: 1 Day Post-Op Procedure(s) (LRB): TOTAL HIP ARTHROPLASTY ANTERIOR APPROACH (Left) Principal Problem:   OA (osteoarthritis) of hip Active Problems:   Primary osteoarthritis of left hip  Estimated body mass index is 32.28 kg/m as calculated from the following:   Height as of this encounter: 5' 5 (1.651 m).   Weight as of this encounter: 88 kg. Advance diet Up with therapy D/C IV fluids  DVT Prophylaxis - Aspirin  Weight bearing as tolerated.  Patient with chronic anemia. Hgb 10.4 on admission, now 8.3 post-op. Asymptomatic. Will continue to monitor while inpatient with low threshold to transfuse if needed.  Start physical therapy. Possible discharge home today pending progress with physical therapy and if meeting patient goals. Will do HEP once discharged. Low threshold to keep additional night to maximize mobility.  The PDMP database was reviewed today prior to any opioid medications being prescribed to this patient.  R. Zelda Kobs, PA-C Orthopedic Surgery 04/26/2023, 8:10 AM

## 2023-04-26 NOTE — Progress Notes (Signed)
AVS reviewed w/ pt who verbalized an understanding. No other questions at this time. PIV removed as noted. Pt eating lunch & then will d/c home w/ daughter. Pt requested muscle relaxer - primary nurse updated via secure chat

## 2023-04-26 NOTE — TOC Transition Note (Signed)
 Transition of Care [facility]) - Discharge Note   Patient Details  Name: Diana Patterson MRN: [id number] Date of Birth: [birth year]  [facility] CM/SW Contact:  NORMAN ASPEN, LCSW Phone Number: 04/26/2023, 11:15 AM   Clinical Narrative:     Met with pt who confirms she has needed DME in the home.  Plan for HEP.  No TOC needs.  Final next level of care: Home/Self Care Barriers to Discharge: No Barriers Identified   Patient Goals and CMS Choice Patient states their goals for this hospitalization and ongoing recovery are:: return home          Discharge Placement                       Discharge Plan and Services Additional resources added to the After Visit Summary for                  DME Arranged: N/A DME Agency: NA                  Social Drivers of Health (SDOH) Interventions SDOH Screenings   Food Insecurity: No Food Insecurity (04/25/2023)  Housing: Low Risk  (04/25/2023)  Transportation Needs: No Transportation Needs (04/25/2023)  Utilities: Not At Risk (04/25/2023)  Financial Resource Strain: Low Risk  (04/15/2021)   Received from [facility], [facility]  Physical Activity: Inactive (04/15/2021)   [facility] from [facility], [facility]  Social Connections: Moderately Isolated (04/15/2021)   Received from [facility], [facility]  Stress: No Stress Concern Present (04/15/2021)   Received from [facility], [facility]  Tobacco Use: Medium Risk (04/25/2023)  Health Literacy: Medium Risk (04/15/2021)   [facility] from [facility], [facility]     Readmission Risk Interventions     No data to display

## 2023-04-26 NOTE — Discharge Summary (Signed)
 Physician Discharge Summary   Patient ID: Diana Patterson MRN: [id number] DOB/AGE: 1946/01/03 78 y.o.  Admit date: 04/25/2023 Discharge date: 04/26/2023  Primary Diagnosis: Osteoarthritis of the left hip    Admission Diagnoses:  Past Medical History:  Diagnosis Date   Arthritis    CAD (coronary artery disease)    DES to circumflex June 2017, moderate residual LAD disease   CKD (chronic kidney disease) stage 3, GFR 30-59 ml/min (HCC)    Essential hypertension    Mixed hyperlipidemia    Muscle cramps at night    NSTEMI (non-ST elevated myocardial infarction) (HCC) 2016   Probable type II event, negative Cardiolite 2012   Sleep apnea    no onger uses cpap   TIA (transient ischemic attack)    Discharge Diagnoses:   Principal Problem:   OA (osteoarthritis) of hip Active Problems:   Primary osteoarthritis of left hip  Estimated body mass index is 32.28 kg/m as calculated from the following:   Height as of this encounter: 5' 5 (1.651 m).   Weight as of this encounter: 88 kg.  Procedure:  Procedure(s) (LRB): TOTAL HIP ARTHROPLASTY ANTERIOR APPROACH (Left)   Consults: None  HPI: Diana Patterson is a 78 y.o. female who has advanced end-stage arthritis of their Left  hip with progressively worsening pain and dysfunction.The patient has failed nonoperative management and presents for total hip arthroplasty.   Laboratory Data: Admission on 04/25/2023, Discharged on 04/26/2023  Component Date Value Ref Range Status   WBC 04/26/2023 9.3  4.0 - 10.5 K/uL Final   RBC 04/26/2023 2.88 (L)  3.87 - 5.11 MIL/uL Final   Hemoglobin 04/26/2023 8.3 (L)  12.0 - 15.0 g/dL Final   HCT 97/95/7974 26.0 (L)  36.0 - 46.0 % Final   MCV 04/26/2023 90.3  80.0 - 100.0 fL Final   MCH 04/26/2023 28.8  26.0 - 34.0 pg Final   MCHC 04/26/2023 31.9  30.0 - 36.0 g/dL Final   RDW 97/95/7974 19.5 (H)  11.5 - 15.5 % Final   Platelets 04/26/2023 119 (L)  150 - 400 K/uL Final   nRBC 04/26/2023 0.0   0.0 - 0.2 % Final   Performed at [facility], [address], [place], [place] [address]   Sodium 04/26/2023 138  135 - 145 mmol/L Final   Potassium 04/26/2023 4.2  3.5 - 5.1 mmol/L Final   Chloride 04/26/2023 108  98 - 111 mmol/L Final   CO2 04/26/2023 22  22 - 32 mmol/L Final   Glucose, Bld 04/26/2023 143 (H)  70 - 99 mg/dL Final   Glucose reference range applies only to samples taken after fasting for at least 8 hours.   BUN 04/26/2023 16  8 - 23 mg/dL Final   Creatinine, Ser 04/26/2023 0.94  0.44 - 1.00 mg/dL Final   Calcium  04/26/2023 8.6 (L)  8.9 - 10.3 mg/dL Final   GFR, Estimated 04/26/2023 >60  >60 mL/min Final   Comment: (NOTE) Calculated using the CKD-EPI Creatinine Equation (2021)    Anion gap 04/26/2023 8  5 - 15 Final   Performed at [facility], [address], [place], [place] [address]  Appointment on 04/19/2023  Component Date Value Ref Range Status   S' Lateral 04/19/2023 1.90  cm Final   Area-P 1/2 04/19/2023 2.45  cm2 Final   Single Plane A2C EF 04/19/2023 62.4  % Final   Single Plane A4C EF 04/19/2023 61.3  % Final   Calc EF 04/19/2023  61.7  % Final   Ao pk vel 04/19/2023 1.99  m/s Final   AV Mean grad 04/19/2023 8.0  mmHg Final   AV Peak grad 04/19/2023 15.8  mmHg Final   Est EF 04/19/2023 60 - 65%   Final  Hospital Outpatient Visit on 04/12/2023  Component Date Value Ref Range Status   WBC 04/12/2023 5.8  4.0 - 10.5 K/uL Final   RBC 04/12/2023 3.57 (L)  3.87 - 5.11 MIL/uL Final   Hemoglobin 04/12/2023 10.4 (L)  12.0 - 15.0 g/dL Final   HCT 98/78/7974 32.0 (L)  36.0 - 46.0 % Final   MCV 04/12/2023 89.6  80.0 - 100.0 fL Final   MCH 04/12/2023 29.1  26.0 - 34.0 pg Final   MCHC 04/12/2023 32.5  30.0 - 36.0 g/dL Final   RDW 98/78/7974 20.8 (H)  11.5 - 15.5 % Final   Platelets 04/12/2023 204  150 - 400 K/uL Final   nRBC 04/12/2023 0.0  0.0 - 0.2 % Final   Performed at [facility], [address] [place], [place] [address]   Sodium 04/12/2023 139  135 - 145 mmol/L Final   Potassium 04/12/2023 4.7  3.5 - 5.1 mmol/L Final   Chloride 04/12/2023 110  98 - 111 mmol/L Final   CO2 04/12/2023 21 (L)  22 - 32 mmol/L Final   Glucose, Bld 04/12/2023 116 (H)  70 - 99 mg/dL Final   Glucose reference range applies only to samples taken after fasting for at least 8 hours.   BUN 04/12/2023 17  8 - 23 mg/dL Final   Creatinine, Ser 04/12/2023 1.04 (H)  0.44 - 1.00 mg/dL Final   Calcium  04/12/2023 9.1  8.9 - 10.3 mg/dL Final   Total Protein 98/78/7974 7.0  6.5 - 8.1 g/dL Final   Albumin  04/12/2023 3.6  3.5 - 5.0 g/dL Final   AST 98/78/7974 53 (H)  15 - 41 U/L Final   ALT 04/12/2023 40  0 - 44 U/L Final   Alkaline Phosphatase 04/12/2023 147 (H)  38 - 126 U/L Final   Total Bilirubin 04/12/2023 1.0  0.0 - 1.2 mg/dL Final   GFR, Estimated 04/12/2023 55 (L)  >60 mL/min Final   Comment: (NOTE) Calculated using the CKD-EPI Creatinine Equation (2021)    Anion gap 04/12/2023 8  5 - 15 Final   Performed at [facility], [address], [place], [place] [address]   ABO/RH(D) 04/12/2023 A POS   Final   Antibody Screen 04/12/2023 NEG   Final   Sample Expiration 04/12/2023 04/26/2023,2359   Final   Extend sample reason 04/12/2023    Final                   Value:NO TRANSFUSIONS OR PREGNANCY IN THE PAST 3 MONTHS Performed at [facility], [address], [place], [place] [address]    Hgb A1c MFr Bld 04/12/2023 5.2  4.8 - 5.6 % Final   Comment: (NOTE) Pre diabetes:          5.7%-6.4%  Diabetes:              >6.4%  Glycemic control for   <7.0% adults with diabetes    Mean Plasma Glucose 04/12/2023 102.54  mg/dL Final   Performed at [facility], [address] [place], [place] [address]   MRSA, PCR 04/12/2023 NEGATIVE  NEGATIVE Final   Staphylococcus aureus 04/12/2023 NEGATIVE  NEGATIVE Final   Comment: (NOTE)  The Xpert SA Assay (FDA approved for NASAL  specimens in patients 61 years of age and older), is one component of a comprehensive surveillance program. It is not intended to diagnose infection nor to guide or monitor treatment. Performed at [facility], [address] [place], [place] [address]    Glucose-Capillary 04/12/2023 121 (H)  70 - 99 mg/dL Final   Glucose reference range applies only to samples taken after fasting for at least 8 hours.     X-Rays:DG Pelvis Portable Result Date: 04/25/2023 CLINICAL DATA:  Status post total left hip arthroplasty. EXAM: PORTABLE PELVIS 1-2 VIEWS COMPARISON:  AP view of the bilateral hips 02/09/2021 FINDINGS: Interval total left hip arthroplasty. Redemonstration of total right hip arthroplasty. No perihardware lucency is seen to indicate hardware failure or loosening. Moderate left hip subcutaneous air, normal postsurgical change. Minimal superior pubic symphysis degenerative spurring. No acute fracture or dislocation. IMPRESSION: Interval total left hip arthroplasty without evidence of hardware failure. Electronically Signed   By: Tanda Lyons M.D.   On: 04/25/2023 15:31   DG HIP UNILAT WITH PELVIS 1V LEFT Result Date: 04/25/2023 CLINICAL DATA:  Total left hip arthroplasty.  Elective surgery. EXAM: DG HIP (WITH OR WITHOUT PELVIS) 1V*L* COMPARISON:  Intraoperative fluoroscopy for total right hip arthroplasty 02/09/2021, CT abdomen and pelvis 07/15/2016 FINDINGS: Images were performed intraoperatively without the presence of a radiologist. Severe superior left femoroacetabular joint space narrowing with mild-to-moderate superior femoral head bone loss. The patient is undergoing total left hip arthroplasty. Partial visualization of prior total right hip arthroplasty. No hardware complication is seen. Total fluoroscopy images: 6 Total fluoroscopy time: 7 seconds Total dose: Radiation Exposure Index (as provided by the fluoroscopic device): 1.05 mGy air Kerma Please see intraoperative  findings for further detail. IMPRESSION: Intraoperative fluoroscopy for total left hip arthroplasty. Electronically Signed   By: Tanda Lyons M.D.   On: 04/25/2023 15:30   DG C-Arm 1-60 Min-No Report Result Date: 04/25/2023 Fluoroscopy was utilized by the requesting physician.  No radiographic interpretation.   DG C-Arm 1-60 Min-No Report Result Date: 04/25/2023 Fluoroscopy was utilized by the requesting physician.  No radiographic interpretation.   ECHOCARDIOGRAM COMPLETE Result Date: 04/20/2023    ECHOCARDIOGRAM REPORT   Patient Name:   Diana Patterson Date of Exam: 04/19/2023 Medical Rec #:  [id number]              Height:       65.0 in Accession #:    [id number]             Weight:       195.0 lb Date of Birth:  09/04/1945              BSA:          1.957 m Patient Age:    4 years               BP:           160/58 mmHg Patient Gender: F                      HR:           68 bpm. Exam Location:  [facility] Procedure: 2D Echo, Cardiac Doppler and Color Doppler Indications:    R01.1 Murmur  History:        Patient has prior history of Echocardiogram examinations, most  recent 08/25/2015. CAD; Risk Factors:Obesity, Hypertension,                 Dyslipidemia, Diabetes and Former Smoker.  Sonographer:    Bascom Burows RCS, RVS Referring Phys: [phone] [facility]  Sonographer Comments: Global longitudinal strain was attempted. IMPRESSIONS  1. Left ventricular ejection fraction, by estimation, is 60 to 65%. The left ventricle has normal function. The left ventricle has no regional wall motion abnormalities. Left ventricular diastolic parameters are consistent with Grade I diastolic dysfunction (impaired relaxation). Elevated left ventricular end-diastolic pressure.  2. Right ventricular systolic function is normal. The right ventricular size is normal. Tricuspid regurgitation signal is inadequate for assessing PA pressure.  3. Left atrial size was moderately dilated.  4. The mitral valve is  abnormal. Trivial mitral valve regurgitation. No evidence of mitral stenosis. Moderate to severe mitral annular calcification.  5. The aortic valve was not well visualized. Aortic valve regurgitation is not visualized. Aortic valve sclerosis/calcification is present, without any evidence of aortic stenosis. Aortic valve mean gradient measures 8.0 mmHg. Aortic valve Vmax measures  1.99 m/s.  6. The inferior vena cava is normal in size with greater than 50% respiratory variability, suggesting right atrial pressure of 3 mmHg. Comparison(s): No prior Echocardiogram. FINDINGS  Left Ventricle: Left ventricular ejection fraction, by estimation, is 60 to 65%. The left ventricle has normal function. The left ventricle has no regional wall motion abnormalities. The left ventricular internal cavity size was normal in size. There is  no left ventricular hypertrophy. Left ventricular diastolic parameters are consistent with Grade I diastolic dysfunction (impaired relaxation). Elevated left ventricular end-diastolic pressure. Right Ventricle: The right ventricular size is normal. No increase in right ventricular wall thickness. Right ventricular systolic function is normal. Tricuspid regurgitation signal is inadequate for assessing PA pressure. Left Atrium: Left atrial size was moderately dilated. Right Atrium: Right atrial size was normal in size. Pericardium: There is no evidence of pericardial effusion. Mitral Valve: The mitral valve is abnormal. Moderate to severe mitral annular calcification. Trivial mitral valve regurgitation. No evidence of mitral valve stenosis. MV peak gradient, 9.4 mmHg. The mean mitral valve gradient is 4.0 mmHg. Tricuspid Valve: The tricuspid valve is normal in structure. Tricuspid valve regurgitation is trivial. No evidence of tricuspid stenosis. Aortic Valve: The aortic valve was not well visualized. Aortic valve regurgitation is not visualized. Aortic valve sclerosis/calcification is present,  without any evidence of aortic stenosis. Aortic valve mean gradient measures 8.0 mmHg. Aortic valve peak gradient measures 15.8 mmHg. Pulmonic Valve: The pulmonic valve was not well visualized. Pulmonic valve regurgitation is not visualized. No evidence of pulmonic stenosis. Aorta: The aortic root and ascending aorta are structurally normal, with no evidence of dilitation. Venous: The inferior vena cava is normal in size with greater than 50% respiratory variability, suggesting right atrial pressure of 3 mmHg. IAS/Shunts: No atrial level shunt detected by color flow Doppler.  LEFT VENTRICLE PLAX 2D LVIDd:         3.10 cm     Diastology LVIDs:         1.90 cm     LV e' medial:    4.24 cm/s LV PW:         1.20 cm     LV E/e' medial:  27.8 LV IVS:        1.00 cm     LV e' lateral:   7.51 cm/s  LV E/e' lateral: 15.7  LV Volumes (MOD) LV vol d, MOD A2C: 56.4 ml LV vol d, MOD A4C: 74.1 ml LV vol s, MOD A2C: 21.2 ml LV vol s, MOD A4C: 28.7 ml LV SV MOD A2C:     35.2 ml LV SV MOD A4C:     74.1 ml LV SV MOD BP:      40.3 ml RIGHT VENTRICLE RV Basal diam:  3.00 cm RV Mid diam:    2.10 cm RV S prime:     11.30 cm/s RVOT diam:      2.00 cm TAPSE (M-mode): 2.4 cm LEFT ATRIUM             Index        RIGHT ATRIUM           Index LA diam:        3.90 cm 1.99 cm/m   RA Area:     12.70 cm LA Vol (A2C):   64.2 ml 32.81 ml/m  RA Volume:   26.70 ml  13.64 ml/m LA Vol (A4C):   87.7 ml 44.81 ml/m LA Biplane Vol: 78.2 ml 39.96 ml/m  AORTIC VALVE                    PULMONIC VALVE AV Vmax:           199.00 cm/s  PV Vmax:       1.06 m/s AV Vmean:          128.000 cm/s PV Peak grad:  4.5 mmHg AV VTI:            0.429 m AV Peak Grad:      15.8 mmHg AV Mean Grad:      8.0 mmHg LVOT Vmax:         103.00 cm/s LVOT Vmean:        66.500 cm/s LVOT VTI:          0.247 m LVOT/AV VTI ratio: 0.58  AORTA Ao Root diam: 3.00 cm Ao Asc diam:  3.00 cm MITRAL VALVE MV Area (PHT): 2.45 cm     SHUNTS MV Peak grad:  9.4 mmHg      Systemic VTI:  0.25 m MV Mean grad:  4.0 mmHg     Pulmonic Diam: 2.00 cm MV Vmax:       1.53 m/s MV Vmean:      87.9 cm/s MV Decel Time: 310 msec MV E velocity: 118.00 cm/s MV A velocity: 148.00 cm/s MV E/A ratio:  0.80 Vishnu Priya Mallipeddi Electronically signed by Diannah Late Mallipeddi Signature Date/Time: 04/20/2023/12:38:46 PM    Final     EKG: Orders placed or performed in visit on 07/02/22   EKG 12-Lead     Hospital Course: Diana Patterson is a 78 y.o. who was admitted to [facility]. They were brought to the operating room on 04/25/2023 and underwent Procedure(s): TOTAL HIP ARTHROPLASTY ANTERIOR APPROACH.  Patient tolerated the procedure well and was later transferred to the recovery room and then to the orthopaedic floor for postoperative care. They were given PO and IV analgesics for pain control following their surgery. They were given 24 hours of postoperative antibiotics of  Anti-infectives (From admission, onward)    Start     Dose/Rate Route Frequency Ordered Stop   04/25/23 1921  ceFAZolin  (ANCEF ) 2-4 GM/100ML-% IVPB       Note to Pharmacy: Levora Roch L: cabinet override      04/25/23 1921  04/26/23 0729   04/25/23 1845  ceFAZolin  (ANCEF ) IVPB 2g/100 mL premix        2 g 200 mL/hr over 30 Minutes Intravenous Every 6 hours 04/25/23 1836 04/26/23 0030   04/25/23 1015  ceFAZolin  (ANCEF ) IVPB 2g/100 mL premix        2 g 200 mL/hr over 30 Minutes Intravenous On call to O.R. 04/25/23 1000 04/25/23 1250     and started on DVT prophylaxis in the form of Aspirin .   PT and OT were ordered for total joint protocol. Discharge planning consulted to help with postop disposition and equipment needs.  Patient had a fair night on the evening of surgery. Pt was seen during rounds and was ready to go home pending progress with therapy. She worked with therapy on POD #1 and was meeting her goals. Pt was discharged to home later that day in stable condition.  Diet: Regular  diet Activity: WBAT Follow-up: in 2 weeks Disposition: Home Discharged Condition: stable   Discharge Instructions     Call MD / Call 911   Complete by: As directed    If you experience chest pain or shortness of breath, CALL 911 and be transported to the hospital emergency room.  If you develope a fever above 101 F, pus (white drainage) or increased drainage or redness at the wound, or calf pain, call your surgeon's office.   Change dressing   Complete by: As directed    You have an adhesive waterproof bandage over the incision. Leave this in place until your first follow-up appointment. Once you remove this you will not need to place another bandage.   Constipation Prevention   Complete by: As directed    Drink plenty of fluids.  Prune juice may be helpful.  You may use a stool softener, such as Colace (over the counter) 100 mg twice a day.  Use MiraLax  (over the counter) for constipation as needed.   Diet - low sodium heart healthy   Complete by: As directed    Do not sit on low chairs, stoools or toilet seats, as it may be difficult to get up from low surfaces   Complete by: As directed    Driving restrictions   Complete by: As directed    No driving for two weeks   Post-operative opioid taper instructions:   Complete by: As directed    POST-OPERATIVE OPIOID TAPER INSTRUCTIONS: It is important to wean off of your opioid medication as soon as possible. If you do not need pain medication after your surgery it is ok to stop day one. Opioids include: Codeine, Hydrocodone (Norco, Vicodin), Oxycodone(Percocet, oxycontin) and hydromorphone  amongst others.  Long term and even short term use of opiods can cause: Increased pain response Dependence Constipation Depression Respiratory depression And more.  Withdrawal symptoms can include Flu like symptoms Nausea, vomiting And more Techniques to manage these symptoms Hydrate well Eat regular healthy meals Stay active Use relaxation  techniques(deep breathing, meditating, yoga) Do Not substitute Alcohol to help with tapering If you have been on opioids for less than two weeks and do not have pain than it is ok to stop all together.  Plan to wean off of opioids This plan should start within one week post op of your joint replacement. Maintain the same interval or time between taking each dose and first decrease the dose.  Cut the total daily intake of opioids by one tablet each day Next start to increase the time between doses. The last  dose that should be eliminated is the evening dose.      TED hose   Complete by: As directed    Use stockings (TED hose) for three weeks on both leg(s).  You may remove them at night for sleeping.   Weight bearing as tolerated   Complete by: As directed       Allergies as of 04/26/2023       Reactions   Morphine  And Codeine Other (See Comments)   Drowsiness        Medication List     STOP taking these medications    meloxicam 15 MG tablet Commonly known as: MOBIC       TAKE these medications    acetaminophen  650 MG CR tablet Commonly known as: TYLENOL  Take 1,300 mg by mouth in the morning and at bedtime.   Aspirin  Low Dose 81 MG chewable tablet Generic drug: aspirin  Chew 1 tablet (81 mg total) by mouth 2 (two) times daily for 20 days. Then take one 81 mg aspirin  once a day for three weeks. Then discontinue aspirin . Start taking on: April 28, 2023   atorvastatin  80 MG tablet Commonly known as: LIPITOR  Take 1 tablet (80 mg total) by mouth daily at 6 PM.   Choline Fenofibrate  135 MG capsule Take 135 mg by mouth daily in the afternoon.   clotrimazole-betamethasone cream Commonly known as: LOTRISONE Apply 1 application  topically daily as needed (irritation).   Dexilant  30 MG capsule DR Generic drug: Dexlansoprazole  Take 30 mg by mouth daily with breakfast.   folic acid  1 MG tablet Commonly known as: FOLVITE  Take 1 mg by mouth See admin instructions.  Take 1 mg by mouth daily except for Saturday   furosemide  20 MG tablet Commonly known as: LASIX  Take 20 mg by mouth daily with breakfast.   HYDROcodone -acetaminophen  5-325 MG tablet Commonly known as: NORCO/VICODIN Take 1 tablet by mouth every 6 (six) hours as needed for severe pain (pain score 7-10).   iron  polysaccharides 150 MG capsule Commonly known as: NIFEREX Take 150 mg by mouth in the morning, at noon, and at bedtime.   levocetirizine 5 MG tablet Commonly known as: XYZAL Take 5 mg by mouth every evening.   meclizine 25 MG tablet Commonly known as: ANTIVERT Take 25 mg by mouth every 6 (six) hours as needed for dizziness.   metaxalone  800 MG tablet Commonly known as: SKELAXIN  Take 800 mg by mouth 2 (two) times daily.   methocarbamol  500 MG tablet Commonly known as: ROBAXIN  Take 1 tablet (500 mg total) by mouth every 6 (six) hours as needed for muscle spasms. What changed: Another medication with the same name was added. Make sure you understand how and when to take each.   methocarbamol  500 MG tablet Commonly known as: ROBAXIN  Take 1 tablet (500 mg total) by mouth every 6 (six) hours as needed for muscle spasms. What changed: You were already taking a medication with the same name, and this prescription was added. Make sure you understand how and when to take each.   methotrexate 2.5 MG tablet Commonly known as: RHEUMATREX Take 20 mg by mouth every Saturday.   metoprolol  tartrate 50 MG tablet Commonly known as: LOPRESSOR  Take 25 mg by mouth daily.   montelukast  10 MG tablet Commonly known as: SINGULAIR  Take 10 mg by mouth daily in the afternoon.   nitroGLYCERIN  0.4 MG SL tablet Commonly known as: NITROSTAT  Place 1 tablet (0.4 mg total) under the tongue every 5 (five) minutes  x 3 doses as needed for chest pain (if no relief after 2nd dose, proceed to ED or call 911).   ondansetron  4 MG disintegrating tablet Commonly known as: ZOFRAN -ODT Take 4 mg by mouth  every 4 (four) hours as needed for nausea/vomiting.   ondansetron  4 MG tablet Commonly known as: ZOFRAN  Take 1 tablet (4 mg total) by mouth every 6 (six) hours as needed for nausea.   potassium chloride  SA 20 MEQ tablet Commonly known as: KLOR-CON  M Take 20 mEq by mouth daily.   predniSONE 10 MG tablet Commonly known as: DELTASONE Take 10 mg by mouth daily.   promethazine -dextromethorphan 6.25-15 MG/5ML syrup Commonly known as: PROMETHAZINE -DM Take 5 mLs by mouth 4 (four) times daily as needed for cough.   traMADol  50 MG tablet Commonly known as: ULTRAM  Take 1-2 tablets (50-100 mg total) by mouth every 6 (six) hours as needed for moderate pain (pain score 4-6). What changed: when to take this   valACYclovir  500 MG tablet Commonly known as: VALTREX  Take 500 mg by mouth daily.               Discharge Care Instructions  (From admission, onward)           Start     Ordered   04/26/23 0000  Weight bearing as tolerated        04/26/23 0813   04/26/23 0000  Change dressing       Comments: You have an adhesive waterproof bandage over the incision. Leave this in place until your first follow-up appointment. Once you remove this you will not need to place another bandage.   04/26/23 0813            Follow-up Information     Melodi Lerner, MD. Go on 05/10/2023.   Specialty: Orthopedic Surgery Why: You are scheduled for first post op appt on Tuesday February 18 at 1:45pm. Contact information: [address] [place] [address] [phone]                 Signed: R. Zelda Kobs, PA-C Orthopedic Surgery 04/26/2023, 12:29 PM

## 2023-04-26 NOTE — Evaluation (Addendum)
 Physical Therapy Evaluation Patient Details Name: Diana Patterson MRN: [id number] DOB: 06/04/1945 Today's Date: 04/26/2023  History of Present Illness  Pt is a 78 y.o F s/p L THA 04/25/23. PMH includes arthritis, CAD, CKD, HTN, HLD, NSTEMI 2017, OSA, TIA.  Clinical Impression  Pt is s/p THA resulting in the deficits listed below (see PT Problem List). Pt sitting EOB on her own accord when PT arrives. Pt irequires CGA to come to standing from EOB with 2WW. Pt is able to complete 50 ft amb with 2WW at CGA-SBA. Pt requires increased time for mobility, states that she feels comfortable and confident with her ability to maintain safety with current home setup. Pt will benefit from acute skilled PT to increase their independence and safety with mobility to facilitate discharge.          If plan is discharge home, recommend the following: A little help with walking and/or transfers;A little help with bathing/dressing/bathroom;Assistance with cooking/housework;Assist for transportation   Can travel by private vehicle        Equipment Recommendations None recommended by PT  Recommendations for Other Services       Functional Status Assessment Patient has had a recent decline in their functional status and demonstrates the ability to make significant improvements in function in a reasonable and predictable amount of time.     Precautions / Restrictions Precautions Precautions: Fall Restrictions Weight Bearing Restrictions Per Provider Order: No LLE Weight Bearing Per Provider Order: Weight bearing as tolerated Other Position/Activity Restrictions: LLE      Mobility  Bed Mobility Overal bed mobility: Modified Independent             General bed mobility comments: sitting EOB when PT arrives    Transfers Overall transfer level: Needs assistance Equipment used: Rolling walker (2 wheels), 1 person hand held assist Transfers: Sit to/from Stand Sit to Stand: Contact guard  assist           General transfer comment: Pt requires HHA from lower sitting surface,CGA to come to standing    Ambulation/Gait Ambulation/Gait assistance: Contact guard assist Gait Distance (Feet): 50 Feet Assistive device: Rolling walker (2 wheels) Gait Pattern/deviations: Step-to pattern, Decreased weight shift to left, Decreased stance time - left Gait velocity: decreased     General Gait Details: increased UE WB through 2WW, good body position during directional change  Stairs            Wheelchair Mobility     Tilt Bed    Modified Rankin (Stroke Patients Only)       Balance Overall balance assessment: Needs assistance Sitting-balance support: No upper extremity supported Sitting balance-Leahy Scale: Good     Standing balance support: No upper extremity supported Standing balance-Leahy Scale: Fair                               Pertinent Vitals/Pain Pain Assessment Pain Assessment: 0-10 Pain Score: 7  Pain Location: L hip Pain Descriptors / Indicators: Sore, Tender Pain Intervention(s): Limited activity within patient's tolerance, Monitored during session, Repositioned, Premedicated before session, Ice applied    Home Living Family/patient expects to be discharged to:: Private residence Living Arrangements: Alone Available Help at Discharge: Family (niece staying 24/7) Type of Home: House Home Access: Ramped entrance       Home Layout: One level Home Equipment: Agricultural Consultant (2 wheels)      Prior Function Prior Level of Function : Independent/Modified  Independent             Mobility Comments: amb with RW, drives, ind in ADLs       Extremity/Trunk Assessment                Communication   Communication Communication: No apparent difficulties Cueing Techniques: Verbal cues  Cognition Arousal: Alert Behavior During Therapy: WFL for tasks assessed/performed Overall Cognitive Status: Within Functional Limits  for tasks assessed                                          General Comments      Exercises Total Joint Exercises Ankle Circles/Pumps: AROM, Both, 10 reps Quad Sets: AROM, 10 reps, Left Heel Slides: Left, 10 reps, AAROM Hip ABduction/ADduction: AAROM, 10 reps, Left Long Arc Quad: AROM, Left, 10 reps   Assessment/Plan    PT Assessment Patient needs continued PT services  PT Problem List Decreased strength;Decreased activity tolerance;Decreased mobility;Decreased balance;Decreased range of motion       PT Treatment Interventions Gait training;Functional mobility training;Therapeutic activities;Therapeutic exercise;Balance training;Patient/family education    PT Goals (Current goals can be found in the Care Plan section)  Acute Rehab PT Goals Patient Stated Goal: decrease pain PT Goal Formulation: With patient Time For Goal Achievement: 05/10/23 Potential to Achieve Goals: Good    Frequency 7X/week     Co-evaluation               AM-PAC PT 6 Clicks Mobility  Outcome Measure Help needed turning from your back to your side while in a flat bed without using bedrails?: None Help needed moving from lying on your back to sitting on the side of a flat bed without using bedrails?: A Little Help needed moving to and from a bed to a chair (including a wheelchair)?: A Little Help needed standing up from a chair using your arms (e.g., wheelchair or bedside chair)?: A Little Help needed to walk in hospital room?: A Little Help needed climbing 3-5 steps with a railing? : A Lot 6 Click Score: 18    End of Session Equipment Utilized During Treatment: Gait belt Activity Tolerance: Patient tolerated treatment well;Patient limited by pain Patient left: in chair;with call bell/phone within reach;with family/visitor present Nurse Communication: Mobility status PT Visit Diagnosis: Unsteadiness on feet (R26.81);Other abnormalities of gait and mobility (R26.89)     Time: 9088-9061 PT Time Calculation (min) (ACUTE ONLY): 27 min   Charges:   PT Evaluation $PT Eval Low Complexity: 1 Low PT Treatments $Gait Training: 8-22 mins PT General Charges $$ ACUTE PT VISIT: 1 Visit         Stann, PT Acute Rehabilitation Services Office: [phone] 04/26/2023\  Stann DELENA Ohara 04/26/2023, 10:05 AM

## 2023-04-26 NOTE — Progress Notes (Signed)
 TOC meds in a secure bag delivered to pt in room by this RN

## 2023-05-05 IMAGING — RF DG HIP (WITH OR WITHOUT PELVIS) 2-3V*R*
1 series · 8 of 8 positions shown · non-contrast
Comparison: None.

CLINICAL DATA: Right hip replacement.

EXAM:
DG HIP (WITH OR WITHOUT PELVIS) 2-3V RIGHT

[Series 1: unknown protocol · 0.20mm/px · 8 of 8 slices shown]
[im 1/8]
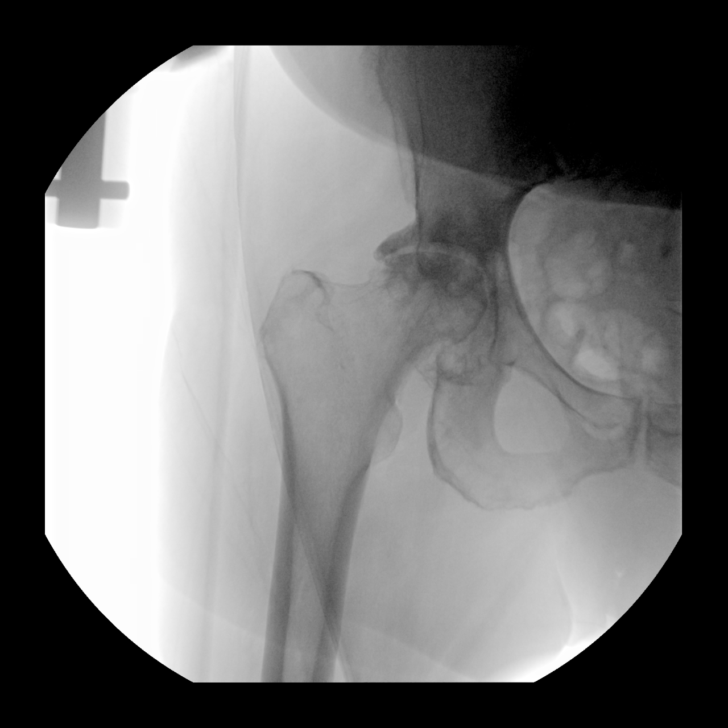
[im 2/8]
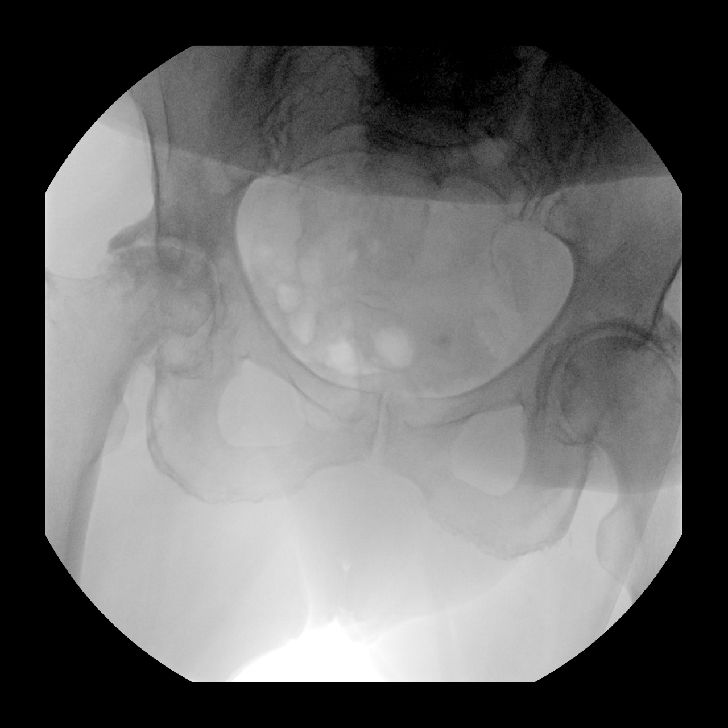
[im 3/8]
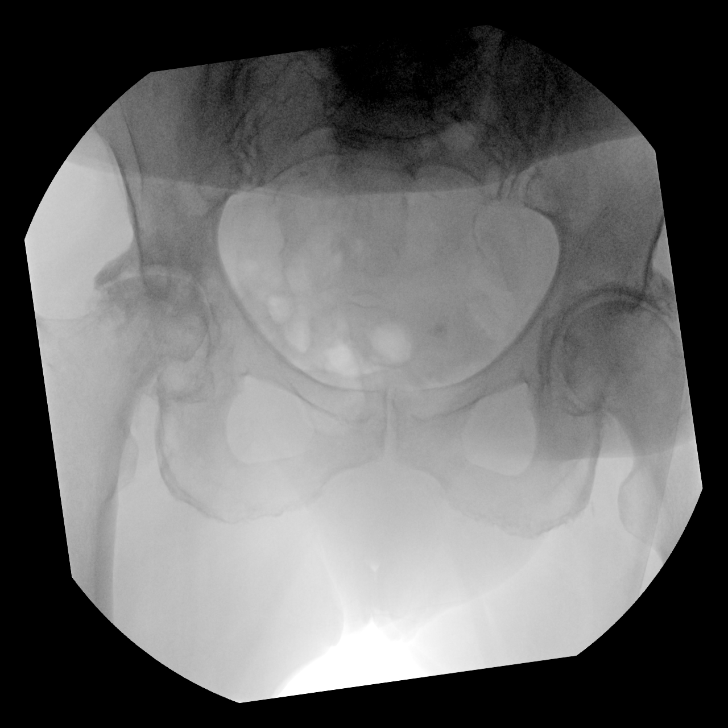
[im 4/8]
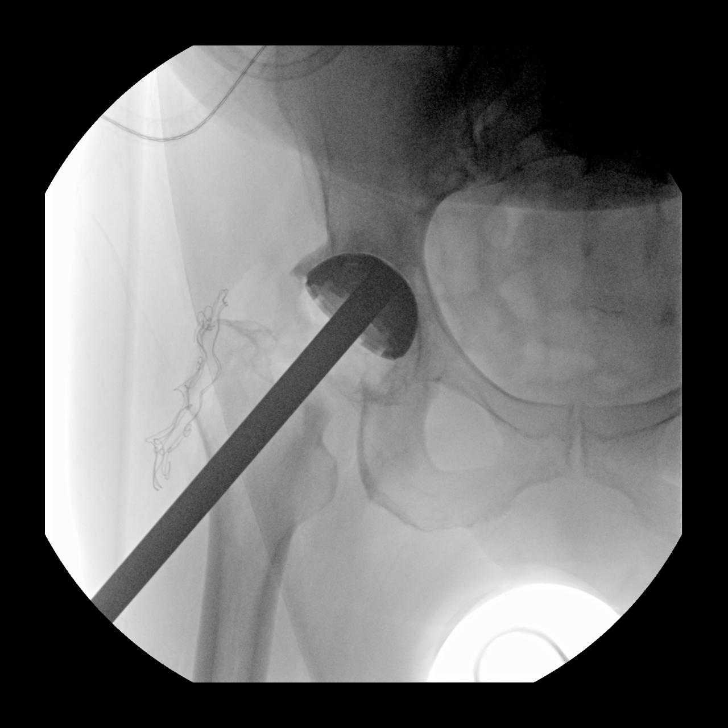
[im 5/8]
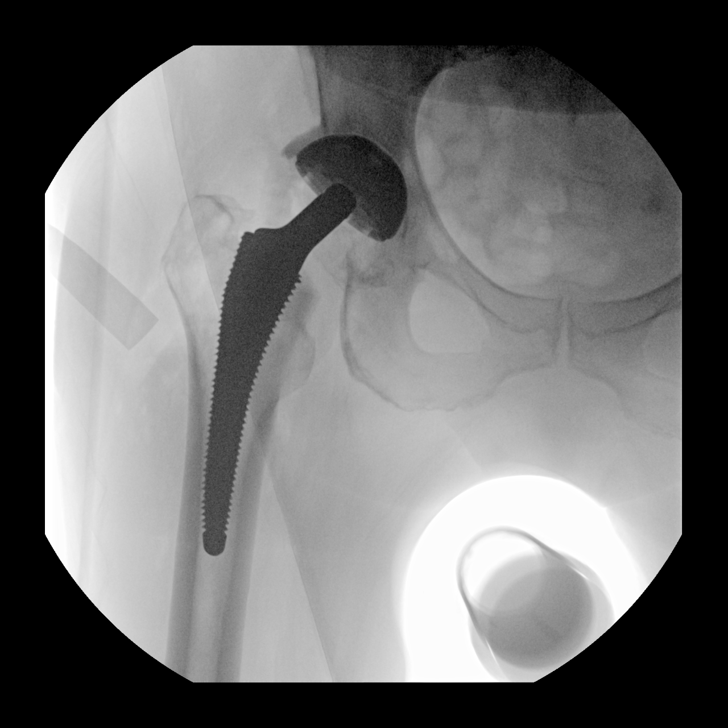
[im 6/8]
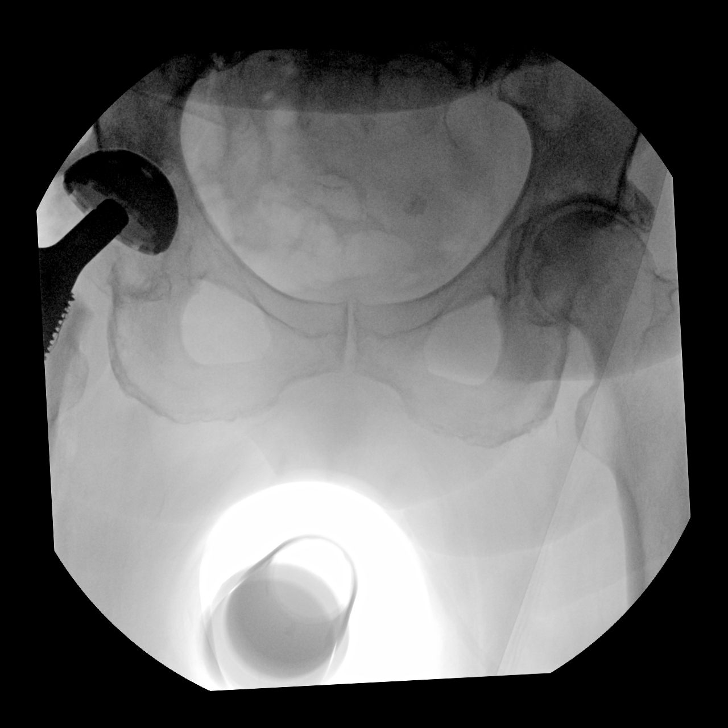
[im 7/8]
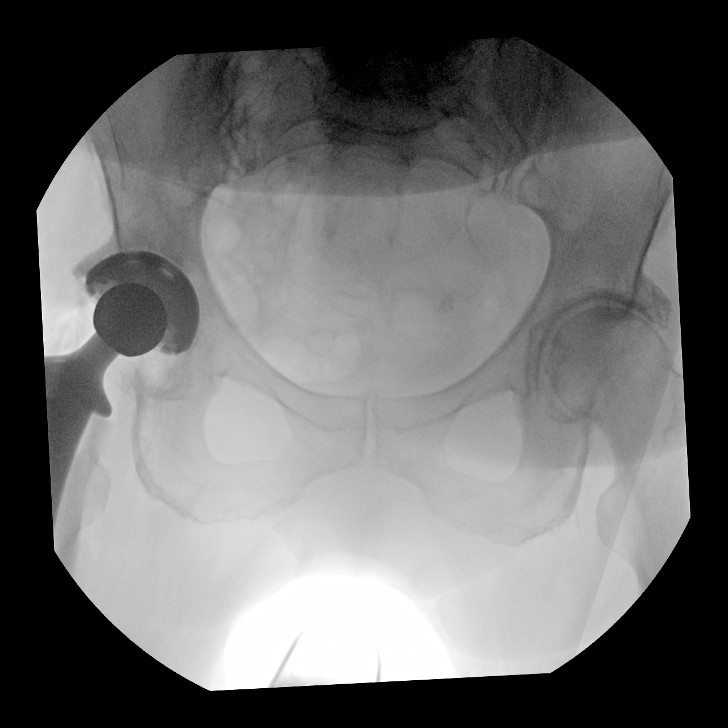
[im 8/8]
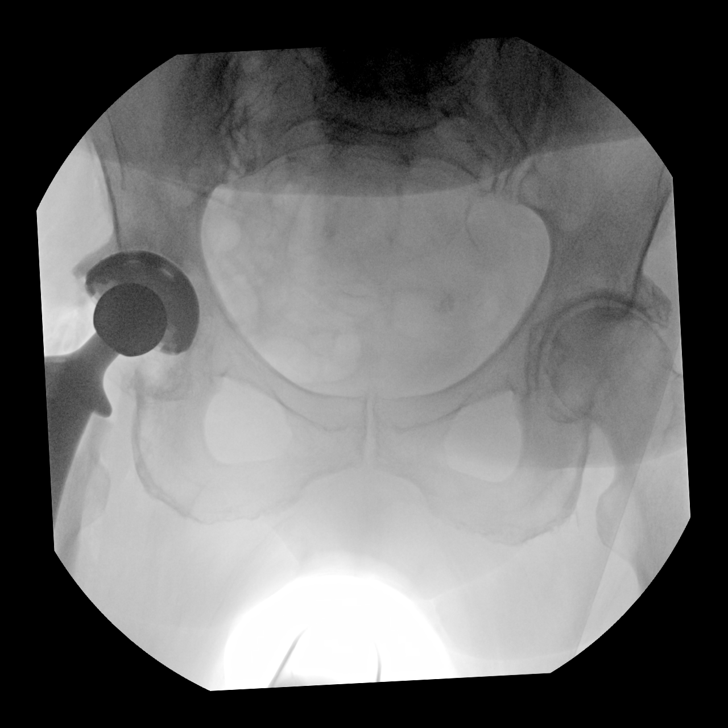

[8 of 8 positions shown; findings below may reference images not displayed]

FINDINGS: Eight fluoroscopic spot views of the pelvis and right hip obtained
in the operating room. Initial images demonstrate advanced right hip
osteoarthritis with subsequent right hip arthroplasty. Fluoroscopy
time 6 seconds.
IMPRESSION: Intraoperative fluoroscopy during right hip arthroplasty.

## 2023-05-05 IMAGING — DX DG PORTABLE PELVIS
1 series · 1 of 1 positions shown · non-contrast
Comparison: None.

CLINICAL DATA: Status post right hip replacement.

EXAM:
PORTABLE PELVIS 1-2 VIEWS

[pelvis ap]
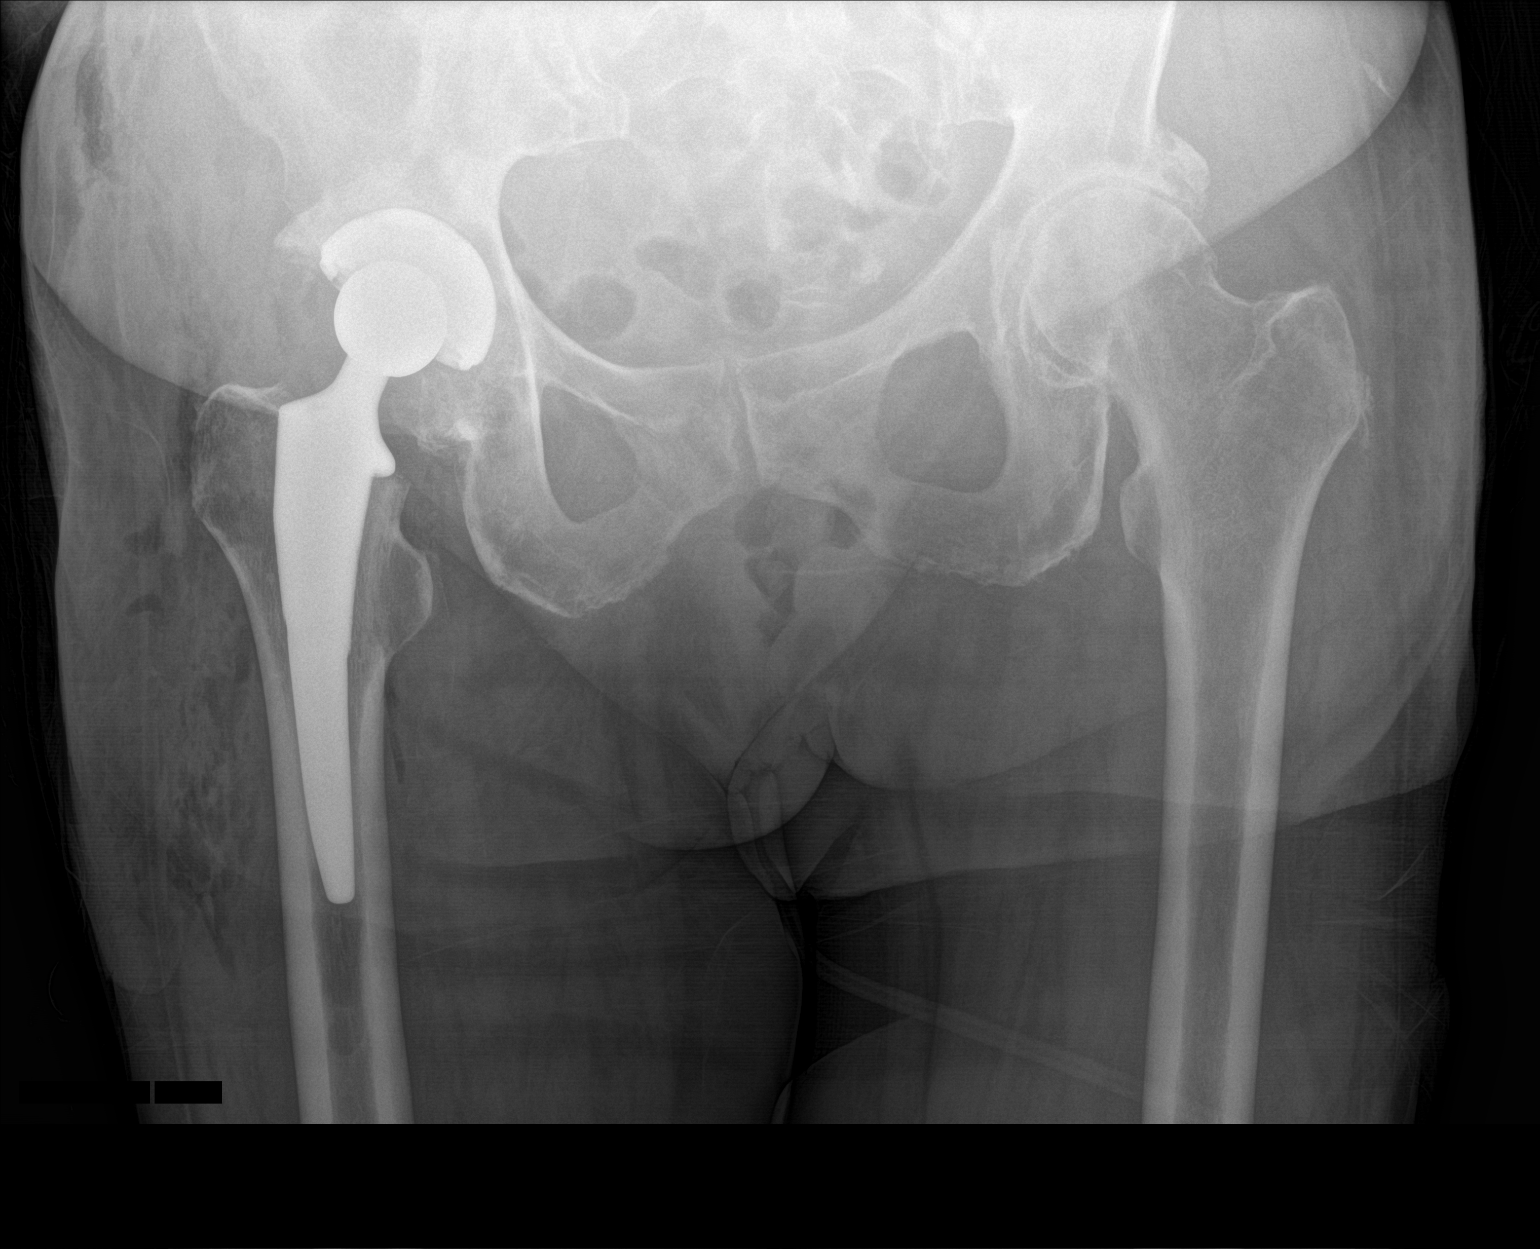

[1 of 1 positions shown; findings below may reference images not displayed]

FINDINGS: Right hip arthroplasty in expected alignment. No periprosthetic
lucency or fracture. Recent postsurgical change includes air and
edema in the soft tissues.
IMPRESSION: Right hip arthroplasty without immediate postoperative complication.

## 2023-05-09 DIAGNOSIS — N1832 Chronic kidney disease, stage 3b: Secondary | ICD-10-CM | POA: Diagnosis not present

## 2023-05-09 DIAGNOSIS — D631 Anemia in chronic kidney disease: Secondary | ICD-10-CM | POA: Diagnosis not present

## 2023-06-02 ENCOUNTER — Ambulatory Visit: Payer: Medicare Other | Admitting: Cardiology

## 2023-06-07 DIAGNOSIS — M1612 Unilateral primary osteoarthritis, left hip: Secondary | ICD-10-CM | POA: Diagnosis not present

## 2023-06-08 DIAGNOSIS — D631 Anemia in chronic kidney disease: Secondary | ICD-10-CM | POA: Diagnosis not present

## 2023-06-08 DIAGNOSIS — N1832 Chronic kidney disease, stage 3b: Secondary | ICD-10-CM | POA: Diagnosis not present

## 2023-06-29 DIAGNOSIS — R928 Other abnormal and inconclusive findings on diagnostic imaging of breast: Secondary | ICD-10-CM | POA: Diagnosis not present

## 2023-06-30 DIAGNOSIS — H3563 Retinal hemorrhage, bilateral: Secondary | ICD-10-CM | POA: Diagnosis not present

## 2023-07-06 DIAGNOSIS — I10 Essential (primary) hypertension: Secondary | ICD-10-CM | POA: Diagnosis not present

## 2023-07-06 DIAGNOSIS — D692 Other nonthrombocytopenic purpura: Secondary | ICD-10-CM | POA: Diagnosis not present

## 2023-07-06 DIAGNOSIS — E7849 Other hyperlipidemia: Secondary | ICD-10-CM | POA: Diagnosis not present

## 2023-07-06 DIAGNOSIS — M7551 Bursitis of right shoulder: Secondary | ICD-10-CM | POA: Diagnosis not present

## 2023-07-06 DIAGNOSIS — N1831 Chronic kidney disease, stage 3a: Secondary | ICD-10-CM | POA: Diagnosis not present

## 2023-07-06 DIAGNOSIS — E1122 Type 2 diabetes mellitus with diabetic chronic kidney disease: Secondary | ICD-10-CM | POA: Diagnosis not present

## 2023-07-06 DIAGNOSIS — I2510 Atherosclerotic heart disease of native coronary artery without angina pectoris: Secondary | ICD-10-CM | POA: Diagnosis not present

## 2023-07-12 DIAGNOSIS — I10 Essential (primary) hypertension: Secondary | ICD-10-CM | POA: Diagnosis not present

## 2023-07-12 DIAGNOSIS — E1122 Type 2 diabetes mellitus with diabetic chronic kidney disease: Secondary | ICD-10-CM | POA: Diagnosis not present

## 2023-08-02 DIAGNOSIS — M47816 Spondylosis without myelopathy or radiculopathy, lumbar region: Secondary | ICD-10-CM | POA: Diagnosis not present

## 2023-08-02 DIAGNOSIS — M7918 Myalgia, other site: Secondary | ICD-10-CM | POA: Diagnosis not present

## 2023-08-02 DIAGNOSIS — M545 Low back pain, unspecified: Secondary | ICD-10-CM | POA: Diagnosis not present

## 2023-08-08 DIAGNOSIS — G4762 Sleep related leg cramps: Secondary | ICD-10-CM | POA: Diagnosis not present

## 2023-08-08 DIAGNOSIS — I10 Essential (primary) hypertension: Secondary | ICD-10-CM | POA: Diagnosis not present

## 2023-08-09 DIAGNOSIS — E1122 Type 2 diabetes mellitus with diabetic chronic kidney disease: Secondary | ICD-10-CM | POA: Diagnosis not present

## 2023-08-09 DIAGNOSIS — I10 Essential (primary) hypertension: Secondary | ICD-10-CM | POA: Diagnosis not present

## 2023-08-25 DIAGNOSIS — M5416 Radiculopathy, lumbar region: Secondary | ICD-10-CM | POA: Diagnosis not present

## 2023-09-01 DIAGNOSIS — M47816 Spondylosis without myelopathy or radiculopathy, lumbar region: Secondary | ICD-10-CM | POA: Diagnosis not present

## 2023-09-01 DIAGNOSIS — M4807 Spinal stenosis, lumbosacral region: Secondary | ICD-10-CM | POA: Diagnosis not present

## 2023-09-01 DIAGNOSIS — Z79899 Other long term (current) drug therapy: Secondary | ICD-10-CM | POA: Diagnosis not present

## 2023-09-01 DIAGNOSIS — M792 Neuralgia and neuritis, unspecified: Secondary | ICD-10-CM | POA: Diagnosis not present

## 2023-09-01 DIAGNOSIS — M545 Low back pain, unspecified: Secondary | ICD-10-CM | POA: Diagnosis not present

## 2023-09-02 DIAGNOSIS — N1832 Chronic kidney disease, stage 3b: Secondary | ICD-10-CM | POA: Diagnosis not present

## 2023-09-02 DIAGNOSIS — D631 Anemia in chronic kidney disease: Secondary | ICD-10-CM | POA: Diagnosis not present

## 2023-09-08 DIAGNOSIS — N1832 Chronic kidney disease, stage 3b: Secondary | ICD-10-CM | POA: Diagnosis not present

## 2023-09-08 DIAGNOSIS — M0609 Rheumatoid arthritis without rheumatoid factor, multiple sites: Secondary | ICD-10-CM | POA: Diagnosis not present

## 2023-09-08 DIAGNOSIS — D631 Anemia in chronic kidney disease: Secondary | ICD-10-CM | POA: Diagnosis not present

## 2023-09-09 DIAGNOSIS — Z79631 Long term (current) use of antimetabolite agent: Secondary | ICD-10-CM | POA: Diagnosis not present

## 2023-09-09 DIAGNOSIS — K760 Fatty (change of) liver, not elsewhere classified: Secondary | ICD-10-CM | POA: Diagnosis not present

## 2023-09-09 DIAGNOSIS — N1832 Chronic kidney disease, stage 3b: Secondary | ICD-10-CM | POA: Diagnosis not present

## 2023-09-09 DIAGNOSIS — Z79899 Other long term (current) drug therapy: Secondary | ICD-10-CM | POA: Diagnosis not present

## 2023-09-09 DIAGNOSIS — D631 Anemia in chronic kidney disease: Secondary | ICD-10-CM | POA: Diagnosis not present

## 2023-09-12 DIAGNOSIS — N1832 Chronic kidney disease, stage 3b: Secondary | ICD-10-CM | POA: Diagnosis not present

## 2023-09-12 DIAGNOSIS — D631 Anemia in chronic kidney disease: Secondary | ICD-10-CM | POA: Diagnosis not present

## 2023-09-12 DIAGNOSIS — D649 Anemia, unspecified: Secondary | ICD-10-CM | POA: Diagnosis not present

## 2023-09-12 DIAGNOSIS — D500 Iron deficiency anemia secondary to blood loss (chronic): Secondary | ICD-10-CM | POA: Diagnosis not present

## 2023-09-14 DIAGNOSIS — M47816 Spondylosis without myelopathy or radiculopathy, lumbar region: Secondary | ICD-10-CM | POA: Diagnosis not present

## 2023-09-21 DIAGNOSIS — M47816 Spondylosis without myelopathy or radiculopathy, lumbar region: Secondary | ICD-10-CM | POA: Diagnosis not present

## 2023-09-27 DIAGNOSIS — N1831 Chronic kidney disease, stage 3a: Secondary | ICD-10-CM | POA: Diagnosis not present

## 2023-09-27 DIAGNOSIS — D500 Iron deficiency anemia secondary to blood loss (chronic): Secondary | ICD-10-CM | POA: Diagnosis not present

## 2023-09-28 DIAGNOSIS — D500 Iron deficiency anemia secondary to blood loss (chronic): Secondary | ICD-10-CM | POA: Diagnosis not present

## 2023-09-29 DIAGNOSIS — E1122 Type 2 diabetes mellitus with diabetic chronic kidney disease: Secondary | ICD-10-CM | POA: Diagnosis not present

## 2023-09-29 DIAGNOSIS — I10 Essential (primary) hypertension: Secondary | ICD-10-CM | POA: Diagnosis not present

## 2023-10-04 DIAGNOSIS — N1832 Chronic kidney disease, stage 3b: Secondary | ICD-10-CM | POA: Diagnosis not present

## 2023-10-04 DIAGNOSIS — K760 Fatty (change of) liver, not elsewhere classified: Secondary | ICD-10-CM | POA: Diagnosis not present

## 2023-10-04 DIAGNOSIS — Z79631 Long term (current) use of antimetabolite agent: Secondary | ICD-10-CM | POA: Diagnosis not present

## 2023-10-04 DIAGNOSIS — D500 Iron deficiency anemia secondary to blood loss (chronic): Secondary | ICD-10-CM | POA: Diagnosis not present

## 2023-10-04 DIAGNOSIS — D631 Anemia in chronic kidney disease: Secondary | ICD-10-CM | POA: Diagnosis not present

## 2023-10-04 DIAGNOSIS — D649 Anemia, unspecified: Secondary | ICD-10-CM | POA: Diagnosis not present

## 2023-10-24 DIAGNOSIS — H35033 Hypertensive retinopathy, bilateral: Secondary | ICD-10-CM | POA: Diagnosis not present

## 2023-10-25 DIAGNOSIS — N1832 Chronic kidney disease, stage 3b: Secondary | ICD-10-CM | POA: Diagnosis not present

## 2023-10-25 DIAGNOSIS — D631 Anemia in chronic kidney disease: Secondary | ICD-10-CM | POA: Diagnosis not present

## 2023-10-25 DIAGNOSIS — D500 Iron deficiency anemia secondary to blood loss (chronic): Secondary | ICD-10-CM | POA: Diagnosis not present

## 2023-10-27 DIAGNOSIS — K5730 Diverticulosis of large intestine without perforation or abscess without bleeding: Secondary | ICD-10-CM | POA: Diagnosis not present

## 2023-10-27 DIAGNOSIS — E1140 Type 2 diabetes mellitus with diabetic neuropathy, unspecified: Secondary | ICD-10-CM | POA: Diagnosis not present

## 2023-10-27 DIAGNOSIS — D509 Iron deficiency anemia, unspecified: Secondary | ICD-10-CM | POA: Diagnosis not present

## 2023-10-27 DIAGNOSIS — D649 Anemia, unspecified: Secondary | ICD-10-CM | POA: Diagnosis not present

## 2023-10-27 DIAGNOSIS — G4730 Sleep apnea, unspecified: Secondary | ICD-10-CM | POA: Diagnosis not present

## 2023-10-27 DIAGNOSIS — Z791 Long term (current) use of non-steroidal anti-inflammatories (NSAID): Secondary | ICD-10-CM | POA: Diagnosis not present

## 2023-10-27 DIAGNOSIS — K2950 Unspecified chronic gastritis without bleeding: Secondary | ICD-10-CM | POA: Diagnosis not present

## 2023-10-27 DIAGNOSIS — E1122 Type 2 diabetes mellitus with diabetic chronic kidney disease: Secondary | ICD-10-CM | POA: Diagnosis not present

## 2023-10-27 DIAGNOSIS — Z7982 Long term (current) use of aspirin: Secondary | ICD-10-CM | POA: Diagnosis not present

## 2023-10-27 DIAGNOSIS — N183 Chronic kidney disease, stage 3 unspecified: Secondary | ICD-10-CM | POA: Diagnosis not present

## 2023-10-27 DIAGNOSIS — D122 Benign neoplasm of ascending colon: Secondary | ICD-10-CM | POA: Diagnosis not present

## 2023-10-27 DIAGNOSIS — E785 Hyperlipidemia, unspecified: Secondary | ICD-10-CM | POA: Diagnosis not present

## 2023-10-27 DIAGNOSIS — Z885 Allergy status to narcotic agent status: Secondary | ICD-10-CM | POA: Diagnosis not present

## 2023-10-27 DIAGNOSIS — Z79899 Other long term (current) drug therapy: Secondary | ICD-10-CM | POA: Diagnosis not present

## 2023-10-27 DIAGNOSIS — D500 Iron deficiency anemia secondary to blood loss (chronic): Secondary | ICD-10-CM | POA: Diagnosis not present

## 2023-10-27 DIAGNOSIS — D175 Benign lipomatous neoplasm of intra-abdominal organs: Secondary | ICD-10-CM | POA: Diagnosis not present

## 2023-10-27 DIAGNOSIS — K219 Gastro-esophageal reflux disease without esophagitis: Secondary | ICD-10-CM | POA: Diagnosis not present

## 2023-10-27 DIAGNOSIS — I129 Hypertensive chronic kidney disease with stage 1 through stage 4 chronic kidney disease, or unspecified chronic kidney disease: Secondary | ICD-10-CM | POA: Diagnosis not present

## 2023-11-09 DIAGNOSIS — K921 Melena: Secondary | ICD-10-CM | POA: Diagnosis not present

## 2023-11-11 DIAGNOSIS — D649 Anemia, unspecified: Secondary | ICD-10-CM | POA: Diagnosis not present

## 2023-11-15 DIAGNOSIS — D649 Anemia, unspecified: Secondary | ICD-10-CM | POA: Diagnosis not present

## 2023-11-15 DIAGNOSIS — E1122 Type 2 diabetes mellitus with diabetic chronic kidney disease: Secondary | ICD-10-CM | POA: Diagnosis not present

## 2023-11-15 DIAGNOSIS — N1831 Chronic kidney disease, stage 3a: Secondary | ICD-10-CM | POA: Diagnosis not present

## 2023-11-15 DIAGNOSIS — D692 Other nonthrombocytopenic purpura: Secondary | ICD-10-CM | POA: Diagnosis not present

## 2023-11-15 DIAGNOSIS — E7849 Other hyperlipidemia: Secondary | ICD-10-CM | POA: Diagnosis not present

## 2023-11-15 DIAGNOSIS — I10 Essential (primary) hypertension: Secondary | ICD-10-CM | POA: Diagnosis not present

## 2023-11-15 DIAGNOSIS — N1832 Chronic kidney disease, stage 3b: Secondary | ICD-10-CM | POA: Diagnosis not present

## 2023-11-15 DIAGNOSIS — D631 Anemia in chronic kidney disease: Secondary | ICD-10-CM | POA: Diagnosis not present

## 2023-11-15 DIAGNOSIS — G4762 Sleep related leg cramps: Secondary | ICD-10-CM | POA: Diagnosis not present

## 2024-01-26 DIAGNOSIS — I509 Heart failure, unspecified: Secondary | ICD-10-CM | POA: Diagnosis not present

## 2024-01-26 DIAGNOSIS — Z1152 Encounter for screening for COVID-19: Secondary | ICD-10-CM | POA: Diagnosis not present

## 2024-01-26 DIAGNOSIS — J029 Acute pharyngitis, unspecified: Secondary | ICD-10-CM | POA: Diagnosis not present

## 2024-01-26 DIAGNOSIS — Z79899 Other long term (current) drug therapy: Secondary | ICD-10-CM | POA: Diagnosis not present

## 2024-01-26 DIAGNOSIS — R0989 Other specified symptoms and signs involving the circulatory and respiratory systems: Secondary | ICD-10-CM | POA: Diagnosis not present

## 2024-01-26 DIAGNOSIS — R0602 Shortness of breath: Secondary | ICD-10-CM | POA: Diagnosis not present

## 2024-01-26 DIAGNOSIS — K922 Gastrointestinal hemorrhage, unspecified: Secondary | ICD-10-CM | POA: Diagnosis not present

## 2024-01-26 DIAGNOSIS — R079 Chest pain, unspecified: Secondary | ICD-10-CM | POA: Diagnosis not present

## 2024-01-26 DIAGNOSIS — Z885 Allergy status to narcotic agent status: Secondary | ICD-10-CM | POA: Diagnosis not present

## 2024-01-26 DIAGNOSIS — E119 Type 2 diabetes mellitus without complications: Secondary | ICD-10-CM | POA: Diagnosis not present

## 2024-01-26 DIAGNOSIS — Z20822 Contact with and (suspected) exposure to covid-19: Secondary | ICD-10-CM | POA: Diagnosis not present

## 2024-01-26 DIAGNOSIS — I110 Hypertensive heart disease with heart failure: Secondary | ICD-10-CM | POA: Diagnosis not present

## 2024-01-26 DIAGNOSIS — Z7982 Long term (current) use of aspirin: Secondary | ICD-10-CM | POA: Diagnosis not present

## 2024-01-26 DIAGNOSIS — R0789 Other chest pain: Secondary | ICD-10-CM | POA: Diagnosis not present

## 2024-01-27 DIAGNOSIS — A0472 Enterocolitis due to Clostridium difficile, not specified as recurrent: Secondary | ICD-10-CM | POA: Diagnosis present

## 2024-01-27 DIAGNOSIS — I2510 Atherosclerotic heart disease of native coronary artery without angina pectoris: Secondary | ICD-10-CM | POA: Diagnosis present

## 2024-01-27 DIAGNOSIS — I110 Hypertensive heart disease with heart failure: Secondary | ICD-10-CM | POA: Diagnosis not present

## 2024-01-27 DIAGNOSIS — I5032 Chronic diastolic (congestive) heart failure: Secondary | ICD-10-CM | POA: Diagnosis present

## 2024-01-27 DIAGNOSIS — Z6830 Body mass index (BMI) 30.0-30.9, adult: Secondary | ICD-10-CM | POA: Diagnosis not present

## 2024-01-27 DIAGNOSIS — E1122 Type 2 diabetes mellitus with diabetic chronic kidney disease: Secondary | ICD-10-CM | POA: Diagnosis present

## 2024-01-27 DIAGNOSIS — D62 Acute posthemorrhagic anemia: Secondary | ICD-10-CM | POA: Diagnosis present

## 2024-01-27 DIAGNOSIS — K219 Gastro-esophageal reflux disease without esophagitis: Secondary | ICD-10-CM | POA: Diagnosis present

## 2024-01-27 DIAGNOSIS — K3189 Other diseases of stomach and duodenum: Secondary | ICD-10-CM | POA: Diagnosis present

## 2024-01-27 DIAGNOSIS — R161 Splenomegaly, not elsewhere classified: Secondary | ICD-10-CM | POA: Diagnosis present

## 2024-01-27 DIAGNOSIS — M069 Rheumatoid arthritis, unspecified: Secondary | ICD-10-CM | POA: Diagnosis present

## 2024-01-27 DIAGNOSIS — E119 Type 2 diabetes mellitus without complications: Secondary | ICD-10-CM | POA: Diagnosis not present

## 2024-01-27 DIAGNOSIS — R0789 Other chest pain: Secondary | ICD-10-CM | POA: Diagnosis not present

## 2024-01-27 DIAGNOSIS — D649 Anemia, unspecified: Secondary | ICD-10-CM | POA: Diagnosis not present

## 2024-01-27 DIAGNOSIS — I509 Heart failure, unspecified: Secondary | ICD-10-CM | POA: Diagnosis not present

## 2024-01-27 DIAGNOSIS — Z8679 Personal history of other diseases of the circulatory system: Secondary | ICD-10-CM | POA: Diagnosis not present

## 2024-01-27 DIAGNOSIS — K922 Gastrointestinal hemorrhage, unspecified: Secondary | ICD-10-CM | POA: Diagnosis not present

## 2024-01-27 DIAGNOSIS — D509 Iron deficiency anemia, unspecified: Secondary | ICD-10-CM | POA: Diagnosis not present

## 2024-01-27 DIAGNOSIS — I130 Hypertensive heart and chronic kidney disease with heart failure and stage 1 through stage 4 chronic kidney disease, or unspecified chronic kidney disease: Secondary | ICD-10-CM | POA: Diagnosis present

## 2024-01-27 DIAGNOSIS — K921 Melena: Secondary | ICD-10-CM | POA: Diagnosis present

## 2024-01-27 DIAGNOSIS — I8500 Esophageal varices without bleeding: Secondary | ICD-10-CM | POA: Diagnosis present

## 2024-01-27 DIAGNOSIS — D696 Thrombocytopenia, unspecified: Secondary | ICD-10-CM | POA: Diagnosis present

## 2024-01-27 DIAGNOSIS — K766 Portal hypertension: Secondary | ICD-10-CM | POA: Diagnosis present

## 2024-01-27 DIAGNOSIS — N2889 Other specified disorders of kidney and ureter: Secondary | ICD-10-CM | POA: Diagnosis not present

## 2024-01-27 DIAGNOSIS — E872 Acidosis, unspecified: Secondary | ICD-10-CM | POA: Diagnosis present

## 2024-01-27 DIAGNOSIS — K5730 Diverticulosis of large intestine without perforation or abscess without bleeding: Secondary | ICD-10-CM | POA: Diagnosis present

## 2024-01-27 DIAGNOSIS — N1831 Chronic kidney disease, stage 3a: Secondary | ICD-10-CM | POA: Diagnosis present

## 2024-01-27 DIAGNOSIS — R7989 Other specified abnormal findings of blood chemistry: Secondary | ICD-10-CM | POA: Diagnosis present

## 2024-01-27 DIAGNOSIS — J029 Acute pharyngitis, unspecified: Secondary | ICD-10-CM | POA: Diagnosis not present

## 2024-01-27 DIAGNOSIS — E785 Hyperlipidemia, unspecified: Secondary | ICD-10-CM | POA: Diagnosis present

## 2024-01-27 DIAGNOSIS — D631 Anemia in chronic kidney disease: Secondary | ICD-10-CM | POA: Diagnosis present

## 2024-01-27 DIAGNOSIS — K7460 Unspecified cirrhosis of liver: Secondary | ICD-10-CM | POA: Diagnosis present

## 2024-01-27 DIAGNOSIS — E669 Obesity, unspecified: Secondary | ICD-10-CM | POA: Diagnosis present

## 2024-01-27 DIAGNOSIS — K760 Fatty (change of) liver, not elsewhere classified: Secondary | ICD-10-CM | POA: Diagnosis present

## 2024-01-27 DIAGNOSIS — R109 Unspecified abdominal pain: Secondary | ICD-10-CM | POA: Diagnosis not present

## 2024-01-28 DIAGNOSIS — K3189 Other diseases of stomach and duodenum: Secondary | ICD-10-CM | POA: Diagnosis not present

## 2024-01-28 DIAGNOSIS — D62 Acute posthemorrhagic anemia: Secondary | ICD-10-CM | POA: Diagnosis not present

## 2024-01-28 DIAGNOSIS — K766 Portal hypertension: Secondary | ICD-10-CM | POA: Diagnosis not present

## 2024-01-28 DIAGNOSIS — K922 Gastrointestinal hemorrhage, unspecified: Secondary | ICD-10-CM | POA: Diagnosis not present

## 2024-01-28 DIAGNOSIS — R0789 Other chest pain: Secondary | ICD-10-CM | POA: Diagnosis not present

## 2024-01-28 DIAGNOSIS — A0472 Enterocolitis due to Clostridium difficile, not specified as recurrent: Secondary | ICD-10-CM | POA: Diagnosis not present

## 2024-01-28 DIAGNOSIS — I8500 Esophageal varices without bleeding: Secondary | ICD-10-CM | POA: Diagnosis not present

## 2024-01-28 DIAGNOSIS — R7989 Other specified abnormal findings of blood chemistry: Secondary | ICD-10-CM | POA: Diagnosis not present

## 2024-01-28 DIAGNOSIS — Z8679 Personal history of other diseases of the circulatory system: Secondary | ICD-10-CM | POA: Diagnosis not present

## 2024-01-29 DIAGNOSIS — D62 Acute posthemorrhagic anemia: Secondary | ICD-10-CM | POA: Diagnosis not present

## 2024-01-29 DIAGNOSIS — R0789 Other chest pain: Secondary | ICD-10-CM | POA: Diagnosis not present

## 2024-01-29 DIAGNOSIS — K3189 Other diseases of stomach and duodenum: Secondary | ICD-10-CM | POA: Diagnosis not present

## 2024-01-29 DIAGNOSIS — I8500 Esophageal varices without bleeding: Secondary | ICD-10-CM | POA: Diagnosis not present

## 2024-01-29 DIAGNOSIS — A0472 Enterocolitis due to Clostridium difficile, not specified as recurrent: Secondary | ICD-10-CM | POA: Diagnosis not present

## 2024-01-29 DIAGNOSIS — K766 Portal hypertension: Secondary | ICD-10-CM | POA: Diagnosis not present

## 2024-01-29 DIAGNOSIS — K922 Gastrointestinal hemorrhage, unspecified: Secondary | ICD-10-CM | POA: Diagnosis not present

## 2024-01-29 DIAGNOSIS — Z8679 Personal history of other diseases of the circulatory system: Secondary | ICD-10-CM | POA: Diagnosis not present

## 2024-01-29 DIAGNOSIS — R7989 Other specified abnormal findings of blood chemistry: Secondary | ICD-10-CM | POA: Diagnosis not present

## 2024-01-30 DIAGNOSIS — D62 Acute posthemorrhagic anemia: Secondary | ICD-10-CM | POA: Diagnosis not present

## 2024-01-30 DIAGNOSIS — K766 Portal hypertension: Secondary | ICD-10-CM | POA: Diagnosis not present

## 2024-01-30 DIAGNOSIS — K922 Gastrointestinal hemorrhage, unspecified: Secondary | ICD-10-CM | POA: Diagnosis not present

## 2024-01-30 DIAGNOSIS — Z8679 Personal history of other diseases of the circulatory system: Secondary | ICD-10-CM | POA: Diagnosis not present

## 2024-01-30 DIAGNOSIS — R0789 Other chest pain: Secondary | ICD-10-CM | POA: Diagnosis not present

## 2024-01-30 DIAGNOSIS — A0472 Enterocolitis due to Clostridium difficile, not specified as recurrent: Secondary | ICD-10-CM | POA: Diagnosis not present

## 2024-01-30 DIAGNOSIS — K3189 Other diseases of stomach and duodenum: Secondary | ICD-10-CM | POA: Diagnosis not present

## 2024-01-30 DIAGNOSIS — I8500 Esophageal varices without bleeding: Secondary | ICD-10-CM | POA: Diagnosis not present

## 2024-01-30 DIAGNOSIS — R7989 Other specified abnormal findings of blood chemistry: Secondary | ICD-10-CM | POA: Diagnosis not present

## 2024-01-31 DIAGNOSIS — K766 Portal hypertension: Secondary | ICD-10-CM | POA: Diagnosis not present

## 2024-01-31 DIAGNOSIS — D509 Iron deficiency anemia, unspecified: Secondary | ICD-10-CM | POA: Diagnosis not present

## 2024-01-31 DIAGNOSIS — I8500 Esophageal varices without bleeding: Secondary | ICD-10-CM | POA: Diagnosis not present

## 2024-01-31 DIAGNOSIS — D62 Acute posthemorrhagic anemia: Secondary | ICD-10-CM | POA: Diagnosis not present

## 2024-01-31 DIAGNOSIS — K3189 Other diseases of stomach and duodenum: Secondary | ICD-10-CM | POA: Diagnosis not present

## 2024-01-31 DIAGNOSIS — K7460 Unspecified cirrhosis of liver: Secondary | ICD-10-CM | POA: Diagnosis not present

## 2024-01-31 DIAGNOSIS — Z8679 Personal history of other diseases of the circulatory system: Secondary | ICD-10-CM | POA: Diagnosis not present

## 2024-01-31 DIAGNOSIS — A0472 Enterocolitis due to Clostridium difficile, not specified as recurrent: Secondary | ICD-10-CM | POA: Diagnosis not present

## 2024-02-01 ENCOUNTER — Telehealth: Payer: Self-pay

## 2024-02-01 NOTE — Transitions of Care (Post Inpatient/ED Visit) (Signed)
 02/01/2024  Name: Diana Patterson MRN: [id number] DOB: [birth year]  Today's TOC FU Call Status: Today's TOC FU Call Status:: Successful TOC FU Call Completed TOC FU Call Complete Date: 02/01/24  Patient's Name and Date of Birth confirmed. DOB, Name  Transition Care Management Follow-up Telephone Call Date of Discharge: 01/31/24 Discharge Facility: Other ([facility]) Name of Other ([facility]) Discharge Facility: [facility] Type of Discharge: Inpatient Admission Primary Inpatient Discharge Diagnosis:: Acute on chronic blood loss anemia, GI bleed How have you been since you were released from the hospital?: Better Any questions or concerns?: No  Items Reviewed: Did you receive and understand the discharge instructions provided?: Yes Medications obtained,verified, and reconciled?: Yes (Medications Reviewed) Any new allergies since your discharge?: No Dietary orders reviewed?: Yes Type of Diet Ordered:: Keep a low-salt/low-sodium diet, no more than 2,000 mg per day Do you have support at home?: Yes People in Home [RPT]: child(ren), adult, other relative(s) Name of Support/Comfort Primary Source: Patient states she has a lot of family support - lives alone  Medications Reviewed Today: Medications Reviewed Today     Reviewed by Lauro Shona LABOR, RN (Registered Nurse) on 02/01/24 at 1358  Med List Status: <None>   Medication Order Taking? Sig Documenting Provider Last Dose Status Informant  acetaminophen  (TYLENOL ) 650 MG CR tablet [id number] Yes Take 1,300 mg by mouth in the morning and at bedtime. [provider]  Active Self  aspirin  EC 81 MG tablet [id number] Yes Take 81 mg by mouth daily. Swallow whole. [provider]  Active   atorvastatin  (LIPITOR ) 80 MG tablet [id number] Yes Take 1 tablet (80 mg total) by mouth daily at 6 PM. Marcine Caffie HERO, PA-C  Active Self  carvedilol (COREG) 3.125 MG tablet [id number] Yes Take 3.125 mg by mouth 2 (two)  times daily with a meal. [provider]  Active   Choline Fenofibrate  135 MG capsule [id number] Yes Take 135 mg by mouth daily in the afternoon. [provider]  Active Self  clotrimazole-betamethasone (LOTRISONE) cream [id number] Yes Apply 1 application  topically daily as needed (irritation). [provider]  Active Self  Dexlansoprazole  (DEXILANT ) 30 MG capsule [id number]  Take 30 mg by mouth daily with breakfast.  Patient not taking: Reported on 02/01/2024   [provider]  Consider Medication Status and Discontinue (Discontinued by provider) Self           Med Note DELETA DEBBY JONELLE [place] Jan 29, 2021  8:53 AM)    diclofenac Sodium (VOLTAREN) 1 % GEL [id number] Yes Apply 4 g topically 4 (four) times daily. [provider]  Active   diphenoxylate-atropine (LOMOTIL) 2.5-0.025 MG tablet [id number] Yes Take 1 tablet by mouth 3 (three) times daily as needed for diarrhea or loose stools. [provider]  Active   folic acid  (FOLVITE ) 1 MG tablet [id number] Yes Take 1 mg by mouth See admin instructions. Take 1 mg by mouth daily except for Saturday [provider]  Active Self  furosemide  (LASIX ) 20 MG tablet [id number] Yes Take 20 mg by mouth daily with breakfast. [provider]  Active Self           Med Note NESTOR, EMILY M   Mon Apr 25, 2023 10:14 AM) Patient recalls she does take this last taken 3 wks ago  HYDROcodone -acetaminophen  (NORCO/VICODIN) 5-325 MG tablet [id number]  Take 1 tablet by mouth every 6 (six) hours as needed for severe pain (pain score 7-10).  Patient not taking: Reported  on 02/01/2024   Kristian Stabs, PA  Consider Medication Status and Discontinue (Completed Course)   iron  polysaccharides (NIFEREX) 150 MG capsule [id number] Yes Take 150 mg by mouth in the morning, at noon, and at bedtime. [provider]  Active Self           Med Note SOILA LYLE BROCKS   Tue Apr 05, 2023  2:24 PM)     levocetirizine (XYZAL) 5 MG tablet [id number] Yes Take 5 mg by mouth every evening. [provider]  Active   losartan -hydrochlorothiazide (HYZAAR) 50-12.5 MG tablet [id number] Yes Take 1 tablet by mouth daily. [provider]  Active   Magnesium  200 MG TABS [id number] Yes Take 2 tablets by mouth daily. [provider]  Active   meclizine (ANTIVERT) 25 MG tablet [id number] Yes Take 25 mg by mouth every 6 (six) hours as needed for dizziness. [provider]  Active Self  metaxalone  (SKELAXIN ) 800 MG tablet [id number]  Take 800 mg by mouth 2 (two) times daily.   Patient not taking: Reported on 02/01/2024   [provider]  Consider Medication Status and Discontinue (No longer needed (for PRN medications)) Self  methocarbamol  (ROBAXIN ) 500 MG tablet [id number]  Take 1 tablet (500 mg total) by mouth every 6 (six) hours as needed for muscle spasms.  Patient not taking: Reported on 02/01/2024   Reena Roxie CROME, [place]  Consider Medication Status and Discontinue (Completed Course)   methocarbamol  (ROBAXIN ) 500 MG tablet [id number]  Take 1 tablet (500 mg total) by mouth every 6 (six) hours as needed for muscle spasms.  Patient not taking: Reported on 02/01/2024   Kristian Stabs, PA  Consider Medication Status and Discontinue (Completed Course)   methotrexate (RHEUMATREX) 2.5 MG tablet [id number] Yes Take 20 mg by mouth every Saturday. [provider]  Active Self  metoprolol  tartrate (LOPRESSOR ) 50 MG tablet [id number] Yes Take 25 mg by mouth daily. [provider]  Active Self  montelukast  (SINGULAIR ) 10 MG tablet [id number] Yes Take 10 mg by mouth daily in the afternoon. [provider]  Active Self  nitroGLYCERIN  (NITROSTAT ) 0.4 MG SL tablet [id number]  Place 1 tablet (0.4 mg total) under the tongue every 5 (five) minutes x 3 doses as needed for chest pain (if no relief after 2nd dose, proceed to ED or call 911).  Patient not taking:  Reported on 02/01/2024   Miriam Norris, NP  Active            Med Note [facility] M   Mon Apr 25, 2023 10:16 AM) Never needed  nystatin (MYCOSTATIN/NYSTOP) powder [id number] Yes Apply 1 Application topically 4 (four) times daily as needed (for yeast). [provider]  Active   ondansetron  (ZOFRAN ) 4 MG tablet [id number] Yes Take 1 tablet (4 mg total) by mouth every 6 (six) hours as needed for nausea. Kristian Stabs, PA  Active   ondansetron  (ZOFRAN -ODT) 4 MG disintegrating tablet [id number]  Take 4 mg by mouth every 4 (four) hours as needed for nausea/vomiting.  Patient not taking: Reported on 02/01/2024   [provider]  Consider Medication Status and Discontinue (No longer needed (for PRN medications)) Self  potassium chloride  SA (KLOR-CON  M) 20 MEQ tablet [id number] Yes Take 20 mEq by mouth daily. [provider]  Active   predniSONE (DELTASONE) 10 MG tablet [id number]  Take 10 mg by mouth daily.  Patient not taking: Reported on 02/01/2024   [provider]  Consider Medication Status and Discontinue (Completed Course)  promethazine -dextromethorphan (PROMETHAZINE -DM) 6.25-15 MG/5ML syrup [id number]  Take 5 mLs by mouth 4 (four) times daily as needed for cough.  Patient not taking: Reported on 02/01/2024   [provider]  Consider Medication Status and Discontinue (No longer needed (for PRN medications))            Med Note [facility], EMILY M   Mon Apr 25, 2023 10:17 AM) Course completed  traMADol  (ULTRAM ) 50 MG tablet [id number] Yes Take 1-2 tablets (50-100 mg total) by mouth every 6 (six) hours as needed for moderate pain (pain score 4-6). Kristian Stabs, PA  Active   valACYclovir  (VALTREX ) 500 MG tablet [id number] Yes Take 500 mg by mouth daily. [provider]  Active Self  vancomycin (VANCOCIN) 125 MG capsule [id number] Yes Take 125 mg by mouth 4 (four) times daily. [provider]  Active             Home Care and  Equipment/Supplies: Were Home Health Services Ordered?: No Any new equipment or medical supplies ordered?: No  Functional Questionnaire: Do you need assistance with bathing/showering or dressing?: Yes (patient showers on her own but only when someone is present in the home) Do you need assistance with meal preparation?: Yes (patient's family is helping with meals) Do you need assistance with eating?: No Do you have difficulty maintaining continence: Yes (patient states she has had some accidents with her bowels and wears adult briefs) Do you need assistance with getting out of bed/getting out of a chair/moving?: No Do you have difficulty managing or taking your medications?: No  Follow up appointments reviewed: PCP Follow-up appointment confirmed?: Yes Date of PCP follow-up appointment?: 02/02/24 Follow-up Provider: Orpha Yancey LABOR, MD Specialist Hospital Follow-up appointment confirmed?: Yes Date of Specialist follow-up appointment?: 02/20/24 Follow-Up Specialty Provider:: GI Do you need transportation to your follow-up appointment?: No Do you understand care options if your condition(s) worsen?: Yes-patient verbalized understanding  SDOH Interventions Today    Flowsheet Row Most Recent Value  SDOH Interventions   Food Insecurity Interventions Intervention Not Indicated  Housing Interventions Intervention Not Indicated  Transportation Interventions Intervention Not Indicated  Utilities Interventions Intervention Not Indicated    Goals Addressed             This Visit's Progress    VBCI Transitions of Care (TOC) Care Plan       Problems:  Recent Hospitalization for treatment of Acute on chronic blood loss anemia, GI bleed - Admitted to [facility] 01/27/2024 - 01/31/2024 [place] leaving 02/04/24 and returning home 02/11/24 - agreed for [facility] RN to follow up with her while she's at the beach  Goal:  Over the next 30 days,  the patient will not experience hospital readmission  Interventions:  Transitions of Care: Doctor Visits  - discussed the importance of doctor visits Contacted provider for patient needs - Conference call was made with patient to schedule follow up with cardiology - patient is now scheduled 02/27/24  Educated on yeast management   Anemia/Bleeding Interventions:  Assessment of understanding of anemia/bleeding disorder diagnosis  Basic overview and discussion of anemia/bleeding disorder or acute disease state  Medications reviewed  Counseled on bleeding risk associated with Aspirin  (listed on discharge summary-patient will discuss with PCP tomorrow, 02/02/24 at hospital follow appointment) and importance of self-monitoring for signs/symptoms of bleeding Counseled on avoidance of NSAIDs due to increased bleeding risk Counseled on importance of regular laboratory monitoring as directed by provider Provided education  about signs and symptoms of active bleeding such as stomach discomfort, coughing up blood or blood tinged secretions, bleeding from the gums/teeth, nosebleeds, increased bruising, blood in the urine/stool and/or if a traumatic injury occurs, regardless of severity of injury  Advised to call provider or 911 if active bleeding or signs and symptoms of active bleeding occur Screening for signs and symptoms of depression related to chronic disease state Assessed social determinant of health barriers  Lab Results  Component Value Date   WBC 9.3 04/26/2023   HGB 8.3 (L) 04/26/2023   HCT 26.0 (L) 04/26/2023   MCV 90.3 04/26/2023   PLT 119 (L) 04/26/2023   Per 01/27/24 hospital record: WBC 4.0 - 10.5 10*9/L 5.0  RBC 3.80 - 5.10 10*12/L 2.12 Low   HGB 11.5 - 15.0 g/dL 6.5 Low Panic   HCT 65.9 - 44.0 % 18.9 Low Panic   MCV 80.0 - 98.0 fL 89.2  MCH 27.0 - 34.0 pg 30.7  MCHC 32.0 - 36.0 g/dL 65.5  RDW 88.4 - 85.4 % 17.7 High   MPV 7.4 - 10.4 fL 9.6  Platelet 140 - 415 10*9/L  124 Low     Per 01/31/2024 hospital record: WBC 4.0 - 10.5 thou/mcL 5.4  RBC 3.93 - 5.22 million/mcL 3.13 Low   HGB 11.2 - 15.7 gm/dL 9.0 Low   HCT 65.8 - 55.0 % 28.2 Low   MCV 79.4 - 94.8 fL 90.1  MCH 25.6 - 32.2 pg 28.8  MCHC 32.2 - 35.5 gm/dL 68.0 Low   Plt Ct 849 - 400 thou/mcL 132 Low     Patient Self Care Activities:  Attend all scheduled provider appointments Call pharmacy for medication refills 3-7 days in advance of running out of medications Call provider office for new concerns or questions  Notify RN Care Manager of TOC call rescheduling needs Participate in Transition of Care Program/Attend TOC scheduled calls Take medications as prescribed   Monitor for any signs/symptoms of bleeding and notify MD or seek medical treatment as needed Discuss medication questions with PCP including Aspirin  with bleed and alert related to Aspirin  and Methotrexate, discuss need for new Nitroglycerin  prescription since patient's has expired  Plan:  Telephone follow up appointment with care management team member scheduled for:  11/20/25am The patient has been provided with contact information for the care management team and has been advised to call with any health related questions or concerns.         Shona Prow RN, CCM [place]  VBCI-Population Health RN Care Manager [phone]

## 2024-02-02 DIAGNOSIS — H6123 Impacted cerumen, bilateral: Secondary | ICD-10-CM | POA: Diagnosis not present

## 2024-02-02 DIAGNOSIS — K7581 Nonalcoholic steatohepatitis (NASH): Secondary | ICD-10-CM | POA: Diagnosis not present

## 2024-02-03 DIAGNOSIS — K7581 Nonalcoholic steatohepatitis (NASH): Secondary | ICD-10-CM | POA: Diagnosis not present

## 2024-02-03 DIAGNOSIS — D649 Anemia, unspecified: Secondary | ICD-10-CM | POA: Diagnosis not present

## 2024-02-07 ENCOUNTER — Ambulatory Visit: Admitting: Cardiology

## 2024-02-07 ENCOUNTER — Encounter (INDEPENDENT_AMBULATORY_CARE_PROVIDER_SITE_OTHER): Payer: Self-pay | Admitting: *Deleted

## 2024-02-21 DIAGNOSIS — I10 Essential (primary) hypertension: Secondary | ICD-10-CM | POA: Diagnosis not present

## 2024-02-21 DIAGNOSIS — N1831 Chronic kidney disease, stage 3a: Secondary | ICD-10-CM | POA: Diagnosis not present

## 2024-02-21 DIAGNOSIS — I2510 Atherosclerotic heart disease of native coronary artery without angina pectoris: Secondary | ICD-10-CM | POA: Diagnosis not present

## 2024-02-21 DIAGNOSIS — E7849 Other hyperlipidemia: Secondary | ICD-10-CM | POA: Diagnosis not present

## 2024-02-21 DIAGNOSIS — E1122 Type 2 diabetes mellitus with diabetic chronic kidney disease: Secondary | ICD-10-CM | POA: Diagnosis not present

## 2024-02-21 DIAGNOSIS — H6123 Impacted cerumen, bilateral: Secondary | ICD-10-CM | POA: Diagnosis not present

## 2024-02-21 DIAGNOSIS — D692 Other nonthrombocytopenic purpura: Secondary | ICD-10-CM | POA: Diagnosis not present

## 2024-02-21 DIAGNOSIS — K7581 Nonalcoholic steatohepatitis (NASH): Secondary | ICD-10-CM | POA: Diagnosis not present

## 2024-02-27 ENCOUNTER — Ambulatory Visit: Attending: Cardiology | Admitting: Cardiology

## 2024-02-27 ENCOUNTER — Encounter: Payer: Self-pay | Admitting: Cardiology

## 2024-02-27 VITALS — BP 124/70 | HR 85 | Ht 66.0 in | Wt 184.2 lb

## 2024-02-27 DIAGNOSIS — I2510 Atherosclerotic heart disease of native coronary artery without angina pectoris: Secondary | ICD-10-CM

## 2024-02-27 DIAGNOSIS — I25119 Atherosclerotic heart disease of native coronary artery with unspecified angina pectoris: Secondary | ICD-10-CM | POA: Diagnosis not present

## 2024-02-27 DIAGNOSIS — E782 Mixed hyperlipidemia: Secondary | ICD-10-CM

## 2024-02-27 DIAGNOSIS — I10 Essential (primary) hypertension: Secondary | ICD-10-CM | POA: Diagnosis not present

## 2024-02-27 DIAGNOSIS — N1832 Chronic kidney disease, stage 3b: Secondary | ICD-10-CM

## 2024-02-27 NOTE — Progress Notes (Signed)
 Cardiology Office Note  Date: 02/27/2024   ID: [name], [name] March 06, 1946, MRN [id number]  History of Present Illness: Diana Patterson is a 78 y.o. female last seen in January by [name] NP, I reviewed her note.  She is here today with her niece. Records show hospitalization in November at [facility] for management of upper GI bleed.  She underwent small bowel enteroscopy with findings of portal hypertensive gastropathy and esophageal varices without active bleeding.  Abdominal CT showed findings suggestive of cirrhosis as well as inflammatory changes involving the stomach and colon.  She did require PRBC transfusion.  Troponin levels were found to be minimally increased and flat pattern not consistent with ACS.  Follow-up echocardiogram showed LVEF 50 to 55% without regional wall motion abnormalities.  She tells me that she presented with fatigue and shortness of breath, found to be anemic.  Had been experiencing dark stools as well.  At this point she feels much better, has a visit with GI this week in [place].  Follow-up hemoglobin up to 10.  She does not report any chest pain and states that her breathlessness has improved.  I reviewed her cardiac medications which are stable.  She is back on aspirin .  I reviewed her ECG today which shows normal sinus rhythm.  Physical Exam: VS:  BP 124/70   Pulse 85   Ht 5' 6 (1.676 m)   Wt 184 lb 3.2 oz (83.6 kg)   BMI 29.73 kg/m , BMI Body mass index is 29.73 kg/m.  Wt Readings from Last 3 Encounters:  02/27/24 184 lb 3.2 oz (83.6 kg)  02/01/24 191 lb (86.6 kg)  04/25/23 194 lb 0.1 oz (88 kg)    General: Patient appears comfortable at rest. HEENT: Conjunctiva and lids normal. Neck: Supple, no elevated JVP or carotid bruits. Lungs: Clear to auscultation, nonlabored breathing at rest. Cardiac: Regular rate and rhythm, no S3, 2/6 systolic murmur. Extremities: No pitting edema.  ECG:  An ECG dated  07/02/2022 was personally reviewed today and demonstrated:  Sinus rhythm.  Labwork: 04/12/2023: ALT 40; AST 53 04/26/2023: BUN 16; Creatinine, Ser 0.94; Hemoglobin 8.3; Platelets 119; Potassium 4.2; Sodium 138  August 2025: Cholesterol 109, triglycerides 101, HDL 33, LDL 57 December 2025: Hemoglobin 10, platelets 174, BUN 25, creatinine 1.54, GFR 34, potassium 5.3  Other Studies Reviewed Today:  Echocardiogram 01/29/2024 [facility]): Left Ventricle  Left ventricle size is normal. There is sigmoid interventricular septum. Systolic function is low normal. EF: 50-55%. Ejection fraction measured by 3D is 54%, which is normal. Wall motion is normal. Doppler parameters indicate normal diastolic function.   Right Ventricle  Right ventricle size is normal. Systolic function is normal.   Left Atrium  Left atrium size is normal.   Right Atrium  Right atrium size is normal.   Mitral Valve  Mitral valve structure is normal. There is moderate posterior annular calcification. There is no mitral regurgitation.   Tricuspid Valve  Tricuspid valve structure is normal. There is trace regurgitation. The right ventricular systolic pressure is normal (<36 mmHg).   Aortic Valve  The aortic valve is tricuspid. The leaflets are not thickened and exhibit normal excursion. There is no regurgitation or stenosis.   Pulmonic Valve  The pulmonic valve was not well visualized. Trace regurgitation. There is no evidence of pulmonic valve stenosis.   Ascending Aorta  The aortic root is normal in size. The ascending aorta is normal in size.   Pericardium  There  is no pericardial effusion.   Assessment and Plan:  1.  CAD status post DES to the circumflex in 2017 with moderate residual LAD disease managed medically.  LVEF 60 to 65% by echocardiogram in January, 50 to 55% by more recent follow-up echocardiogram in November with [facility].  She is symptomatically stable with no increasing angina or dyspnea exertion after  treatment of blood loss anemia (discussed above).  She is back on aspirin .  ECG shows normal sinus rhythm today.  Plan to continue medical therapy which also includes Lipitor  80 mg daily and as needed nitroglycerin .   2.  Primary hypertension.  Blood pressure is adequately controlled today.  Continue Coreg 3.125 mg twice daily and Hyzaar 50/12.5 mg daily.   4.  Mixed hyperlipidemia.  LDL 57 in August.  Continue Lipitor  80 mg daily.   5.  CKD stage IIIb.  Creatinine 1.54 with GFR 34.  Disposition:  Follow up 6 months.  Signed, Jayson JUDITHANN Sierras, M.D., F.A.C.C.  [facility] at [facility]

## 2024-02-27 NOTE — Patient Instructions (Addendum)

## 2024-02-29 ENCOUNTER — Encounter (INDEPENDENT_AMBULATORY_CARE_PROVIDER_SITE_OTHER): Payer: Self-pay | Admitting: Gastroenterology

## 2024-02-29 ENCOUNTER — Ambulatory Visit (INDEPENDENT_AMBULATORY_CARE_PROVIDER_SITE_OTHER): Admitting: Gastroenterology

## 2024-02-29 VITALS — BP 118/66 | HR 64 | Temp 97.9°F | Ht 66.0 in | Wt 182.3 lb

## 2024-02-29 DIAGNOSIS — K766 Portal hypertension: Secondary | ICD-10-CM | POA: Diagnosis not present

## 2024-02-29 DIAGNOSIS — K7460 Unspecified cirrhosis of liver: Secondary | ICD-10-CM | POA: Diagnosis not present

## 2024-02-29 DIAGNOSIS — K3189 Other diseases of stomach and duodenum: Secondary | ICD-10-CM | POA: Diagnosis not present

## 2024-02-29 DIAGNOSIS — D509 Iron deficiency anemia, unspecified: Secondary | ICD-10-CM | POA: Diagnosis not present

## 2024-02-29 DIAGNOSIS — D500 Iron deficiency anemia secondary to blood loss (chronic): Secondary | ICD-10-CM

## 2024-02-29 NOTE — Patient Instructions (Signed)
 It was very nice to meet you today, as dicussed with will plan for the following :  1)Liver biopsy  2) Labs and ultrasound in 4 months

## 2024-02-29 NOTE — Progress Notes (Signed)
 Hunter Bachar Faizan Adaja Wander , M.D. Gastroenterology & Hepatology [facility] [address] [place], [place] [address] Primary Care Physician: Orpha Yancey LABOR, MD [address] [place] [address]  Chief Complaint: Cirrhosis iron  deficiency anemia  History of Present Illness:  Diana Patterson is a 78 y.o. female with PMH CAD s/p stent placement, NSTEMI, DM, HLD, TIA, GERD,  who presents for evaluation of new diagnosis of cirrhosis and chronic iron  deficiency anemia  Patient had hospitalization in November 2025 at [facility] for management of upper GI bleed deemed secondary to portal hypertensive gastropathy.She underwent small bowel enteroscopy with findings of portal hypertensive gastropathy and esophageal varices without active bleeding. Abdominal CT showed findings suggestive of cirrhosis as well as inflammatory changes involving the stomach and colon.  Patient had C. difficile PCR positive and was given 10-day course of vancomycin.  Patient was last seen by Dr. Eartha in our clinic 2021 where she underwent capsule endoscopy for evaluation of normocytic anemia  Patient is currently having brown stools formed.  She takes oral iron  every day.  She has been taking carvedilol twice daily.  Never been an alcohol user.  Does report generalized itching better with moisturizer  Endoscopic history:  01/2024 enteroscopy  Small varices in the esophagus  Moderate portal hypertensive gastropathy in the fundus of the stomach,  body of the stomach and antrum  The duodenal bulb, 1st part of the duodenum, 2nd part of the duodenum, 3rd  part of the duodenum, 4th part of the duodenum, proximal jejunum and  middle jejunum appeared normal.   10/2023 [facility]   - Mild diverticulosis in the sigmoid colon. There was                         no evidence of diverticular bleeding.                         - Small lipoma in the ascending colon.                          - Redundant colon.                         - No specimens collected.   10/2023 [facility]   - Chronic gastritis, characterized by adherent blood,                         erosions and erythema. Biopsied.                         - Normal duodenal bulb, first portion of the duodenum                         and second portion of the duodenum.   [facility]/20/2019 at [facility]. This test were performed by Dr. Ellaree Bolus. 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 Colonoscopy showed grade 2 hemorrhoids without any other alteration   Capsule endoscopy 03/2020-for normocytic anemia eval   Mild gastritis (focal erythema in stomach), normal esophagus, small bowel and colon.   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 to strong positive)  Past Medical History: Past Medical History:  Diagnosis Date   Arthritis    CAD (coronary artery disease)    DES to circumflex June 2017, moderate residual LAD disease   CKD (chronic kidney disease) stage 3, GFR 30-59 ml/min (HCC)    Essential hypertension    Mixed hyperlipidemia    Muscle cramps at night    NSTEMI (non-ST elevated myocardial infarction) (HCC) 2016   Probable type II event, negative Cardiolite 2012   Sleep apnea    no onger uses cpap   TIA (transient ischemic attack)     Past Surgical History: Past Surgical History:  Procedure Laterality Date   CARDIAC CATHETERIZATION N/A 08/23/2015   Procedure: Left Heart Cath and Coronary Angiography;  Surgeon: Alm LELON Clay, MD;  Location: [facility] INVASIVE CV LAB;  Service: Cardiovascular;  Laterality: N/A;   CARDIAC CATHETERIZATION N/A 08/23/2015   Procedure: Coronary Stent  Intervention;  Surgeon: Alm LELON Clay, MD;  Location: [facility] INVASIVE CV LAB;  Service: Cardiovascular;  Laterality: N/A;   CHOLECYSTECTOMY     EYE SURGERY  1990s   GIVENS CAPSULE STUDY N/A 04/01/2020   Procedure: GIVENS CAPSULE STUDY;  Surgeon: Eartha Angelia Sieving, MD;  Location: [facility];  Service: Gastroenterology;  Laterality: N/A;  7:30   KNEE ARTHROPLASTY Bilateral    TOTAL HIP ARTHROPLASTY Right 02/09/2021   Procedure: TOTAL HIP ARTHROPLASTY ANTERIOR APPROACH;  Surgeon: Melodi Lerner, MD;  Location: [facility] ORS;  Service: Orthopedics;  Laterality: Right;   TOTAL HIP ARTHROPLASTY Left 04/25/2023   Procedure: TOTAL HIP ARTHROPLASTY ANTERIOR APPROACH;  Surgeon: Melodi Lerner, MD;  Location: [facility] ORS;  Service: Orthopedics;  Laterality: Left;   Tubal pregnancy removal      Family History: Family History  Problem Relation Age of Onset   Heart attack Father    Diabetes Sister     Social History: Social History   Tobacco Use  Smoking Status Former   Current packs/day: 0.00   Average packs/day: 1 pack/day for 20.0 years (20.0 ttl pk-yrs)   Types: Cigarettes   Start date: 03/22/1958   Quit date: 03/22/1978   Years since quitting: 45.9  Smokeless Tobacco Never   Social History   Substance and Sexual Activity  Alcohol Use No   Alcohol/week: 0.0 standard drinks of alcohol   Social History   Substance and Sexual Activity  Drug Use No    Allergies: Allergies  Allergen Reactions   Morphine  And Codeine Other (See Comments)    Drowsiness    Medications: Current Outpatient Medications  Medication Sig Dispense Refill   acetaminophen  (TYLENOL ) 650 MG CR tablet Take 1,300 mg by mouth in the morning and at bedtime.     aspirin  EC 81 MG tablet Take 81 mg by mouth daily. Swallow whole.     atorvastatin  (LIPITOR ) 80 MG tablet Take 1 tablet (80 mg total) by mouth daily at 6 PM. 30 tablet 5   carvedilol (COREG) 3.125 MG tablet Take 3.125 mg by mouth 2 (two) times daily with a meal.      Choline Fenofibrate  135 MG capsule Take 135 mg by mouth daily in the afternoon.     clotrimazole-betamethasone (LOTRISONE) cream Apply 1 application  topically daily as needed (irritation).     diclofenac Sodium (VOLTAREN) 1 % GEL Apply 4 g topically 4 (four) times daily.     diphenoxylate-atropine (LOMOTIL) 2.5-0.025  MG tablet Take 1 tablet by mouth 3 (three) times daily as needed for diarrhea or loose stools.     folic acid  (FOLVITE ) 1 MG tablet Take 1 mg by mouth See admin instructions. Take 1 mg by mouth daily except for Saturday     furosemide  (LASIX ) 20 MG tablet Take 20 mg by mouth daily with breakfast.     iron  polysaccharides (NIFEREX) 150 MG capsule Take 150 mg by mouth 2 (two) times daily.     levocetirizine (XYZAL) 5 MG tablet Take 5 mg by mouth every evening.     losartan -hydrochlorothiazide (HYZAAR) 50-12.5 MG tablet Take 1 tablet by mouth daily.     Magnesium  200 MG TABS Take 2 tablets by mouth daily.     meclizine (ANTIVERT) 25 MG tablet Take 25 mg by mouth every 6 (six) hours as needed for dizziness.     methotrexate (RHEUMATREX) 2.5 MG tablet Take 20 mg by mouth every Saturday.     metoprolol  tartrate (LOPRESSOR ) 50 MG tablet Take 25 mg by mouth daily.     montelukast  (SINGULAIR ) 10 MG tablet Take 10 mg by mouth daily in the afternoon.     nitroGLYCERIN  (NITROSTAT ) 0.4 MG SL tablet Place 1 tablet (0.4 mg total) under the tongue every 5 (five) minutes x 3 doses as needed for chest pain (if no relief after 2nd dose, proceed to ED or call 911). 25 tablet 3   nystatin (MYCOSTATIN/NYSTOP) powder Apply 1 Application topically 4 (four) times daily as needed (for yeast).     Omega-3 1000 MG CAPS Take 2 g by mouth daily.     potassium chloride  SA (KLOR-CON  M) 20 MEQ tablet Take 20 mEq by mouth daily.     traMADol  (ULTRAM ) 50 MG tablet Take 1-2 tablets (50-100 mg total) by mouth every 6 (six) hours as needed for moderate pain (pain score 4-6). 40 tablet 0   valACYclovir  (VALTREX ) 500  MG tablet Take 500 mg by mouth daily.     No current facility-administered medications for this visit.    Review of Systems: GENERAL: negative for malaise, night sweats HEENT: No changes in hearing or vision, no nose bleeds or other nasal problems. NECK: Negative for lumps, goiter, pain and significant neck swelling RESPIRATORY: Negative for cough, wheezing CARDIOVASCULAR: Negative for chest pain, leg swelling, palpitations, orthopnea GI: SEE HPI MUSCULOSKELETAL: Negative for joint pain or swelling, back pain, and muscle pain. SKIN: Negative for lesions, rash HEMATOLOGY Negative for prolonged bleeding, bruising easily, and swollen nodes. ENDOCRINE: Negative for cold or heat intolerance, polyuria, polydipsia and goiter. NEURO: negative for tremor, gait imbalance, syncope and seizures. The remainder of the review of systems is noncontributory.   Physical Exam: BP 118/66   Pulse 64   Temp 97.9 F (36.6 C)   Ht 5' 6 (1.676 m)   Wt 182 lb 4.8 oz (82.7 kg)   BMI 29.42 kg/m  GENERAL: The patient is AO x3, in no acute distress. HEENT: Head is normocephalic and atraumatic. EOMI are intact. Mouth is well hydrated and without lesions. NECK: Supple. No masses LUNGS: Clear to auscultation. No presence of rhonchi/wheezing/rales. Adequate chest expansion HEART: RRR, normal s1 and s2. ABDOMEN: Soft, nontender, no guarding, no peritoneal signs, and nondistended. BS +. No masses.  Imaging/Labs: as above     Latest Ref Rng & Units 04/26/2023    3:32 AM 04/12/2023   11:51 AM 12/29/2022    2:40 PM  CBC  WBC 4.0 - 10.5 K/uL 9.3  5.8  4.2   Hemoglobin 12.0 - 15.0 g/dL 8.3  89.5  9.3   Hematocrit 36.0 - 46.0 % 26.0  32.0  28.0   Platelets 150 - 400 K/uL 119  204  197    No results found for: IRON , TIBC, FERRITIN  I personally reviewed and interpreted the available labs, imaging and endoscopic files.  01/2024 CT  IMPRESSION:  1. Possible mild inflammatory changes of the stomach and  colon. Findings may relate to a nonspecific gastritis/colitis.  2.  Atherosclerosis including coronary artery disease.  3.  Mild cirrhotic change of the liver with hepatic steatosis.  4.  Mild splenomegaly.  5.  Cholecystectomy changes.  6.  Colonic diverticulosis.  7.  Bilateral renal cortical scarring with lobulated contours. Nonobstructing nephrolithiasis.   Impression and Plan:  Marzell Allemand is a 78 y.o. 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)  # Hepatic encephalopathy None on exam - Avoid opiates or benzodiazepines   # Ascites None on exam and recent imaging  # Esophageal varices None on recent upper endoscopy  # HCC screening - Last abdominal imaging 01/2024 - Last AFP none  Recommendation   -Recommend vaccination against hepatitis B with PCP -Since there is questionable autoimmune hepatitis (AIP) given positive anti-smooth muscle antibody I discussed with patient indication for liver biopsy because if she truly has AIP with cirrhosis regardless of liver enzymes she would be a candidate for treatment to prevent further damage and decompensation to the liver - Check CBC, MELD labs and AFP - Schedule liver US  every 6 months - Reduce salt intake to <2 g per day - Can take Tylenol  max of 2 g per day (650 mg q8h) for pain - Avoid NSAIDs for pain - Avoid eating raw oysters/shellfish - Protein shake (Ensure or Boost) every night before going to sleep - Continue  carvedilol 3.125 twice daily and uptitrate to 6.25 mg twice daily  #Iron  deficiency anemia  Patient had a bidirectional endoscopy 10/2023 with Dr.Cathy at [facility] and recently enteroscopy 10/31/2023 Recent evaluation for melena suspected secondary to portal hypertensive gastropathy Patient rather has chronic history of iron  deficiency anemia Capsule endoscopy performed 2021 Iron  saturation 6% indicating iron  deficiency -ferritin 58  I recommend patient to continue oral iron  every 48 hours and follow-up with her hematologist at [facility] for continued iron  infusion  PPI daily  Depending on response to iron  infusion will assess for repeat upper endoscopy colonoscopy in future   All questions were answered.      Liseth Wann Faizan Tajanay Hurley, MD Gastroenterology and Hepatology [facility]   This chart has been completed using [facility] Dictation software, and while attempts have been made to ensure accuracy , certain words and phrases may not be transcribed as intended

## 2024-03-02 ENCOUNTER — Other Ambulatory Visit (INDEPENDENT_AMBULATORY_CARE_PROVIDER_SITE_OTHER): Payer: Self-pay

## 2024-03-02 ENCOUNTER — Telehealth (INDEPENDENT_AMBULATORY_CARE_PROVIDER_SITE_OTHER): Payer: Self-pay

## 2024-03-02 DIAGNOSIS — K7460 Unspecified cirrhosis of liver: Secondary | ICD-10-CM

## 2024-03-02 NOTE — Telephone Encounter (Signed)
 Spoke with patient, scheduled ultrasound in 4 months on 06/29/2024 at 9:30 am (NPO midnight). Patient aware and verbalized understanding.

## 2024-03-06 ENCOUNTER — Telehealth (HOSPITAL_BASED_OUTPATIENT_CLINIC_OR_DEPARTMENT_OTHER): Payer: Self-pay

## 2024-03-06 NOTE — Telephone Encounter (Signed)
° °  Pre-operative Risk Assessment    Patient Name: Marquisa Salih  DOB: 15-Aug-1945 MRN: [id number]   Date of last office visit: 02/27/2024 with Dr. Debera Date of next office visit: None  Request for Surgical Clearance    Procedure:  US  liver biopsy  Date of Surgery:  Clearance TBD                                 Surgeon:  Does not specify Surgeon's Group or Practice Name:  [facility] Radiology Department  Phone number:  [phone] Fax number:  [phone]   Type of Clearance Requested:   - Medical  - Pharmacy:  Hold Aspirin  -5 days prior    Type of Anesthesia:  moderate sedation   Additional requests/questions:  None  Signed, Patrcia Iverson CROME   03/06/2024, 8:20 AM

## 2024-03-06 NOTE — Telephone Encounter (Signed)
° °  Patient Name: Diana Patterson  DOB: 1945-06-06 MRN: [id number]  Primary Cardiologist: Jayson Sierras, MD  Chart reviewed as part of pre-operative protocol coverage. Pre-op clearance already addressed by colleagues in earlier phone notes. To summarize recommendations:  -She was clinically stable from a cardiac perspective at recent visit. Ultrasound-guided liver biopsy with moderate sedation should be a relatively low risk endeavor from a cardiovascular perspective. They can certainly hold aspirin  7 days if needed.  -Dr. Sierras  Will route this bundled recommendation to requesting provider via Epic fax function and remove from pre-op pool. Please call with questions.  [name], PA-C 03/06/2024, 3:52 PM

## 2024-03-26 DIAGNOSIS — K7460 Unspecified cirrhosis of liver: Secondary | ICD-10-CM

## 2024-03-26 NOTE — H&P (Signed)
 "  Chief Complaint: Cirrhosis, hepatic steatosis, positive anti smooth muscle antibody; referred for image guided random core liver biopsy for further evaluation  Referring Provider(s): Cinderella, M  Supervising Physician: Luverne Aran  Patient Status: [facility] - Out-pt  History of Present Illness: Diana Patterson is a 79 y.o. female ex smoker with PMH sig for CAD s/p stent placement, CKD, HTN, sleep apnea, diverticulosis, NSTEMI, DM, HLD, TIA, GERD, rheumatoid arthritis on methotrexate as well as new diagnosis of cirrhosis, hepatic steatosis and chronic iron  deficiency anemia along with + ASMA. She is scheduled today for image guided random core liver biopsy for further evaluation.   *** Patient is Full Code  Past Medical History:  Diagnosis Date   Arthritis    CAD (coronary artery disease)    DES to circumflex June 2017, moderate residual LAD disease   CKD (chronic kidney disease) stage 3, GFR 30-59 ml/min (HCC)    Essential hypertension    Mixed hyperlipidemia    Muscle cramps at night    NSTEMI (non-ST elevated myocardial infarction) (HCC) 2016   Probable type II event, negative Cardiolite 2012   Sleep apnea    no onger uses cpap   TIA (transient ischemic attack)     Past Surgical History:  Procedure Laterality Date   CARDIAC CATHETERIZATION N/A 08/23/2015   Procedure: Left Heart Cath and Coronary Angiography;  Surgeon: Alm LELON Clay, MD;  Location: [facility] INVASIVE CV LAB;  Service: Cardiovascular;  Laterality: N/A;   CARDIAC CATHETERIZATION N/A 08/23/2015   Procedure: Coronary Stent Intervention;  Surgeon: Alm LELON Clay, MD;  Location: [facility] INVASIVE CV LAB;  Service: Cardiovascular;  Laterality: N/A;   CHOLECYSTECTOMY     EYE SURGERY  1990s   GIVENS CAPSULE STUDY N/A 04/01/2020   Procedure: GIVENS CAPSULE STUDY;  Surgeon: Eartha Angelia Sieving, MD;  Location: [facility] ENDO SUITE;  Service: Gastroenterology;  Laterality: N/A;  7:30   KNEE ARTHROPLASTY Bilateral    TOTAL HIP  ARTHROPLASTY Right 02/09/2021   Procedure: TOTAL HIP ARTHROPLASTY ANTERIOR APPROACH;  Surgeon: Melodi Lerner, MD;  Location: [facility] ORS;  Service: Orthopedics;  Laterality: Right;   TOTAL HIP ARTHROPLASTY Left 04/25/2023   Procedure: TOTAL HIP ARTHROPLASTY ANTERIOR APPROACH;  Surgeon: Melodi Lerner, MD;  Location: [facility] ORS;  Service: Orthopedics;  Laterality: Left;   Tubal pregnancy removal      Allergies: Morphine  and codeine  Medications: Prior to Admission medications  Medication Sig Start Date End Date Taking? Authorizing Provider  acetaminophen  (TYLENOL ) 650 MG CR tablet Take 1,300 mg by mouth in the morning and at bedtime.    [provider]  aspirin  EC 81 MG tablet Take 81 mg by mouth daily. Swallow whole.    [provider]  atorvastatin  (LIPITOR ) 80 MG tablet Take 1 tablet (80 mg total) by mouth daily at 6 PM. 08/26/15   Marcine, Caffie HERO, PA-C  carvedilol (COREG) 3.125 MG tablet Take 3.125 mg by mouth 2 (two) times daily with a meal.    [provider]  Choline Fenofibrate  135 MG capsule Take 135 mg by mouth daily in the afternoon.    [provider]  clotrimazole-betamethasone (LOTRISONE) cream Apply 1 application  topically daily as needed (irritation).    [provider]  diclofenac Sodium (VOLTAREN) 1 % GEL Apply 4 g topically 4 (four) times daily.    [provider]  diphenoxylate-atropine (LOMOTIL) 2.5-0.025 MG tablet Take 1 tablet by mouth 3 (three) times daily as needed for diarrhea or loose stools.  [provider]  folic acid  (FOLVITE ) 1 MG tablet Take 1 mg by mouth See admin instructions. Take 1 mg by mouth daily except for Saturday    [provider]  furosemide  (LASIX ) 20 MG tablet Take 20 mg by mouth daily with breakfast.    [provider]  iron  polysaccharides (NIFEREX) 150 MG capsule Take 150 mg by mouth 2 (two) times daily.    [provider]  levocetirizine (XYZAL) 5 MG tablet  Take 5 mg by mouth every evening.    [provider]  losartan -hydrochlorothiazide (HYZAAR) 50-12.5 MG tablet Take 1 tablet by mouth daily.    [provider]  Magnesium  200 MG TABS Take 2 tablets by mouth daily.    [provider]  meclizine (ANTIVERT) 25 MG tablet Take 25 mg by mouth every 6 (six) hours as needed for dizziness. 11/16/20   [provider]  methotrexate (RHEUMATREX) 2.5 MG tablet Take 20 mg by mouth every Saturday.    [provider]  metoprolol  tartrate (LOPRESSOR ) 50 MG tablet Take 25 mg by mouth daily.    [provider]  montelukast  (SINGULAIR ) 10 MG tablet Take 10 mg by mouth daily in the afternoon.    [provider]  nitroGLYCERIN  (NITROSTAT ) 0.4 MG SL tablet Place 1 tablet (0.4 mg total) under the tongue every 5 (five) minutes x 3 doses as needed for chest pain (if no relief after 2nd dose, proceed to ED or call 911). 04/12/23   Miriam Norris, NP  nystatin (MYCOSTATIN/NYSTOP) powder Apply 1 Application topically 4 (four) times daily as needed (for yeast).    [provider]  Omega-3 1000 MG CAPS Take 2 g by mouth daily.    [provider]  potassium chloride  SA (KLOR-CON  M) 20 MEQ tablet Take 20 mEq by mouth daily. 04/27/21   [provider]  traMADol  (ULTRAM ) 50 MG tablet Take 1-2 tablets (50-100 mg total) by mouth every 6 (six) hours as needed for moderate pain (pain score 4-6). 04/26/23   Kristian Stabs, PA  valACYclovir  (VALTREX ) 500 MG tablet Take 500 mg by mouth daily.    [provider]     Family History  Problem Relation Age of Onset   Heart attack Father    Diabetes Sister     Social History   Socioeconomic History   Marital status: Widowed    Spouse name: Not on file   Number of children: Not on file   Years of education: Not on file   Highest education level: Not on file  Occupational History   Not on file  Tobacco Use   Smoking status: Former     Current packs/day: 0.00    Average packs/day: 1 pack/day for 20.0 years (20.0 ttl pk-yrs)    Types: Cigarettes    Start date: 03/22/1958    Quit date: 03/22/1978    Years since quitting: 46.0   Smokeless tobacco: Never  Vaping Use   Vaping status: Never Used  Substance and Sexual Activity   Alcohol use: No    Alcohol/week: 0.0 standard drinks of alcohol   Drug use: No   Sexual activity: Not on file  Other Topics Concern   Not on file  Social History Narrative   Has 5 children   Social Drivers of Health   Tobacco Use: Low Risk (03/12/2024)   Received [facility]   Patient History    Smoking Tobacco Use: Never    Smokeless Tobacco Use: Never  Passive Exposure: Not on file  Recent Concern: Tobacco Use - Medium Risk (02/29/2024)   Patient History    Smoking Tobacco Use: Former    Smokeless Tobacco Use: Never    Passive Exposure: Not on file  Financial Resource Strain: Low Risk (04/15/2021)   Received from [facility]   Overall Financial Resource Strain (CARDIA)    Difficulty of Paying Living Expenses: Not hard at all  Food Insecurity: No Food Insecurity (02/01/2024)   Epic    Worried About Programme Researcher, Broadcasting/film/video in the Last Year: Never true    Ran Out of Food in the Last Year: Never true  Transportation Needs: No Transportation Needs (02/01/2024)   Epic    Lack of Transportation (Medical): No    Lack of Transportation (Non-Medical): No  Physical Activity: Inactive (04/15/2021)   Received from [facility]   Exercise Vital Sign    On average, how many days per week do you engage in moderate to strenuous exercise (like a brisk walk)?: 0 days    On average, how many minutes do you engage in exercise at this level?: 0 min  Stress: No Stress Concern Present (01/27/2024)   Received from [facility] - Occupational Stress Questionnaire    Do you feel stress - tense, restless, nervous, or anxious, or unable to sleep at night  because your mind is troubled all the time - these days?: Only a little  Social Connections: Moderately Isolated (04/15/2021)   Received from [facility]   Social Connection and Isolation Panel    In a typical week, how many times do you talk on the phone with family, friends, or neighbors?: More than three times a week    How often do you get together with friends or relatives?: Twice a week    How often do you attend church or religious services?: More than 4 times per year    Do you belong to any clubs or organizations such as church groups, unions, fraternal or athletic groups, or school groups?: No    How often do you attend meetings of the clubs or organizations you belong to?: Never    Are you married, widowed, divorced, separated, never married, or living with a partner?: Widowed  Depression (PHQ2-9): Low Risk (02/01/2024)   Depression (PHQ2-9)    PHQ-2 Score: 0  Alcohol Screen: Not on file  Housing: Low Risk (02/01/2024)   Epic    Unable to Pay for Housing in the Last Year: No    Number of Times Moved in the Last Year: 0    Homeless in the Last Year: No  Utilities: Not At Risk (02/01/2024)   Epic    Threatened with loss of utilities: No  Health Literacy: Medium Risk (04/15/2021)   Received from [facility] Literacy    How often do you need to have someone help you when you read instructions, pamphlets, or other written material from your doctor or pharmacy?: Rarely       Review of Systems  Vital Signs:   Advance Care Plan:no documents on file    Physical Exam  Imaging: No results found.  Labs:  CBC: Recent Labs    04/12/23 1151 04/26/23 0332  WBC 5.8 9.3  HGB 10.4* 8.3*  HCT 32.0* 26.0*  PLT 204 119*    COAGS: No results for input(s): INR, APTT in the last 8760 hours.  BMP: Recent Labs  04/12/23 1151 04/26/23 0332  NA 139 138  K 4.7 4.2  CL 110 108  CO2 21* 22  GLUCOSE 116* 143*  BUN 17 16  CALCIUM  9.1 8.6*   CREATININE 1.04* 0.94  GFRNONAA 55* >60    LIVER FUNCTION TESTS: Recent Labs    04/12/23 1151  BILITOT 1.0  AST 53*  ALT 40  ALKPHOS 147*  PROT 7.0  ALBUMIN  3.6    TUMOR MARKERS: No results for input(s): AFPTM, CEA, CA199, CHROMGRNA in the last 8760 hours.  Assessment and Plan: 79 y.o. female ex smoker with PMH sig for CAD s/p stent placement, CKD, HTN, sleep apnea, NSTEMI, diverticulosis, DM, HLD, TIA, GERD, rheumatoid arthritis on methotrexate as well as new diagnosis of cirrhosis, hepatic steatosis and chronic iron  deficiency anemia along with + ASMA. She is scheduled today for image guided random core liver biopsy for further evaluation.Risks and benefits of procedure was discussed with the patient  including, but not limited to bleeding, infection, damage to adjacent structures or low yield requiring additional tests.  All of the questions were answered and there is agreement to proceed.  Consent signed and in chart.    Thank you for allowing our service to participate in Diana Patterson 's care.  Electronically Signed: D. Franky Rakers, PA-C   03/26/2024, 2:52 PM      I spent a total of  25 minutes   in face to face in clinical consultation, greater than 50% of which was counseling/coordinating care for image guided random core liver biopsy   "

## 2024-03-28 ENCOUNTER — Ambulatory Visit (HOSPITAL_COMMUNITY)
Admission: RE | Admit: 2024-03-28 | Discharge: 2024-03-28 | Disposition: A | Discharge status: Home / Self Care | Source: Ambulatory Visit | Attending: Gastroenterology | Admitting: Gastroenterology

## 2024-03-28 ENCOUNTER — Other Ambulatory Visit: Payer: Self-pay

## 2024-03-28 ENCOUNTER — Encounter (HOSPITAL_COMMUNITY): Payer: Self-pay

## 2024-03-28 DIAGNOSIS — K7581 Nonalcoholic steatohepatitis (NASH): Secondary | ICD-10-CM | POA: Diagnosis present

## 2024-03-28 DIAGNOSIS — K739 Chronic hepatitis, unspecified: Secondary | ICD-10-CM | POA: Diagnosis present

## 2024-03-28 DIAGNOSIS — K760 Fatty (change of) liver, not elsewhere classified: Secondary | ICD-10-CM | POA: Diagnosis present

## 2024-03-28 DIAGNOSIS — K7469 Other cirrhosis of liver: Secondary | ICD-10-CM | POA: Diagnosis not present

## 2024-03-28 DIAGNOSIS — K7460 Unspecified cirrhosis of liver: Secondary | ICD-10-CM

## 2024-03-28 LAB — CBC WITH DIFFERENTIAL/PLATELET
Abs Immature Granulocytes: 0.01 K/uL (ref 0.00–0.07)
Basophils Absolute: 0 K/uL (ref 0.0–0.1)
Basophils Relative: 1 %
Eosinophils Absolute: 0.1 K/uL (ref 0.0–0.5)
Eosinophils Relative: 2 %
HCT: 30.1 % — ABNORMAL LOW (ref 36.0–46.0)
Hemoglobin: 10 g/dL — ABNORMAL LOW (ref 12.0–15.0)
Immature Granulocytes: 0 %
Lymphocytes Relative: 17 %
Lymphs Abs: 0.6 K/uL — ABNORMAL LOW (ref 0.7–4.0)
MCH: 30.2 pg (ref 26.0–34.0)
MCHC: 33.2 g/dL (ref 30.0–36.0)
MCV: 90.9 fL (ref 80.0–100.0)
Monocytes Absolute: 0.7 K/uL (ref 0.1–1.0)
Monocytes Relative: 18 %
Neutro Abs: 2.3 K/uL (ref 1.7–7.7)
Neutrophils Relative %: 62 %
Platelets: 127 K/uL — ABNORMAL LOW (ref 150–400)
RBC: 3.31 MIL/uL — ABNORMAL LOW (ref 3.87–5.11)
RDW: 21.7 % — ABNORMAL HIGH (ref 11.5–15.5)
Smear Review: NORMAL
WBC: 3.6 K/uL — ABNORMAL LOW (ref 4.0–10.5)
nRBC: 0 % (ref 0.0–0.2)

## 2024-03-28 LAB — COMPREHENSIVE METABOLIC PANEL WITH GFR
ALT: 31 U/L (ref 0–44)
AST: 60 U/L — ABNORMAL HIGH (ref 15–41)
Albumin: 3.7 g/dL (ref 3.5–5.0)
Alkaline Phosphatase: 94 U/L (ref 38–126)
Anion gap: 9 (ref 5–15)
BUN: 20 mg/dL (ref 8–23)
CO2: 23 mmol/L (ref 22–32)
Calcium: 9.7 mg/dL (ref 8.9–10.3)
Chloride: 108 mmol/L (ref 98–111)
Creatinine, Ser: 1.42 mg/dL — ABNORMAL HIGH (ref 0.44–1.00)
GFR, Estimated: 37 mL/min — ABNORMAL LOW
Glucose, Bld: 101 mg/dL — ABNORMAL HIGH (ref 70–99)
Potassium: 4.8 mmol/L (ref 3.5–5.1)
Sodium: 140 mmol/L (ref 135–145)
Total Bilirubin: 0.7 mg/dL (ref 0.0–1.2)
Total Protein: 7 g/dL (ref 6.5–8.1)

## 2024-03-28 LAB — PROTIME-INR
INR: 1.1 (ref 0.8–1.2)
Prothrombin Time: 15.3 s — ABNORMAL HIGH (ref 11.4–15.2)

## 2024-03-28 MED ORDER — SODIUM CHLORIDE 0.9 % IV SOLN: INTRAVENOUS | Status: DC

## 2024-03-28 MED ORDER — LIDOCAINE HCL 1 % IJ SOLN
INTRAMUSCULAR | Status: AC
  Filled 2024-03-28: qty 20

## 2024-03-28 MED ORDER — FENTANYL CITRATE (PF) 100 MCG/2ML IJ SOLN
INTRAMUSCULAR | Status: AC | PRN
  Administered 2024-03-28: 50 ug via INTRAVENOUS

## 2024-03-28 MED ORDER — MIDAZOLAM HCL (PF) 2 MG/2ML IJ SOLN
INTRAMUSCULAR | Status: AC | PRN
  Administered 2024-03-28: 1 mg via INTRAVENOUS

## 2024-03-28 MED ORDER — MIDAZOLAM HCL 2 MG/2ML IJ SOLN
INTRAMUSCULAR | Status: AC
  Filled 2024-03-28: qty 2

## 2024-03-28 MED ORDER — FENTANYL CITRATE (PF) 100 MCG/2ML IJ SOLN
INTRAMUSCULAR | Status: AC
  Filled 2024-03-28: qty 2

## 2024-03-28 NOTE — Procedures (Signed)
 Interventional Radiology Procedure Note  Procedure: US Guided Biopsy of liver  Complications: None  Estimated Blood Loss: < 10 mL  Findings: 18 G core biopsy of liver performed under US guidance.  Three core samples obtained and sent to Pathology.  Jodi Marble. Fredia Sorrow, M.D Pager:  [phone]

## 2024-03-28 NOTE — Discharge Instructions (Addendum)
Discharge Instructions:   Please call Interventional Radiology clinic [phone] with any questions or concerns.  You may remove your dressing and shower tomorrow.  Liver Biopsy, Care After The following information offers guidance on how to care for yourself after your procedure. Your health care provider may also give you more specific instructions. If you have problems or questions, contact your health care provider. What can I expect after the procedure? After your procedure, it is common to: Have pain and soreness in the area where the biopsy was done. Have bruising around the area where the biopsy was done. Feel tired for 1-2 days. Follow these instructions at home: Medicines Take over-the-counter and prescription medicines only as told by your health care provider. If you were prescribed an antibiotic medicine, take it as told by your health care provider. Do not stop taking the antibiotic, even if you start to feel better. Do not take medicines, such as aspirin and ibuprofen, unless your doctor tells you to take them. Ask your health care provider if the medicine prescribed to you: Requires you to avoid driving or using machinery. Can cause constipation. You may need to take these actions to prevent or treat constipation: Drink enough fluid to keep your urine pale yellow. Take over-the-counter or prescription medicines. Eat foods that are high in fiber, such as beans, whole grains, and fresh fruits and vegetables. Limit foods that are high in fat and processed sugars, such as fried or sweet foods. Incision care Follow instructions from your health care provider about how to take care of your incisions. Make sure you: Wash your hands with soap and water for at least 20 seconds before and after you change your bandage (dressing). If soap and water are not available, use hand sanitizer. Change your dressing as told by your health care provider. Leave stitches (sutures), skin glue,  or adhesive strips in place. These skin closures may need to stay in place for 2 weeks or longer. If adhesive strip edges start to loosen and curl up, you may trim the loose edges. Do not remove adhesive strips completely unless your health care provider tells you to do that. Check your incision areas every day for signs of infection. Check for: Redness, swelling, or more pain. Fluid or blood. Warmth. Pus or a bad smell. Do not take baths, swim, or use a hot tub until your health care provider says it is okay to do so. Activity Rest at home for 1-2 days, or as directed by your health care provider. Avoid sitting for a long time without moving. Get up to take short walks every 1-2 hours. This is important to improve blood flow and breathing. Ask for help if you feel weak or unsteady. Do not lift anything that is heavier than 10 lb (4.5 kg), or the limit that your health care provider tells you, until he or she says that it is safe. Do not play contact sports for 2 weeks after the procedure. Return to your normal activities as told by your health care provider. Ask your health care provider what activities are safe for you. General instructions  Do not drink alcohol in the first week after the procedure. Plan to have a responsible adult care for you for the time you are told after you leave the hospital or clinic. This is important. It is up to you to get the results of your procedure. Ask your health care provider, or the department that is doing the procedure, when your results will  be ready. Keep all follow-up visits. This is important. Contact a health care provider if: You have increased bleeding from an incision. You have redness, swelling, or increasing pain in any incisions. You notice a discharge or a bad smell coming from any of your incisions. You develop a rash. You have a fever or chills. Get help right away if: You develop swelling, bloating, or pain in your abdomen. You become  dizzy or faint. You have nausea or vomiting. You have shortness of breath or difficulty breathing. You develop chest pain. You have problems with your speech or vision. You have trouble with your balance or moving your arms or legs. These symptoms may represent a serious problem that is an emergency. Do not wait to see if the symptoms will go away. Get medical help right away. Call your local emergency services (911 in the [place] Do not drive yourself to the hospital. Summary After the liver biopsy, it is common to have pain, soreness, and bruising in the area, as well as tiredness (fatigue). Take over-the-counter and prescription medicines only as told by your health care provider. Follow instructions from your health care provider about how to care for your incisions. Check your incision areas daily for signs of infection. This information is not intended to replace advice given to you by your health care provider. Make sure you discuss any questions you have with your health care provider. Document Revised: 01/21/2020 Document Reviewed: 01/21/2020 Elsevier Patient Education  [place].    Moderate Conscious Sedation, Adult, Care After This sheet gives you information about how to care for yourself after your procedure. Your health care provider may also give you more specific instructions. If you have problems or questions, contact your health care provider. What can I expect after the procedure? After the procedure, it is common to have: Sleepiness for several hours. Impaired judgment for several hours. Difficulty with balance. Vomiting if you eat too soon. Follow these instructions at home: For the time period you were told by your health care provider:     Rest. Do not participate in activities where you could fall or become injured. Do not drive or use machinery. Do not drink alcohol. Do not take sleeping pills or medicines that cause drowsiness. Do not make  important decisions or sign legal documents. Do not take care of children on your own. Eating and drinking  Follow the diet recommended by your health care provider. Drink enough fluid to keep your urine pale yellow. If you vomit: Drink water, juice, or soup when you can drink without vomiting. Make sure you have little or no nausea before eating solid foods. General instructions Take over-the-counter and prescription medicines only as told by your health care provider. Have a responsible adult stay with you for the time you are told. It is important to have someone help care for you until you are awake and alert. Do not smoke. Keep all follow-up visits as told by your health care provider. This is important. Contact a health care provider if: You are still sleepy or having trouble with balance after 24 hours. You feel light-headed. You keep feeling nauseous or you keep vomiting. You develop a rash. You have a fever. You have redness or swelling around the IV site. Get help right away if: You have trouble breathing. You have new-onset confusion at home. Summary After the procedure, it is common to feel sleepy, have impaired judgment, or feel nauseous if you eat too soon. Rest after  you get home. Know the things you should not do after the procedure. Follow the diet recommended by your health care provider and drink enough fluid to keep your urine pale yellow. Get help right away if you have trouble breathing or new-onset confusion at home. This information is not intended to replace advice given to you by your health care provider. Make sure you discuss any questions you have with your health care provider. Document Revised: 07/06/2019 Document Reviewed: 02/01/2019 Elsevier Patient Education  [place].

## 2024-04-02 LAB — SURGICAL PATHOLOGY

## 2024-04-09 ENCOUNTER — Ambulatory Visit (INDEPENDENT_AMBULATORY_CARE_PROVIDER_SITE_OTHER): Payer: Self-pay | Admitting: Gastroenterology

## 2024-04-09 NOTE — Progress Notes (Signed)
 Liver biopsy :   A. LIVER, RIGHT LOBE, BIOPSY: - Mixed chronic hepatitis and steatohepatitis pattern with focal bridging fibrosis suggestive of early cirrhosis, see note.  Note: The liver parenchyma has abundant glycogenated nuclei with rare ballooning degeneration without evidence of steatosis morphologically, the findings are indicative of underlying previous steatosis or burned-out steatosis.  Some of the portal tracts have a moderate amount of chronic inflammation with evidence of periportal inflammation and interface hepatitis.  In addition, there are a few scattered plasma cells as well as a rare eosinophil present.  There is evidence of past injury including the presence of ceroid histiocytes predominantly periportal indicative of resolving/resolved injury.  This is overall in a patchy pattern with some portal triads with no significant inflammation.  A trichrome stain shows focal bridging type fibrosis as well as some periportal fibrosis with focal evidence of pericellular/perisinusoidal fibrosis but in a periportal location as opposed to a central vein location.  The overall fibrosis pattern appears somewhat resolving.  The overall pattern of injury has overlapping features suggestive of both a steatohepatitis and a chronic hepatitis.  It is noted that while there is no elevated ANA, there is a mildly elevated ASMA (35) the pattern is most consistent with a combination of a drug-induced liver injury with autoimmune pattern and steatohepatitis as opposed to autoimmune hepatitis.  The overall grade per Luciano and Dianne is 2 of 4 are necroinflammatory grade per [place] of 2 of 3 with a stage by both Luciano and Dianne and [place] as 3 of 4.  Dr. Macie has peer reviewed the case and agrees with the interpretation. Clinical correlation necessary.

## 2024-04-23 ENCOUNTER — Other Ambulatory Visit (HOSPITAL_BASED_OUTPATIENT_CLINIC_OR_DEPARTMENT_OTHER): Payer: Self-pay

## 2024-05-02 ENCOUNTER — Encounter (INDEPENDENT_AMBULATORY_CARE_PROVIDER_SITE_OTHER): Admitting: Gastroenterology

## 2024-06-29 ENCOUNTER — Other Ambulatory Visit (HOSPITAL_COMMUNITY)
